# Patient Record
Sex: Male | Born: 1955 | Race: White | Hispanic: No | Marital: Married | Smoking: Former smoker
Health system: Southern US, Community
[De-identification: ages and names within clinical notes are randomized; demographics above are authoritative.]

## PROBLEM LIST (undated history)

## (undated) DIAGNOSIS — A020 Salmonella enteritis: Secondary | ICD-10-CM

## (undated) DIAGNOSIS — M1990 Unspecified osteoarthritis, unspecified site: Secondary | ICD-10-CM

## (undated) DIAGNOSIS — K635 Polyp of colon: Secondary | ICD-10-CM

## (undated) DIAGNOSIS — E119 Type 2 diabetes mellitus without complications: Secondary | ICD-10-CM

## (undated) DIAGNOSIS — K317 Polyp of stomach and duodenum: Secondary | ICD-10-CM

## (undated) DIAGNOSIS — I340 Nonrheumatic mitral (valve) insufficiency: Secondary | ICD-10-CM

## (undated) DIAGNOSIS — F419 Anxiety disorder, unspecified: Secondary | ICD-10-CM

## (undated) DIAGNOSIS — K648 Other hemorrhoids: Secondary | ICD-10-CM

## (undated) DIAGNOSIS — Z87438 Personal history of other diseases of male genital organs: Secondary | ICD-10-CM

## (undated) DIAGNOSIS — I2510 Atherosclerotic heart disease of native coronary artery without angina pectoris: Secondary | ICD-10-CM

## (undated) DIAGNOSIS — E663 Overweight: Secondary | ICD-10-CM

## (undated) DIAGNOSIS — E785 Hyperlipidemia, unspecified: Secondary | ICD-10-CM

## (undated) DIAGNOSIS — I10 Essential (primary) hypertension: Secondary | ICD-10-CM

## (undated) HISTORY — DX: Polyp of colon: K63.5

## (undated) HISTORY — DX: Polyp of stomach and duodenum: K31.7

## (undated) HISTORY — DX: Salmonella enteritis: A02.0

## (undated) HISTORY — DX: Atherosclerotic heart disease of native coronary artery without angina pectoris: I25.10

## (undated) HISTORY — DX: Overweight: E66.3

## (undated) HISTORY — DX: Anxiety disorder, unspecified: F41.9

## (undated) HISTORY — DX: Unspecified osteoarthritis, unspecified site: M19.90

## (undated) HISTORY — DX: Hyperlipidemia, unspecified: E78.5

## (undated) HISTORY — DX: Nonrheumatic mitral (valve) insufficiency: I34.0

## (undated) HISTORY — DX: Other hemorrhoids: K64.8

## (undated) HISTORY — DX: Personal history of other diseases of male genital organs: Z87.438

---

## 1999-06-25 ENCOUNTER — Ambulatory Visit (HOSPITAL_COMMUNITY): Admission: RE | Admit: 1999-06-25 | Discharge: 1999-06-25 | Payer: Self-pay | Admitting: Internal Medicine

## 2013-09-11 DIAGNOSIS — R935 Abnormal findings on diagnostic imaging of other abdominal regions, including retroperitoneum: Secondary | ICD-10-CM | POA: Diagnosis not present

## 2013-09-11 DIAGNOSIS — R197 Diarrhea, unspecified: Secondary | ICD-10-CM | POA: Diagnosis not present

## 2013-09-11 DIAGNOSIS — R1031 Right lower quadrant pain: Secondary | ICD-10-CM | POA: Diagnosis not present

## 2013-09-14 DIAGNOSIS — D371 Neoplasm of uncertain behavior of stomach: Secondary | ICD-10-CM | POA: Diagnosis not present

## 2013-09-14 DIAGNOSIS — D378 Neoplasm of uncertain behavior of other specified digestive organs: Secondary | ICD-10-CM | POA: Diagnosis not present

## 2013-09-14 DIAGNOSIS — R1031 Right lower quadrant pain: Secondary | ICD-10-CM | POA: Diagnosis not present

## 2013-11-21 DIAGNOSIS — R195 Other fecal abnormalities: Secondary | ICD-10-CM | POA: Diagnosis not present

## 2013-11-21 DIAGNOSIS — D371 Neoplasm of uncertain behavior of stomach: Secondary | ICD-10-CM | POA: Diagnosis not present

## 2013-11-21 DIAGNOSIS — I10 Essential (primary) hypertension: Secondary | ICD-10-CM | POA: Diagnosis not present

## 2013-11-21 DIAGNOSIS — Z79899 Other long term (current) drug therapy: Secondary | ICD-10-CM | POA: Diagnosis not present

## 2013-11-21 DIAGNOSIS — E785 Hyperlipidemia, unspecified: Secondary | ICD-10-CM | POA: Diagnosis not present

## 2013-11-21 DIAGNOSIS — D378 Neoplasm of uncertain behavior of other specified digestive organs: Secondary | ICD-10-CM | POA: Diagnosis not present

## 2013-11-21 DIAGNOSIS — E119 Type 2 diabetes mellitus without complications: Secondary | ICD-10-CM | POA: Diagnosis not present

## 2014-04-24 DIAGNOSIS — Z23 Encounter for immunization: Secondary | ICD-10-CM | POA: Diagnosis not present

## 2014-04-24 DIAGNOSIS — M1990 Unspecified osteoarthritis, unspecified site: Secondary | ICD-10-CM | POA: Diagnosis not present

## 2014-04-24 DIAGNOSIS — E119 Type 2 diabetes mellitus without complications: Secondary | ICD-10-CM | POA: Diagnosis not present

## 2014-04-24 DIAGNOSIS — Z79899 Other long term (current) drug therapy: Secondary | ICD-10-CM | POA: Diagnosis not present

## 2014-04-24 DIAGNOSIS — E785 Hyperlipidemia, unspecified: Secondary | ICD-10-CM | POA: Diagnosis not present

## 2014-08-06 DIAGNOSIS — I2510 Atherosclerotic heart disease of native coronary artery without angina pectoris: Secondary | ICD-10-CM

## 2014-08-06 HISTORY — DX: Atherosclerotic heart disease of native coronary artery without angina pectoris: I25.10

## 2014-08-14 ENCOUNTER — Encounter (HOSPITAL_COMMUNITY): Payer: Self-pay | Admitting: Cardiology

## 2014-08-14 ENCOUNTER — Inpatient Hospital Stay (HOSPITAL_COMMUNITY)
Admission: EM | Admit: 2014-08-14 | Discharge: 2014-08-18 | DRG: 247 | Disposition: A | Discharge status: Home / Self Care | Payer: Medicare Other | Attending: Interventional Cardiology | Admitting: Interventional Cardiology

## 2014-08-14 ENCOUNTER — Encounter (HOSPITAL_COMMUNITY)
Admission: EM | Disposition: A | Discharge status: Home / Self Care | Payer: Self-pay | Source: Home / Self Care | Attending: Interventional Cardiology

## 2014-08-14 DIAGNOSIS — I10 Essential (primary) hypertension: Secondary | ICD-10-CM | POA: Diagnosis present

## 2014-08-14 DIAGNOSIS — I213 ST elevation (STEMI) myocardial infarction of unspecified site: Secondary | ICD-10-CM | POA: Diagnosis not present

## 2014-08-14 DIAGNOSIS — I2119 ST elevation (STEMI) myocardial infarction involving other coronary artery of inferior wall: Secondary | ICD-10-CM | POA: Diagnosis not present

## 2014-08-14 DIAGNOSIS — I2111 ST elevation (STEMI) myocardial infarction involving right coronary artery: Secondary | ICD-10-CM

## 2014-08-14 DIAGNOSIS — I2510 Atherosclerotic heart disease of native coronary artery without angina pectoris: Secondary | ICD-10-CM | POA: Diagnosis present

## 2014-08-14 DIAGNOSIS — M25511 Pain in right shoulder: Secondary | ICD-10-CM | POA: Diagnosis present

## 2014-08-14 DIAGNOSIS — R05 Cough: Secondary | ICD-10-CM | POA: Diagnosis present

## 2014-08-14 DIAGNOSIS — Z87891 Personal history of nicotine dependence: Secondary | ICD-10-CM

## 2014-08-14 DIAGNOSIS — M1990 Unspecified osteoarthritis, unspecified site: Secondary | ICD-10-CM | POA: Diagnosis present

## 2014-08-14 DIAGNOSIS — I959 Hypotension, unspecified: Secondary | ICD-10-CM | POA: Diagnosis not present

## 2014-08-14 DIAGNOSIS — N179 Acute kidney failure, unspecified: Secondary | ICD-10-CM | POA: Diagnosis not present

## 2014-08-14 DIAGNOSIS — R7309 Other abnormal glucose: Secondary | ICD-10-CM | POA: Diagnosis present

## 2014-08-14 DIAGNOSIS — I340 Nonrheumatic mitral (valve) insufficiency: Secondary | ICD-10-CM | POA: Diagnosis present

## 2014-08-14 DIAGNOSIS — I441 Atrioventricular block, second degree: Secondary | ICD-10-CM | POA: Diagnosis not present

## 2014-08-14 DIAGNOSIS — E785 Hyperlipidemia, unspecified: Secondary | ICD-10-CM

## 2014-08-14 DIAGNOSIS — R079 Chest pain, unspecified: Secondary | ICD-10-CM | POA: Diagnosis not present

## 2014-08-14 DIAGNOSIS — I2582 Chronic total occlusion of coronary artery: Secondary | ICD-10-CM | POA: Diagnosis present

## 2014-08-14 DIAGNOSIS — E7800 Pure hypercholesterolemia, unspecified: Secondary | ICD-10-CM

## 2014-08-14 DIAGNOSIS — Z23 Encounter for immunization: Secondary | ICD-10-CM

## 2014-08-14 DIAGNOSIS — R059 Cough, unspecified: Secondary | ICD-10-CM

## 2014-08-14 DIAGNOSIS — G8929 Other chronic pain: Secondary | ICD-10-CM | POA: Diagnosis present

## 2014-08-14 DIAGNOSIS — I5031 Acute diastolic (congestive) heart failure: Secondary | ICD-10-CM | POA: Diagnosis not present

## 2014-08-14 DIAGNOSIS — Z955 Presence of coronary angioplasty implant and graft: Secondary | ICD-10-CM

## 2014-08-14 HISTORY — DX: Essential (primary) hypertension: I10

## 2014-08-14 HISTORY — DX: Unspecified osteoarthritis, unspecified site: M19.90

## 2014-08-14 HISTORY — PX: LEFT HEART CATHETERIZATION WITH CORONARY ANGIOGRAM: SHX5451

## 2014-08-14 HISTORY — DX: Type 2 diabetes mellitus without complications: E11.9

## 2014-08-14 LAB — PROTIME-INR
INR: 1.11 (ref 0.00–1.49)
Prothrombin Time: 14.5 seconds (ref 11.6–15.2)

## 2014-08-14 LAB — COMPREHENSIVE METABOLIC PANEL
ALBUMIN: 3.7 g/dL (ref 3.5–5.2)
ALK PHOS: 44 U/L (ref 39–117)
ALT: 64 U/L — AB (ref 0–53)
AST: 148 U/L — ABNORMAL HIGH (ref 0–37)
Anion gap: 17 — ABNORMAL HIGH (ref 5–15)
BUN: 26 mg/dL — ABNORMAL HIGH (ref 6–23)
CO2: 26 mEq/L (ref 19–32)
Calcium: 9.8 mg/dL (ref 8.4–10.5)
Chloride: 91 mEq/L — ABNORMAL LOW (ref 96–112)
Creatinine, Ser: 1.72 mg/dL — ABNORMAL HIGH (ref 0.50–1.35)
GFR calc Af Amer: 48 mL/min — ABNORMAL LOW (ref >90)
GFR calc non Af Amer: 42 mL/min — ABNORMAL LOW (ref >90)
Glucose, Bld: 318 mg/dL — ABNORMAL HIGH (ref 70–99)
POTASSIUM: 3.9 meq/L (ref 3.7–5.3)
Sodium: 134 mEq/L — ABNORMAL LOW (ref 137–147)
TOTAL PROTEIN: 6.8 g/dL (ref 6.0–8.3)
Total Bilirubin: 0.5 mg/dL (ref 0.3–1.2)

## 2014-08-14 LAB — CBC WITH DIFFERENTIAL/PLATELET
BASOS ABS: 0 10*3/uL (ref 0.0–0.1)
Basophils Relative: 0 % (ref 0–1)
EOS ABS: 0 10*3/uL (ref 0.0–0.7)
EOS PCT: 0 % (ref 0–5)
HCT: 39.1 % (ref 39.0–52.0)
Hemoglobin: 13 g/dL (ref 13.0–17.0)
LYMPHS ABS: 1.9 10*3/uL (ref 0.7–4.0)
Lymphocytes Relative: 13 % (ref 12–46)
MCH: 30.2 pg (ref 26.0–34.0)
MCHC: 33.2 g/dL (ref 30.0–36.0)
MCV: 90.7 fL (ref 78.0–100.0)
Monocytes Absolute: 1.5 10*3/uL — ABNORMAL HIGH (ref 0.1–1.0)
Monocytes Relative: 11 % (ref 3–12)
Neutro Abs: 11 10*3/uL — ABNORMAL HIGH (ref 1.7–7.7)
Neutrophils Relative %: 76 % (ref 43–77)
PLATELETS: 227 10*3/uL (ref 150–400)
RBC: 4.31 MIL/uL (ref 4.22–5.81)
RDW: 13.1 % (ref 11.5–15.5)
WBC: 14.5 10*3/uL — ABNORMAL HIGH (ref 4.0–10.5)

## 2014-08-14 LAB — GLUCOSE, CAPILLARY
GLUCOSE-CAPILLARY: 180 mg/dL — AB (ref 70–99)
Glucose-Capillary: 164 mg/dL — ABNORMAL HIGH (ref 70–99)

## 2014-08-14 LAB — POCT I-STAT TROPONIN I: Troponin i, poc: 13.27 ng/mL (ref 0.00–0.08)

## 2014-08-14 LAB — MRSA PCR SCREENING: MRSA by PCR: NEGATIVE

## 2014-08-14 LAB — TROPONIN I

## 2014-08-14 LAB — POCT ACTIVATED CLOTTING TIME: Activated Clotting Time: 484 seconds

## 2014-08-14 SURGERY — LEFT HEART CATHETERIZATION WITH CORONARY ANGIOGRAM
Anesthesia: LOCAL

## 2014-08-14 MED ORDER — ASPIRIN 325 MG PO TABS
ORAL_TABLET | ORAL | Status: AC
  Filled 2014-08-14: qty 1

## 2014-08-14 MED ORDER — ASPIRIN 300 MG RE SUPP
300.0000 mg | RECTAL | Status: AC
  Filled 2014-08-14: qty 1

## 2014-08-14 MED ORDER — ATORVASTATIN CALCIUM 40 MG PO TABS
40.0000 mg | ORAL_TABLET | Freq: Every day | ORAL | Status: DC
  Administered 2014-08-14: 40 mg via ORAL
  Filled 2014-08-14 (×2): qty 1

## 2014-08-14 MED ORDER — ALUM & MAG HYDROXIDE-SIMETH 200-200-20 MG/5ML PO SUSP
30.0000 mL | ORAL | Status: DC | PRN
  Administered 2014-08-14 – 2014-08-17 (×5): 30 mL via ORAL
  Filled 2014-08-14 (×6): qty 30

## 2014-08-14 MED ORDER — ASPIRIN EC 81 MG PO TBEC
81.0000 mg | DELAYED_RELEASE_TABLET | Freq: Every day | ORAL | Status: DC
  Administered 2014-08-15 – 2014-08-18 (×4): 81 mg via ORAL
  Filled 2014-08-14 (×4): qty 1

## 2014-08-14 MED ORDER — NITROGLYCERIN 0.4 MG SL SUBL: 0.4000 mg | SUBLINGUAL_TABLET | SUBLINGUAL | Status: DC | PRN

## 2014-08-14 MED ORDER — BIVALIRUDIN 250 MG IV SOLR
INTRAVENOUS | Status: AC
  Filled 2014-08-14: qty 250

## 2014-08-14 MED ORDER — ACETAMINOPHEN 325 MG PO TABS: 650.0000 mg | ORAL_TABLET | ORAL | Status: DC | PRN

## 2014-08-14 MED ORDER — VERAPAMIL HCL 2.5 MG/ML IV SOLN
INTRAVENOUS | Status: AC
  Filled 2014-08-14: qty 2

## 2014-08-14 MED ORDER — LIDOCAINE HCL (PF) 1 % IJ SOLN
INTRAMUSCULAR | Status: AC
  Filled 2014-08-14: qty 30

## 2014-08-14 MED ORDER — HEPARIN SODIUM (PORCINE) 1000 UNIT/ML IJ SOLN
INTRAMUSCULAR | Status: AC
  Filled 2014-08-14: qty 1

## 2014-08-14 MED ORDER — HEPARIN (PORCINE) IN NACL 2-0.9 UNIT/ML-% IJ SOLN
INTRAMUSCULAR | Status: AC
  Filled 2014-08-14: qty 1500

## 2014-08-14 MED ORDER — HEPARIN SODIUM (PORCINE) 1000 UNIT/ML IJ SOLN: 4000.0000 [IU] | Freq: Once | INTRAMUSCULAR | Status: DC

## 2014-08-14 MED ORDER — TICAGRELOR 90 MG PO TABS
90.0000 mg | ORAL_TABLET | Freq: Two times a day (BID) | ORAL | Status: DC
  Administered 2014-08-15 – 2014-08-18 (×7): 90 mg via ORAL
  Filled 2014-08-14 (×8): qty 1

## 2014-08-14 MED ORDER — ASPIRIN 81 MG PO CHEW
CHEWABLE_TABLET | ORAL | Status: AC
  Filled 2014-08-14: qty 4

## 2014-08-14 MED ORDER — SODIUM CHLORIDE 0.9 % IV SOLN
INTRAVENOUS | Status: AC
  Administered 2014-08-14: 100 mL/h via INTRAVENOUS
  Administered 2014-08-14: 23:00:00 via INTRAVENOUS

## 2014-08-14 MED ORDER — ALPRAZOLAM 0.25 MG PO TABS
0.2500 mg | ORAL_TABLET | Freq: Two times a day (BID) | ORAL | Status: DC | PRN
  Administered 2014-08-15 (×2): 0.25 mg via ORAL
  Filled 2014-08-14 (×2): qty 1

## 2014-08-14 MED ORDER — ONDANSETRON HCL 4 MG/2ML IJ SOLN: 4.0000 mg | Freq: Four times a day (QID) | INTRAMUSCULAR | Status: DC | PRN

## 2014-08-14 MED ORDER — INSULIN ASPART 100 UNIT/ML ~~LOC~~ SOLN
0.0000 [IU] | Freq: Every day | SUBCUTANEOUS | Status: DC
Start: 2014-08-14 — End: 2014-08-18

## 2014-08-14 MED ORDER — ZOLPIDEM TARTRATE 5 MG PO TABS
5.0000 mg | ORAL_TABLET | Freq: Every evening | ORAL | Status: DC | PRN
  Administered 2014-08-16 – 2014-08-17 (×2): 5 mg via ORAL
  Filled 2014-08-14 (×2): qty 1

## 2014-08-14 MED ORDER — TRAMADOL HCL 50 MG PO TABS
50.0000 mg | ORAL_TABLET | Freq: Four times a day (QID) | ORAL | Status: DC | PRN
  Administered 2014-08-15 – 2014-08-17 (×5): 50 mg via ORAL
  Filled 2014-08-14 (×5): qty 1

## 2014-08-14 MED ORDER — ASPIRIN 81 MG PO CHEW: 324.0000 mg | CHEWABLE_TABLET | ORAL | Status: AC

## 2014-08-14 MED ORDER — SODIUM CHLORIDE 0.9 % IV SOLN
0.2500 mg/kg/h | INTRAVENOUS | Status: AC
  Administered 2014-08-14: 0.25 mg/kg/h via INTRAVENOUS

## 2014-08-14 MED ORDER — TICAGRELOR 90 MG PO TABS
ORAL_TABLET | ORAL | Status: AC
  Filled 2014-08-14: qty 2

## 2014-08-14 MED ORDER — PANTOPRAZOLE SODIUM 40 MG PO TBEC
40.0000 mg | DELAYED_RELEASE_TABLET | Freq: Every day | ORAL | Status: DC
  Administered 2014-08-15 – 2014-08-18 (×4): 40 mg via ORAL
  Filled 2014-08-14 (×4): qty 1

## 2014-08-14 MED ORDER — HEPARIN SODIUM (PORCINE) 5000 UNIT/ML IJ SOLN
INTRAMUSCULAR | Status: AC
  Filled 2014-08-14: qty 1

## 2014-08-14 MED ORDER — NITROGLYCERIN 1 MG/10 ML FOR IR/CATH LAB
INTRA_ARTERIAL | Status: AC
  Filled 2014-08-14: qty 10

## 2014-08-14 NOTE — CV Procedure (Signed)
Cardiac Catheterization Procedure Note  Name: Karl Alvarez MRN: [id number] DOB: 14-May-1956  Procedure: Left Heart Cath, Selective Coronary Angiography, LV angiography, PTCA and stenting of the midright coronary artery  Indication: Inferior ST elevation myocardial infarction  Medications:  Sedation:  0 mg IV Versed, 0 mcg IV Fentanyl  Contrast:  120 mL Omnipaque  Procedural Details: The right wrist was prepped, draped, and anesthetized with 1% lidocaine. Using the modified Seldinger technique, a 5 French Slender sheath was introduced into the right radial artery. 3 mg of verapamil was administered through the sheath, weight-based unfractionated heparin was administered intravenously. A Jackie catheter was used for selective coronary angiography. A pigtail catheter was used for left ventriculography. Catheter exchanges were performed over an exchange length guidewire. There were no immediate procedural complications.  Procedural Findings:  Hemodynamics: AO:  84/50   mmHg LV:  84/10    mmHg LVEDP: 20  mmHg  Coronary angiography: Coronary dominance: Right   Left Main:  Normal  Left Anterior Descending (LAD):  Normal in size with 20% mid stenosis.  1st diagonal (D1):  Large in size with minor irregularities.  2nd diagonal (D2):  Very small in size.  3rd diagonal (D3):  Very small in size.  Circumflex (LCx):  Normal in size and nondominant. The vessel has minor irregularities.  1st obtuse marginal:  Normal in size with no significant disease.  2nd obtuse marginal:  Normal in size with no significant disease.  3rd obtuse marginal:  Small in size with no significant disease.    Right Coronary Artery: Large in size and dominant. The vessel is occluded in the midsegment with TIMI 0 flow. There is 30% disease in the distal segment. .   Posterior descending artery: Medium in size with minor irregularities.  AV segment: Normal. Normal posterolateral branches.   Left  ventriculography: Left ventricular systolic function is normal , LVEF is estimated at 55 %, there is no significant mitral regurgitation . Mild basal inferior wall hypokinesis   PCI Note:  Following the diagnostic procedure, the decision was made to proceed with PCI.  Weight-based bivalirudin was given for anticoagulation. Once a therapeutic ACT was achieved, a 6 [place] and JR4de catheter was inserted.  A run through coronary guidewire was used to cross the lesion.  The lesion was predilated with a 2.5 x 12 balloon.  The lesion was then stented with a 3.0 x 28 mm Promus drug-eluting stent 3.25 x 20 mm.  The stent was postdilated with a 3.25 x 20 mm noncompliant balloon.  Following PCI, there was 0% residual stenosis and TIMI-3 flow. Final angiography confirmed an excellent result. The patient tolerated the procedure well. There were no immediate procedural complications. A TR band was used for radial hemostasis. The patient was transferred to the post catheterization recovery area for further monitoring.  PCI Data: Vessel - RCA: Segment  - mid Percent Stenosis (pre)  100% TIMI-flow 0 Stent : 3.0 X 28 mm Promus DES Percent Stenosis (post) : 0% TIMI-flow (post) : 3   Final Conclusions:  1. 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 No obstructive disease otherwise. 2. Normal LV systolic function with mild to moderate elevation of left ventricular end-diastolic pressure.  3. Successful angioplasty and drug-eluting stent placement to the mid right coronary artery.   Recommendations:   the patient was in second-degree 2-1 AV block on presentation with a heart rate in the 40s. This resolved after PCI but he  was noted to have Mobitz 1 second-degree AV block with heart rate in the 50s and 60s. No indication for pacemaker at the present time. Avoid beta blockers for now. Blood pressure is still low. Continue IV fluids. Continue dual  antiplatelet therapy for at least one year. Aggressive treatment of risk factors recommended.   Kathlyn Sacramento MD, [facility] 08/14/2014, 4:57 PM

## 2014-08-14 NOTE — ED Provider Notes (Signed)
CSN: [id number]     Arrival date & time 08/14/14  1518 History   First MD Initiated Contact with Patient 08/14/14 1529     Chief Complaint  Patient presents with  . Chest Pain     (Consider location/radiation/quality/duration/timing/severity/associated sxs/prior Treatment) Patient is a 58 y.o. male presenting with chest pain.  Chest Pain Pain location:  Substernal area Pain quality: pressure and tightness   Pain radiates to:  L shoulder Pain radiates to the back: no   Pain severity:  Moderate Onset quality:  Gradual Duration:  2 days Timing:  Constant Progression:  Worsening Chronicity:  Recurrent Context: at rest   Relieved by:  Nothing Worsened by:  Exertion and movement Associated symptoms: nausea   Associated symptoms: no abdominal pain, no cough, no shortness of breath and not vomiting     Past Medical History  Diagnosis Date  . HTN (hypertension)   . DJD (degenerative joint disease)   . Diabetes     diet controlled   No past surgical history on file. Family History  Problem Relation Age of Onset  .      History  Substance Use Topics  . Smoking status: Former Research scientist (life sciences)  . Smokeless tobacco: Former Systems developer    Quit date: 08/14/1974  . Alcohol Use: Not on file    Review of Systems  Respiratory: Negative for cough and shortness of breath.   Cardiovascular: Positive for chest pain.  Gastrointestinal: Positive for nausea. Negative for vomiting and abdominal pain.  All other systems reviewed and are negative.     Allergies  Review of patient's allergies indicates not on file.  Home Medications   Prior to Admission medications   Medication Sig Start Date End Date Taking? Authorizing Provider  atenolol (TENORMIN) 50 MG tablet Take 50 mg by mouth daily.   Yes Historical Provider, MD  HYDROcodone-acetaminophen (NORCO/VICODIN) 5-325 MG per tablet Take 1 tablet by mouth every 6 (six) hours as needed for moderate pain.   Yes Historical Provider, MD  indomethacin  (INDOCIN) 50 MG capsule Take 50 mg by mouth 2 (two) times daily with a meal.   Yes Historical Provider, MD   BP 114/68 mmHg  Pulse 51  Temp(Src) 98.1 F (36.7 C) (Oral)  Resp 17  Ht 5\' 8"  (1.727 m)  SpO2 96% Physical Exam  Constitutional: He is oriented to person, place, and time. He appears well-developed and well-nourished.  HENT:  Head: Normocephalic and atraumatic.  Eyes: Conjunctivae and EOM are normal.  Neck: Normal range of motion. Neck supple.  Cardiovascular: Normal rate, regular rhythm and normal heart sounds.   Pulmonary/Chest: Effort normal and breath sounds normal. No respiratory distress.  Abdominal: He exhibits no distension. There is no tenderness. There is no rebound and no guarding.  Musculoskeletal: Normal range of motion.  Neurological: He is alert and oriented to person, place, and time.  Skin: Skin is warm and dry.  Vitals reviewed.   ED Course  Procedures (including critical care time) Labs Review Labs Reviewed  CBC WITH DIFFERENTIAL - Abnormal; Notable for the following:    WBC 14.5 (*)    Neutro Abs 11.0 (*)    Monocytes Absolute 1.5 (*)    All other components within normal limits  COMPREHENSIVE METABOLIC PANEL - Abnormal; Notable for the following:    Sodium 134 (*)    Chloride 91 (*)    Glucose, Bld 318 (*)    BUN 26 (*)    Creatinine, Ser 1.72 (*)  AST 148 (*)    ALT 64 (*)    GFR calc non Af Amer 42 (*)    GFR calc Af Amer 48 (*)    Anion gap 17 (*)    All other components within normal limits  POCT I-STAT TROPONIN I - Abnormal; Notable for the following:    Troponin i, poc 13.27 (*)    All other components within normal limits  PROTIME-INR  TROPONIN I  TROPONIN I  TROPONIN I  HEMOGLOBIN A1C  POCT ACTIVATED CLOTTING TIME    Imaging Review No results found.   EKG Interpretation   Date/Time:  Wednesday August 14 2014 15:23:49 EST Ventricular Rate:  41 PR Interval:  240 QRS Duration: 94 QT Interval:  448 QTC  Calculation: 369 R Axis:   45 Text Interpretation:  Critical Test Result: STEMI Marked sinus  bradycardia with 1st degree A-V block with Fusion complexes  Inferior-posterior infarct , possibly acute ** ** ACUTE MI / STEMI ** **  Consider right ventricular involvement in acute inferior infarct Abnormal  ECG No old tracing to compare Code stemi called Confirmed by Debby Freiberg [phone]) on 08/14/2014 3:36:32 PM      MDM   Final diagnoses:  ST elevation myocardial infarction involving right coronary artery    58 y.o. male with pertinent PMH of  presents with substernal chest pain as described above.  ECG with STE in inferior leads with ST depression in AVL, not in other lateral leads.  Code stemi called.   Pt states he has a ho prior ECG abnormalities, but cannot specify what.  Cardiology evaluated pt and he was taken to the cath lab emergently.  1. ST elevation myocardial infarction involving right coronary artery         Debby Freiberg, MD 08/14/14 [phone]

## 2014-08-14 NOTE — H&P (Signed)
Patient ID: Karl Alvarez MRN: [id number], DOB/AGE: [birth year]   Admit date: 08/14/2014   Primary Physician: No primary care provider on file. Primary Cardiologist: Dr Bettina Gavia  HPI: 58 y/o male from [place] with no prior history of CAD. He does have a history of an "abnormal EKG" and had a stress test 5 yrs ago that was OK per the pt. He has never had a cath. 48 hrs ago he noted intermittent SSCP with radiation to his Lt shoulder. Pain was intermittent. At times he was diaphoretic. This am symptoms worsened and were associated with near syncope and weakness. He is seen now in the ER with "1/10" pain. His EKG shows 2:1 AVB with HR of 40. He has inferior ST elevation with lateral TWI.   PMH HTN Borderline DM DJD- chronic Rt shoulder pain  FM Hx: negative for CAD  Allergies: NKDA   Home Medications Current Facility-Administered Medications  Medication Dose Route Frequency Provider Last Rate Last Dose  . heparin 5000 UNIT/ML injection           . [MAR Hold] heparin injection 4,000 Units  4,000 Units Intravenous Once Sinclair Grooms, MD      Atenolol Hydrocodone   History   Social History  . Marital Status: Married    Spouse Name: N/A    Number of Children: N/A  . Years of Education: N/A   Occupational History  . Not on file.   Social History Main Topics  . Smoking status: Not on file  . Smokeless tobacco: Not on file  . Alcohol Use: Not on file  . Drug Use: Not on file  . Sexual Activity: Not on file   Other Topics Concern  . Not on file   Social History Narrative  . No narrative on file     Review of Systems: General: negative for chills, fever, night sweats or weight changes.  Cardiovascular: negative for chest pain, dyspnea on exertion, edema, orthopnea, palpitations, paroxysmal nocturnal dyspnea or shortness of breath Dermatological: negative for rash Respiratory: negative for cough or wheezing Urologic: negative for hematuria Abdominal:  negative for nausea, vomiting, diarrhea, bright red blood per rectum, melena, or hematemesis Neurologic: negative for visual changes, syncope, or dizziness All other systems reviewed and are otherwise negative except as noted above.  Physical Exam: Blood pressure 114/68, pulse 51, temperature 98.1 F (36.7 C), temperature source Oral, resp. rate 17, height 5\' 8"  (1.727 m), SpO2 96 %.  General appearance: alert, cooperative, no distress and pale Neck: no carotid bruit and no JVD Lungs: clear to auscultation bilaterally Heart: regular rate and rhythm Abdomen: soft, non-tender; bowel sounds normal; no masses,  no organomegaly Extremities: extremities normal, atraumatic, no cyanosis or edema Pulses: 2+ and symmetric Skin: Skin color, texture, turgor normal. No rashes or lesions, pale dry Neurologic: Grossly normal    Labs:   Results for orders placed or performed during the hospital encounter of 08/14/14 (from the past 24 hour(s))  CBC with Differential     Status: Abnormal   Collection Time: 08/14/14  3:32 PM  Result Value Ref Range   WBC 14.5 (H) 4.0 - 10.5 K/uL   RBC 4.31 4.22 - 5.81 MIL/uL   Hemoglobin 13.0 13.0 - 17.0 g/dL   HCT 39.1 39.0 - 52.0 %   MCV 90.7 78.0 - 100.0 fL   MCH 30.2 26.0 - 34.0 pg   MCHC 33.2 30.0 - 36.0 g/dL   RDW 13.1 11.5 - 15.5 %  Platelets 227 150 - 400 K/uL   Neutrophils Relative % 76 43 - 77 %   Neutro Abs 11.0 (H) 1.7 - 7.7 K/uL   Lymphocytes Relative 13 12 - 46 %   Lymphs Abs 1.9 0.7 - 4.0 K/uL   Monocytes Relative 11 3 - 12 %   Monocytes Absolute 1.5 (H) 0.1 - 1.0 K/uL   Eosinophils Relative 0 0 - 5 %   Eosinophils Absolute 0.0 0.0 - 0.7 K/uL   Basophils Relative 0 0 - 1 %   Basophils Absolute 0.0 0.0 - 0.1 K/uL     Radiology/Studies: No results found.  EKG:2:1 AVB with inferior ST elevation and lateral TWI  ASSESSMENT AND PLAN:  Principal Problem:   STEMI (ST elevation myocardial infarction) Active Problems:   Second degree AV  block   HTN (hypertension)   PLAN: Urgent cath   Henri Medal, PA-C 08/14/2014, 3:48 PM

## 2014-08-14 NOTE — ED Notes (Signed)
Patient belongings and wallet given to his wife.

## 2014-08-15 ENCOUNTER — Encounter (HOSPITAL_COMMUNITY): Payer: Self-pay | Admitting: Cardiovascular Disease

## 2014-08-15 DIAGNOSIS — I441 Atrioventricular block, second degree: Secondary | ICD-10-CM

## 2014-08-15 DIAGNOSIS — I959 Hypotension, unspecified: Secondary | ICD-10-CM

## 2014-08-15 DIAGNOSIS — I2111 ST elevation (STEMI) myocardial infarction involving right coronary artery: Secondary | ICD-10-CM

## 2014-08-15 LAB — CBC
HCT: 33.8 % — ABNORMAL LOW (ref 39.0–52.0)
Hemoglobin: 11.5 g/dL — ABNORMAL LOW (ref 13.0–17.0)
MCH: 30.4 pg (ref 26.0–34.0)
MCHC: 34 g/dL (ref 30.0–36.0)
MCV: 89.4 fL (ref 78.0–100.0)
PLATELETS: 184 10*3/uL (ref 150–400)
RBC: 3.78 MIL/uL — ABNORMAL LOW (ref 4.22–5.81)
RDW: 13 % (ref 11.5–15.5)
WBC: 12.7 10*3/uL — AB (ref 4.0–10.5)

## 2014-08-15 LAB — LIPID PANEL
CHOL/HDL RATIO: 5.5 ratio
Cholesterol: 165 mg/dL (ref 0–200)
HDL: 30 mg/dL — AB (ref >39)
LDL CALC: 95 mg/dL (ref 0–99)
Triglycerides: 198 mg/dL — ABNORMAL HIGH (ref <150)
VLDL: 40 mg/dL (ref 0–40)

## 2014-08-15 LAB — BASIC METABOLIC PANEL
Anion gap: 15 (ref 5–15)
BUN: 24 mg/dL — AB (ref 6–23)
CO2: 24 meq/L (ref 19–32)
CREATININE: 1.21 mg/dL (ref 0.50–1.35)
Calcium: 9.1 mg/dL (ref 8.4–10.5)
Chloride: 93 mEq/L — ABNORMAL LOW (ref 96–112)
GFR calc Af Amer: 74 mL/min — ABNORMAL LOW (ref >90)
GFR calc non Af Amer: 64 mL/min — ABNORMAL LOW (ref >90)
Glucose, Bld: 205 mg/dL — ABNORMAL HIGH (ref 70–99)
Potassium: 3.8 mEq/L (ref 3.7–5.3)
Sodium: 132 mEq/L — ABNORMAL LOW (ref 137–147)

## 2014-08-15 LAB — TROPONIN I: Troponin I: >20 ng/mL (ref <0.30)

## 2014-08-15 LAB — GLUCOSE, CAPILLARY
Glucose-Capillary: 193 mg/dL — ABNORMAL HIGH (ref 70–99)
Glucose-Capillary: 195 mg/dL — ABNORMAL HIGH (ref 70–99)
Glucose-Capillary: 192 mg/dL — ABNORMAL HIGH (ref 70–99)
Glucose-Capillary: 185 mg/dL — ABNORMAL HIGH (ref 70–99)

## 2014-08-15 LAB — HEMOGLOBIN A1C
HEMOGLOBIN A1C: 7.7 % — AB (ref <5.7)
Mean Plasma Glucose: 174 mg/dL — ABNORMAL HIGH (ref <117)

## 2014-08-15 MED ORDER — HYDROCODONE-ACETAMINOPHEN 5-325 MG PO TABS
1.0000 | ORAL_TABLET | Freq: Four times a day (QID) | ORAL | Status: DC | PRN
  Administered 2014-08-15 (×2): 1 via ORAL
  Filled 2014-08-15 (×3): qty 1

## 2014-08-15 MED ORDER — ATORVASTATIN CALCIUM 80 MG PO TABS
80.0000 mg | ORAL_TABLET | Freq: Every day | ORAL | Status: DC
Start: 2014-08-15 — End: 2014-08-18
  Administered 2014-08-15 – 2014-08-17 (×3): 80 mg via ORAL
  Filled 2014-08-15 (×4): qty 1

## 2014-08-15 MED ORDER — INSULIN ASPART 100 UNIT/ML ~~LOC~~ SOLN
0.0000 [IU] | Freq: Three times a day (TID) | SUBCUTANEOUS | Status: DC
  Administered 2014-08-15 – 2014-08-17 (×8): 2 [IU] via SUBCUTANEOUS
  Administered 2014-08-18: 1 [IU] via SUBCUTANEOUS

## 2014-08-15 MED ORDER — SODIUM CHLORIDE 0.9 % IV SOLN
INTRAVENOUS | Status: DC
  Administered 2014-08-15: 100 mL/h via INTRAVENOUS

## 2014-08-15 MED ORDER — INSULIN ASPART 100 UNIT/ML ~~LOC~~ SOLN: 0.0000 [IU] | Freq: Three times a day (TID) | SUBCUTANEOUS | Status: DC

## 2014-08-15 MED FILL — Sodium Chloride IV Soln 0.9%: INTRAVENOUS | Qty: 50 | Status: AC

## 2014-08-15 NOTE — Progress Notes (Signed)
CARDIAC REHAB PHASE I   PRE:  Rate/Rhythm: 53 2nd degree HB Type II  BP:  Supine: 93/58  Sitting: 109/59  Standing:    SaO2: 98 2L 98 RA  MODE:  Ambulation: to chair ft   POST:  Rate/Rhythm: 60 2nd degree Type II  BP:  Supine:   Sitting: 89/46  Standing:    SaO2: 98 RA 1000-1050 On arrival pt in bed, c/o of feeling bad. He c/o of no sleep and headache. Pt was unable to make a decision if he wanted to sleep or try to get to chair.Pt admits that he has a lot of problems sleeping at home, sleeps one out of three nights. Sat pt on the side of bed and checked his BP it was better with sitting. His HR increased with sitting. Assisted pt to recliner, he tolerated walk there well without c/o. Pt in recliner with mother present and he was trying to eat breakfast. I left pt information booklet on the hospital. MI and stent booklets.We will continue to follow pt. Rodney Langton RN 08/15/2014 10:57 AM

## 2014-08-15 NOTE — Progress Notes (Signed)
Rhonda Barrett PAc notified of pt's request for home med of Hydrocodone for chronic pain. Order received

## 2014-08-15 NOTE — Progress Notes (Addendum)
Patient Name: Karl Alvarez Date of Encounter: 08/15/2014    SUBJECTIVE: The patient got out of bed without difficulty. He refused to walk with cardiac rehabilitation. He denies chest and shoulder discomfort. He denies nausea, vomiting, headache, and other complaints. His major concern today seems that he was unable to sleep. This myocardial infarction was late presenting if second-degree heart block is a conduction abnormality that resulted from acute occlusion of the RCA. All troponin values have been greater than 20 since admission. This also speaks of late presentation.  TELEMETRY:  Mobitz 1 second-degree heart block this is improved compared with yesterday when he was a fixed 2-1 AV block prior to acute intervention. Filed Vitals:   08/15/14 1100 08/15/14 1102 08/15/14 1200 08/15/14 1300  BP:  164/78 82/47 86/46   Pulse: 58 81 61 45  Temp:   98.7 F (37.1 C)   TempSrc:   Oral   Resp: 14 27 12 15   Height:      Weight:      SpO2: 100% 97% 97% 98%    Intake/Output Summary (Last 24 hours) at 08/15/14 1308 Last data filed at 08/15/14 1200  Gross per 24 hour  Intake 1718.23 ml  Output   1400 ml  Net 318.23 ml   LABS: Basic Metabolic Panel:  Recent Labs  08/14/14 1532 08/15/14 0019  NA 134* 132*  K 3.9 3.8  CL 91* 93*  CO2 26 24  GLUCOSE 318* 205*  BUN 26* 24*  CREATININE 1.72* 1.21  CALCIUM 9.8 9.1   CBC:  Recent Labs  08/14/14 1532 08/15/14 0019  WBC 14.5* 12.7*  NEUTROABS 11.0*  --   HGB 13.0 11.5*  HCT 39.1 33.8*  MCV 90.7 89.4  PLT 227 184   Cardiac Enzymes:  Recent Labs  08/14/14 1826 08/14/14 2207 08/15/14 0019  TROPONINI >20.00* >20.00* >20.00*   BNP: Invalid input(s): POCBNP Hemoglobin A1C:  Recent Labs  08/14/14 1826  HGBA1C 7.7*   Fasting Lipid Panel:  Recent Labs  08/15/14 0019  CHOL 165  HDL 30*  LDLCALC 95  TRIG 198*  CHOLHDL 5.5    Radiology/Studies:  Was not performed.  Physical Exam: Blood  pressure 86/46, pulse 45, temperature 98.7 F (37.1 C), temperature source Oral, resp. rate 15, height 5\' 8"  (1.727 m), weight 201 lb 15.1 oz (91.6 kg), SpO2 98 %. Weight change:   Wt Readings from Last 3 Encounters:  08/15/14 201 lb 15.1 oz (91.6 kg)   Neck veins are not distended. Chest is clear Cardiac exam reveals irregular rhythm. No rub or murmur is heard.  Extremities reveal no edema Neurological exam reveals a bland affect. No focal deficits are noted.  ECG:  Mobitz 1 second-degree heart block with persistent inferior ST elevation   ASSESSMENT:  1. Late presentation of an acute inferior myocardial infarction complicated by AV conduction abnormality and persistent inferior lead ST elevation despite a great PCI/stent result. This raises a question of inferior wall aneurysm.  2. Possible psychiatric illness  3. Hypotension, possibly related to right ventricular involvement  4. Hyperlipidemia  5. Acute kidney injury with improvement following hydration likely related to find contraction.   Plan:  1. IV hydration  2. We'll keep in the intensive care unit and additional day. He is at risk for myocardial perforation and for high-grade AV block 3. Cardiac rehabilitation 4. Avoid beta blocker therapy until AV conduction abnormality resolves 5. If persistent or sudden hypotension in absence of profound bradycardia,  consider myocardial rupture  Signed, Sinclair Grooms 08/15/2014, 1:08 PM

## 2014-08-15 NOTE — Progress Notes (Signed)
EKG CRITICAL VALUE     12 lead EKG performed.  Critical value noted.  Eustace Pen, RN  notified.   Lazaria Schaben, CCT 08/15/2014 8:22 AM

## 2014-08-15 NOTE — Plan of Care (Signed)
Problem: Phase I Progression Outcomes Goal: Anginal pain relieved Outcome: Completed/Met Date Met:  08/15/14 Goal: Voiding-avoid urinary catheter unless indicated Outcome: Completed/Met Date Met:  08/15/14 Goal: Vascular site scale level 0 - I Vascular Site Scale Level 0: No bruising/bleeding/hematoma Level I (Mild): Bruising/Ecchymosis, minimal bleeding/ooozing, palpable hematoma < 3 cm Level II (Moderate): Bleeding not affecting hemodynamic parameters, pseudoaneurysm, palpable hematoma > 3 cm Level III (Severe) Bleeding which affects hemodynamic parameters or retroperitoneal hemorrhage  Outcome: Completed/Met Date Met:  08/15/14

## 2014-08-16 DIAGNOSIS — I10 Essential (primary) hypertension: Secondary | ICD-10-CM

## 2014-08-16 DIAGNOSIS — I5031 Acute diastolic (congestive) heart failure: Secondary | ICD-10-CM

## 2014-08-16 LAB — BASIC METABOLIC PANEL
ANION GAP: 13 (ref 5–15)
BUN: 19 mg/dL (ref 6–23)
CALCIUM: 9 mg/dL (ref 8.4–10.5)
CO2: 26 meq/L (ref 19–32)
Chloride: 99 mEq/L (ref 96–112)
Creatinine, Ser: 0.99 mg/dL (ref 0.50–1.35)
GFR calc Af Amer: >90 mL/min (ref >90)
GFR calc non Af Amer: 88 mL/min — ABNORMAL LOW (ref >90)
Glucose, Bld: 149 mg/dL — ABNORMAL HIGH (ref 70–99)
Potassium: 4.5 mEq/L (ref 3.7–5.3)
Sodium: 138 mEq/L (ref 137–147)

## 2014-08-16 LAB — GLUCOSE, CAPILLARY
GLUCOSE-CAPILLARY: 151 mg/dL — AB (ref 70–99)
GLUCOSE-CAPILLARY: 187 mg/dL — AB (ref 70–99)
Glucose-Capillary: 152 mg/dL — ABNORMAL HIGH (ref 70–99)
Glucose-Capillary: 157 mg/dL — ABNORMAL HIGH (ref 70–99)
Glucose-Capillary: 161 mg/dL — ABNORMAL HIGH (ref 70–99)

## 2014-08-16 LAB — PRO B NATRIURETIC PEPTIDE: Pro B Natriuretic peptide (BNP): 4568 pg/mL — ABNORMAL HIGH (ref 0–125)

## 2014-08-16 MED ORDER — METOPROLOL TARTRATE 12.5 MG HALF TABLET
12.5000 mg | ORAL_TABLET | Freq: Two times a day (BID) | ORAL | Status: DC
  Administered 2014-08-16: 12.5 mg via ORAL
  Filled 2014-08-16 (×2): qty 1

## 2014-08-16 MED ORDER — PNEUMOCOCCAL VAC POLYVALENT 25 MCG/0.5ML IJ INJ
0.5000 mL | INJECTION | INTRAMUSCULAR | Status: AC
Start: 2014-08-17 — End: 2014-08-17
  Administered 2014-08-17: 0.5 mL via INTRAMUSCULAR
  Filled 2014-08-16 (×2): qty 0.5

## 2014-08-16 MED ORDER — GUAIFENESIN-DM 100-10 MG/5ML PO SYRP
5.0000 mL | ORAL_SOLUTION | ORAL | Status: DC | PRN
  Administered 2014-08-16: 5 mL via ORAL
  Filled 2014-08-16 (×2): qty 5

## 2014-08-16 MED ORDER — METOPROLOL TARTRATE 12.5 MG HALF TABLET
12.5000 mg | ORAL_TABLET | Freq: Two times a day (BID) | ORAL | Status: DC
  Administered 2014-08-17 – 2014-08-18 (×3): 12.5 mg via ORAL
  Filled 2014-08-16 (×3): qty 1

## 2014-08-16 MED ORDER — FUROSEMIDE 10 MG/ML IJ SOLN
20.0000 mg | Freq: Once | INTRAMUSCULAR | Status: AC
  Administered 2014-08-16: 20 mg via INTRAVENOUS
  Filled 2014-08-16: qty 2

## 2014-08-16 NOTE — Plan of Care (Signed)
Problem: Consults Goal: Skin Care Protocol Initiated - if Braden Score 18 or less If consults are not indicated, leave blank or document N/A  Outcome: Not Applicable Date Met:  89/84/21 Goal: Diabetes Guidelines if Diabetic/Glucose > 140 If diabetic or lab glucose is > 140 mg/dl - Initiate Diabetes/Hyperglycemia Guidelines & Document Interventions  Outcome: Progressing

## 2014-08-16 NOTE — Progress Notes (Addendum)
Pt converted to NSR around 2 am 12/11. HR 70-80's, occasional missed beat.

## 2014-08-16 NOTE — Progress Notes (Signed)
       Patient Name: Karl Alvarez Date of Encounter: 08/16/2014    SUBJECTIVE: He feels better today. Still with cough and shortness of breath with deep breathing.  TELEMETRY:  Now in normal sinus rhythm without AV block Filed Vitals:   08/16/14 0800 08/16/14 0900 08/16/14 1000 08/16/14 1100  BP: 100/69 97/64 109/73 98/55  Pulse: 82 84 90 89  Temp:    98.3 F (36.8 C)  TempSrc:    Oral  Resp: 24 23 14 21   Height:      Weight:      SpO2: 94% 97% 96% 93%    Intake/Output Summary (Last 24 hours) at 08/16/14 1150 Last data filed at 08/16/14 1123  Gross per 24 hour  Intake   2820 ml  Output   1950 ml  Net    870 ml   LABS: Basic Metabolic Panel:  Recent Labs  08/15/14 0019 08/16/14 0306  NA 132* 138  K 3.8 4.5  CL 93* 99  CO2 24 26  GLUCOSE 205* 149*  BUN 24* 19  CREATININE 1.21 0.99  CALCIUM 9.1 9.0   CBC:  Recent Labs  08/14/14 1532 08/15/14 0019  WBC 14.5* 12.7*  NEUTROABS 11.0*  --   HGB 13.0 11.5*  HCT 39.1 33.8*  MCV 90.7 89.4  PLT 227 184   Cardiac Enzymes:  Recent Labs  08/14/14 1826 08/14/14 2207 08/15/14 0019  TROPONINI >20.00* >20.00* >20.00*   BNP: Invalid input(s): POCBNP Hemoglobin A1C:  Recent Labs  08/14/14 1826  HGBA1C 7.7*   Fasting Lipid Panel:  Recent Labs  08/15/14 0019  CHOL 165  HDL 30*  LDLCALC 95  TRIG 198*  CHOLHDL 5.5   BNP    Component Value Date/Time   PROBNP 4568.0* 08/16/2014 0306   Radiology/Studies:  No new data  Physical Exam: Blood pressure 98/55, pulse 89, temperature 98.3 F (36.8 C), temperature source Oral, resp. rate 21, height 5\' 8"  (1.727 m), weight 205 lb 11 oz (93.3 kg), SpO2 93 %. Weight change: 10 lb 11 oz (4.849 kg)  Wt Readings from Last 3 Encounters:  08/16/14 205 lb 11 oz (93.3 kg)    Faint basilar crackles Cardiac exam reveals no S4 gallop. No murmur or rub is heard Abdomen is soft Right radial cath site is unremarkable Neurological exam is  normal  ASSESSMENT:  1. Late presenting inferior infarction, K to by second-degree AV block. AV block is now resolved. Patient received a stent to the mid right coronary 2. Since of dyspnea. Rule out acute diastolic heart failure given the markedly elevated BNP 3. Persistent relatively low blood pressures 4. Hyperlipidemia 5. History of hypertension 6. Acute kidney injury, resolved  Plan:  1. Transfer to floor 2. Increase activity 3. Start low-dose beta blocker therapy 4. Possibly eligible for discharge in the next 24-48 hours 5. Chest x-ray 6. Furosemide 40 mg orally  Demetrios Isaacs 08/16/2014, 11:50 AM

## 2014-08-16 NOTE — Plan of Care (Deleted)
Problem: Consults Goal: Diabetes Guidelines if Diabetic/Glucose > 140 If diabetic or lab glucose is > 140 mg/dl - Initiate Diabetes/Hyperglycemia Guidelines & Document Interventions  Outcome: Progressing     

## 2014-08-16 NOTE — Progress Notes (Signed)
CARDIAC REHAB PHASE I   PRE:  Rate/Rhythm: 88 SR    BP: sitting 109/73, standing 121/72    SaO2: 97 1L, 96-100 RA  MODE:  Ambulation: 350 ft   POST:  Rate/Rhythm: 97 SR    BP: sitting 112/60     SaO2: 98 RA  Pt reluctant to walk. Sts he can't get a good breath in due to his stomach bloating. Wanted to wear O2. Assured him that he did not need oxygen and it would be good for him to walk. Pts SOB and bloating feeling did not change with walking. Slow pace.  To recliner. Maintained SR. Began MI ed. Pt continued to come back to his bloating feeling. Sts "someone is going to have to do something." On further questioning pt sts he has had this feeling for over a year and in fact, he was initially told he had appendicitis. Then an oncologist notified him that he thought it was cancer. He wanted to do a colectomy, which the pt declined and seemingly has not had follow up since. Sts he feels sometimes "those things heal themselves, if you just leave it alone". Pt interested in [facility] and will send referral to [place]. [id number]  Josephina Shih Arispe CES, ACSM 08/16/2014 11:14 AM

## 2014-08-17 ENCOUNTER — Inpatient Hospital Stay (HOSPITAL_COMMUNITY): Payer: Medicare Other

## 2014-08-17 DIAGNOSIS — I2119 ST elevation (STEMI) myocardial infarction involving other coronary artery of inferior wall: Secondary | ICD-10-CM | POA: Diagnosis not present

## 2014-08-17 LAB — GLUCOSE, CAPILLARY
Glucose-Capillary: 141 mg/dL — ABNORMAL HIGH (ref 70–99)
Glucose-Capillary: 178 mg/dL — ABNORMAL HIGH (ref 70–99)
Glucose-Capillary: 160 mg/dL — ABNORMAL HIGH (ref 70–99)
Glucose-Capillary: 144 mg/dL — ABNORMAL HIGH (ref 70–99)

## 2014-08-17 NOTE — Progress Notes (Signed)
CARDIAC REHAB PHASE I   PRE:  Rate/Rhythm: 78  BP:  Sitting: 104/69     SaO2: 96% RA  MODE:  Ambulation: 450 ft   POST:  Rate/Rhythm: 85  BP:  Sitting: 95/71     SaO2: 96% RA  11:25am-11:51am  Patient ambulated at a slow and steady pace.  Patient conversated the entire walk.  Patient states that he felt good. Patient is in bed with call bell in reach.    Hal Morales, MS 08/17/2014 11:51 AM

## 2014-08-17 NOTE — Progress Notes (Signed)
*  PRELIMINARY RESULTS* Echocardiogram 2D Echocardiogram has been performed.  Leavy Cella 08/17/2014, 4:33 PM

## 2014-08-17 NOTE — Progress Notes (Addendum)
SUBJECTIVE: The patient is doing well today.  His primary concern is with ongoing cough which causes a headache.  His breathing is "shallow".  At this time, he denies chest pain or any new concerns.  Marland Kitchen aspirin EC  81 mg Oral Daily  . atorvastatin  80 mg Oral q1800  . insulin aspart  0-5 Units Subcutaneous QHS  . insulin aspart  0-9 Units Subcutaneous TID WC  . metoprolol tartrate  12.5 mg Oral BID  . pantoprazole  40 mg Oral Q0600  . pneumococcal 23 valent vaccine  0.5 mL Intramuscular Tomorrow-1000  . ticagrelor  90 mg Oral BID      OBJECTIVE: Physical Exam: Filed Vitals:   08/16/14 1500 08/16/14 1600 08/16/14 2044 08/17/14 0530  BP: 92/59 117/71 98/58 95/44   Pulse: 82 83 86 87  Temp:   98.6 F (37 C) 98.4 F (36.9 C)  TempSrc:   Oral Oral  Resp: 22 18 20 18   Height:      Weight:    195 lb (88.451 kg)  SpO2: 96% 97% 100% 96%    Intake/Output Summary (Last 24 hours) at 08/17/14 0855 Last data filed at 08/16/14 1500  Gross per 24 hour  Intake    240 ml  Output   1550 ml  Net  -1310 ml    Telemetry reveals sinus rhythm with rare, nocturnal second degree AV block  GEN- The patient is well appearing, alert and oriented x 3 today.   Head- normocephalic, atraumatic Eyes-  Sclera clear, conjunctiva pink Ears- hearing intact Oropharynx- clear Neck- supple  Lungs- few basilar rales, normal work of breathing Heart- Regular rate and rhythm, no murmurs, rubs or gallops, PMI not laterally displaced GI- soft, NT, ND, + BS Extremities- no clubbing, cyanosis, or edema Skin- no rash or lesion Psych- euthymic mood, full affect Neuro- strength and sensation are intact  LABS: Basic Metabolic Panel:  Recent Labs  08/15/14 0019 08/16/14 0306  NA 132* 138  K 3.8 4.5  CL 93* 99  CO2 24 26  GLUCOSE 205* 149*  BUN 24* 19  CREATININE 1.21 0.99  CALCIUM 9.1 9.0   Liver Function Tests:  Recent Labs  08/14/14 1532  AST 148*  ALT 64*  ALKPHOS 44  BILITOT 0.5  PROT  6.8  ALBUMIN 3.7   No results for input(s): LIPASE, AMYLASE in the last 72 hours. CBC:  Recent Labs  08/14/14 1532 08/15/14 0019  WBC 14.5* 12.7*  NEUTROABS 11.0*  --   HGB 13.0 11.5*  HCT 39.1 33.8*  MCV 90.7 89.4  PLT 227 184   Cardiac Enzymes:  Recent Labs  08/14/14 1826 08/14/14 2207 08/15/14 0019  TROPONINI >20.00* >20.00* >20.00*   BNP: Invalid input(s): POCBNP D-Dimer: No results for input(s): DDIMER in the last 72 hours. Hemoglobin A1C:  Recent Labs  08/14/14 1826  HGBA1C 7.7*   Fasting Lipid Panel:  Recent Labs  08/15/14 0019  CHOL 165  HDL 30*  LDLCALC 95  TRIG 198*  CHOLHDL 5.5    ASSESSMENT AND PLAN:  Principal Problem:   STEMI (ST elevation myocardial infarction) Active Problems:   HTN (hypertension)   Second degree AV block   1. Late presenting inferior infarction, s/p PCI to the mid right coronary- slowly improving, will obtain an echo 2. Dypsnea and cough- obtain CXR (suggested in Dr Darliss Ridgel notes but not performed) and echo 3. Persistent relatively low blood pressures- limits our ability to treat CAD 4. Hyperlipidemia 5. History of hypertension  6. Acute kidney injury, resolved 7. Second degree AV block- resolving in setting of inferior MI, caution with beta blockers  Will keep in hospital for another 24 hour observation. Ambulate Hopefully to discharge tomorrow.   Thompson Grayer, MD 08/17/2014 8:55 AM

## 2014-08-18 DIAGNOSIS — E7800 Pure hypercholesterolemia, unspecified: Secondary | ICD-10-CM

## 2014-08-18 DIAGNOSIS — N179 Acute kidney failure, unspecified: Secondary | ICD-10-CM

## 2014-08-18 DIAGNOSIS — I959 Hypotension, unspecified: Secondary | ICD-10-CM

## 2014-08-18 DIAGNOSIS — E785 Hyperlipidemia, unspecified: Secondary | ICD-10-CM

## 2014-08-18 HISTORY — DX: Pure hypercholesterolemia, unspecified: E78.00

## 2014-08-18 LAB — GLUCOSE, CAPILLARY: Glucose-Capillary: 146 mg/dL — ABNORMAL HIGH (ref 70–99)

## 2014-08-18 MED ORDER — ATORVASTATIN CALCIUM 80 MG PO TABS: 80.0000 mg | ORAL_TABLET | Freq: Every day | ORAL | Status: DC

## 2014-08-18 MED ORDER — NITROGLYCERIN 0.4 MG SL SUBL: 0.4000 mg | SUBLINGUAL_TABLET | SUBLINGUAL | Status: DC | PRN

## 2014-08-18 MED ORDER — TICAGRELOR 90 MG PO TABS: 90.0000 mg | ORAL_TABLET | Freq: Two times a day (BID) | ORAL | Status: DC

## 2014-08-18 MED ORDER — ASPIRIN 81 MG PO TBEC: 81.0000 mg | DELAYED_RELEASE_TABLET | Freq: Every day | ORAL | Status: DC

## 2014-08-18 MED ORDER — METOPROLOL TARTRATE 25 MG PO TABS: 12.5000 mg | ORAL_TABLET | Freq: Two times a day (BID) | ORAL | Status: DC

## 2014-08-18 NOTE — Progress Notes (Signed)
Subjective: Cough.  SOB which is a little better  Objective: Vital signs in last 24 hours: Temp:  [98.3 F (36.8 C)-99.1 F (37.3 C)] 98.3 F (36.8 C) (12/13 0500) Pulse Rate:  [78-84] 78 (12/13 0500) Resp:  [16-18] 16 (12/13 0500) BP: (93-115)/(48-67) 107/67 mmHg (12/13 0500) SpO2:  [95 %-98 %] 96 % (12/13 0500) Weight:  [192 lb 6.4 oz (87.272 kg)] 192 lb 6.4 oz (87.272 kg) (12/13 0500) Last BM Date: 08/15/14  Intake/Output from previous day: 12/12 0701 - 12/13 0700 In: 480 [P.O.:480] Out: -  Intake/Output this shift:    Medications Current Facility-Administered Medications  Medication Dose Route Frequency Provider Last Rate Last Dose  . acetaminophen (TYLENOL) tablet 650 mg  650 mg Oral Q4H PRN Erlene Quan, PA-C      . ALPRAZolam Duanne Moron) tablet 0.25 mg  0.25 mg Oral BID PRN Erlene Quan, PA-C   0.25 mg at 08/15/14 1452  . alum & mag hydroxide-simeth (MAALOX/MYLANTA) 200-200-20 MG/5ML suspension 30 mL  30 mL Oral Q4H PRN Belva Crome III, MD   30 mL at 08/17/14 1828  . aspirin EC tablet 81 mg  81 mg Oral Daily Erlene Quan, PA-C   81 mg at 08/17/14 0901  . atorvastatin (LIPITOR) tablet 80 mg  80 mg Oral q1800 Belva Crome III, MD   80 mg at 08/17/14 1732  . guaiFENesin-dextromethorphan (ROBITUSSIN DM) 100-10 MG/5ML syrup 5 mL  5 mL Oral Q4H PRN Belva Crome III, MD   5 mL at 08/16/14 2220  . HYDROcodone-acetaminophen (NORCO/VICODIN) 5-325 MG per tablet 1 tablet  1 tablet Oral Q6H PRN Lonn Georgia, PA-C   1 tablet at 08/15/14 2330  . insulin aspart (novoLOG) injection 0-5 Units  0-5 Units Subcutaneous QHS Erlene Quan, PA-C   0 Units at 08/14/14 2117  . insulin aspart (novoLOG) injection 0-9 Units  0-9 Units Subcutaneous TID WC Sinclair Grooms, MD   2 Units at 08/17/14 1731  . metoprolol tartrate (LOPRESSOR) tablet 12.5 mg  12.5 mg Oral BID Belva Crome III, MD   12.5 mg at 08/17/14 2116  . nitroGLYCERIN (NITROSTAT) SL tablet 0.4 mg  0.4 mg Sublingual Q5 Min x  3 PRN Doreene Burke Kilroy, PA-C      . pantoprazole (PROTONIX) EC tablet 40 mg  40 mg Oral Q0600 Erlene Quan, PA-C   40 mg at 08/18/14 0556  . ticagrelor (BRILINTA) tablet 90 mg  90 mg Oral BID Wellington Hampshire, MD   90 mg at 08/17/14 2116  . traMADol (ULTRAM) tablet 50 mg  50 mg Oral Q6H PRN Erlene Quan, PA-C   50 mg at 08/17/14 2116  . zolpidem (AMBIEN) tablet 5 mg  5 mg Oral QHS PRN,MR X 1 Luke K Llano, PA-C   5 mg at 08/17/14 2117    PE: General appearance: alert, cooperative and no distress Lungs: clear to auscultation bilaterally Heart: regular rate and rhythm, S1, S2 normal, no murmur, click, rub or gallop Extremities: No LEE Pulses: 2+ and symmetric Skin: Warm and dry Neurologic: Grossly normal  Lab Results:  No results for input(s): WBC, HGB, HCT, PLT in the last 72 hours. BMET  Recent Labs  08/16/14 0306  NA 138  K 4.5  CL 99  CO2 26  GLUCOSE 149*  BUN 19  CREATININE 0.99  CALCIUM 9.0    Assessment/Plan   1. Late presenting inferior infarction, s/p PCI to the  mid right coronary-  ASA, brilinta, lopressor 12.5bid.  2. Dypsnea and cough- obtain CXR: No active cardiopulm disease.  Normal LVF by cath.  No signs of CHF.  No  Orthopnea/LEE.  Can do OP echo.   3. Hypotension: BP stable. 4. Hyperlipidemia: Lipitor.  Was not on statin prior to admit.  Will need LFTs in 6 weeks 5. History of hypertension 6. Acute kidney injury, resolved 7. Second degree AV block- resolving in setting of inferior MI, caution with beta blockers.  A couple dropped QRS's on tele.  DC home today.    LOS: 4 days    Karl Fuller PA-C 08/18/2014 7:14 AM  Thompson Grayer MD

## 2014-08-18 NOTE — Discharge Summary (Signed)
Physician Discharge Summary     Cardiologist:  Fletcher Anon- new  Patient ID: Karl Alvarez MRN: [id number] DOB/AGE: 04-12-1956 58 y.o.  Admit date: 08/14/2014 Discharge date: 08/21/2014  Admission Diagnoses:  STEMI  Discharge Diagnoses:  Principal Problem:   STEMI (ST elevation myocardial infarction) Active Problems:   HTN (hypertension)   Second degree AV block-Mobitz 1   HLD (hyperlipidemia)   AKI (acute kidney injury)   Hypotension   Mitral regurgitation, mod to severe  Discharged Condition: stable  Hospital Course:   58 y/o male from [place] with no prior history of CAD. He does have a history of an "abnormal EKG" and had a stress test 5 yrs ago that was OK per the pt. He has never had a cath. 48 hrs ago he noted intermittent SSCP with radiation to his Lt shoulder. Pain was intermittent. At times he was diaphoretic. This am symptoms worsened and were associated with near syncope and weakness. He was seen in the ER with "1/10" pain. His EKG showed 2:1 AVB with HR of 40. He had inferior ST elevation with lateral TWI.  The patient was taken emergently to the cath lab.  This revealed 100% occluded RCA which was successfully stented with a Promus DES.  He has normal LVF.  He was started on ASA, Brilinta, lopressor 12.5BID, statin.  Will need LFT check ion 6 weeks.  His 2nd deg AVB resolved.  1st degree AVB on ECG.  The patient did report some SOB which started after the cath.  He reported it was like he couldn't get a breath.  This could be related to Brilinta.  CXR revealed no acute cardiopulm disease.  The patient ambulated with cardiac rehab 432ft without difficulty.  2D echo revealed ejection fraction of 45-50% with basal to mid inferior septal hypokinesis. Grade 2 diastolic dysfunction. And moderate to severe mitral regurgitation. Left atrium is severely dilated. He will need follow-up echocardiogram.  The patient was seen by Dr. Rayann Heman who felt he was stable for DC home.      Consults: Cardiac Rehab  Significant Diagnostic Studies:    Study Conclusions  - Left ventricle: The cavity size was normal. There was mild concentric hypertrophy. Systolic function was mildly reduced. The estimated ejection fraction was in the range of 45% to 50%. Basal to mid inferoseptal hypokinesis. Doppler parameters are consistent with pseudonormal left ventricular relaxation (grade 2 diastolic dysfunction). The E/A ratio is >2. The E/e&' ratio is between 8-15, suggesting indeterminate LV Filling pressure. - Aortic valve: Trileaflet. There was no stenosis. There was no regurgitation. - Mitral valve: Posterior MAC. There is moderate to severe MR. There may be mild prolapse of the posterior leaflet. - Left atrium: Severely dilated at 48 ml/m2. - Atrial septum: No defect or patent foramen ovale was identified. - Tricuspid valve: There was mild regurgitation. RVSP could not be calculated due to an inadequate jet. - Inferior vena cava: The vessel was normal in size. The respirophasic diameter changes were in the normal range (>= 50%), consistent with normal central venous pressure.  Impressions:  - LVEF 45-50%, basal to mid inferoseptal hypokinesis, GLPSS -16% (inferoseptal strain abnormality), Grade 2 diastolic dysfunction, moderate to severe MR, posterior MAC, severe LAE, mild TR.  Lipid Panel     Component Value Date/Time   CHOL 165 08/15/2014 0019   TRIG 198* 08/15/2014 0019   HDL 30* 08/15/2014 0019   CHOLHDL 5.5 08/15/2014 0019   VLDL 40 08/15/2014 0019   LDLCALC 95 08/15/2014 0019  Cardiac Catheterization Procedure Note  Name: KAVAUGHN FAUCETT MRN: [id number] DOB: 1955-11-08  Procedure: Left Heart Cath, Selective Coronary Angiography, LV angiography, PTCA and stenting of the midright coronary artery  Indication: Inferior ST elevation myocardial infarction  Medications: Sedation: 0 mg IV Versed, 0 mcg IV  Fentanyl Contrast: 120 mL Omnipaque  Procedural Details: The right wrist was prepped, draped, and anesthetized with 1% lidocaine. Using the modified Seldinger technique, a 5 French Slender sheath was introduced into the right radial artery. 3 mg of verapamil was administered through the sheath, weight-based unfractionated heparin was administered intravenously. A Jackie catheter was used for selective coronary angiography. A pigtail catheter was used for left ventriculography. Catheter exchanges were performed over an exchange length guidewire. There were no immediate procedural complications.  Procedural Findings:  Hemodynamics: AO: 84/50 mmHg LV: 84/10 mmHg LVEDP: 20 mmHg  Coronary angiography: Coronary dominance: Right   Left Main: Normal  Left Anterior Descending (LAD): Normal in size with 20% mid stenosis.  1st diagonal (D1): Large in size with minor irregularities.  2nd diagonal (D2): Very small in size.  3rd diagonal (D3): Very small in size.  Circumflex (LCx): Normal in size and nondominant. The vessel has minor irregularities.  1st obtuse marginal: Normal in size with no significant disease.  2nd obtuse marginal: Normal in size with no significant disease.  3rd obtuse marginal: Small in size with no significant disease.   Right Coronary Artery: Large in size and dominant. The vessel is occluded in the midsegment with TIMI 0 flow. There is 30% disease in the distal segment. .   Posterior descending artery: Medium in size with minor irregularities. AV segment: Normal. Normal posterolateral branches.   Left ventriculography: Left ventricular systolic function is normal , LVEF is estimated at 55 %, there is no significant mitral regurgitation . Mild basal inferior wall hypokinesis   PCI Note: Following the diagnostic procedure, the decision was made to proceed with PCI. Weight-based bivalirudin was given for anticoagulation. Once  a therapeutic ACT was achieved, a 6 [place] and JR4de catheter was inserted. A run through coronary guidewire was used to cross the lesion. The lesion was predilated with a 2.5 x 12 balloon. The lesion was then stented with a 3.0 x 28 mm Promus drug-eluting stent 3.25 x 20 mm. The stent was postdilated with a 3.25 x 20 mm noncompliant balloon. Following PCI, there was 0% residual stenosis and TIMI-3 flow. Final angiography confirmed an excellent result. The patient tolerated the procedure well. There were no immediate procedural complications. A TR band was used for radial hemostasis. The patient was transferred to the post catheterization recovery area for further monitoring.  PCI Data: Vessel - RCA: Segment - mid Percent Stenosis (pre) 100% TIMI-flow 0 Stent : 3.0 X 28 mm Promus DES Percent Stenosis (post) : 0% TIMI-flow (post) : 3   Final Conclusions:  1. 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 No obstructive disease otherwise. 2. Normal LV systolic function with mild to moderate elevation of left ventricular end-diastolic pressure.  3. Successful angioplasty and drug-eluting stent placement to the mid right coronary artery.   Recommendations:  the patient was in second-degree 2-1 AV block on presentation with a heart rate in the 40s. This resolved after PCI but he was noted to have Mobitz 1 second-degree AV block with heart rate in the 50s and 60s. No indication for pacemaker at the present time. Avoid beta blockers for now. Blood pressure is  still low. Continue IV fluids. Continue dual antiplatelet therapy for at least one year. Aggressive treatment of risk factors recommended.   Kathlyn Sacramento MD, [facility] 08/14/2014  Treatments: See above  Discharge Exam: Blood pressure 100/60, pulse 78, temperature 98.3 F (36.8 C), temperature source Oral, resp. rate 16, height 5\' 8"  (1.727 m), weight 192 lb 6.4 oz (87.272  kg), SpO2 96 %.   Disposition: 01-Home or Self Care      Discharge Instructions    Amb Referral to Cardiac Rehabilitation    Complete by:  As directed   To [place]     Diet - low sodium heart healthy    Complete by:  As directed      Discharge instructions    Complete by:  As directed   No working for about 2-3 weeks     Increase activity slowly    Complete by:  As directed             Medication List    STOP taking these medications        atenolol 50 MG tablet  Commonly known as:  TENORMIN     indomethacin 50 MG capsule  Commonly known as:  INDOCIN      TAKE these medications        aspirin 81 MG EC tablet  Take 1 tablet (81 mg total) by mouth daily.     atorvastatin 80 MG tablet  Commonly known as:  LIPITOR  Take 1 tablet (80 mg total) by mouth daily at 6 PM.     HYDROcodone-acetaminophen 5-325 MG per tablet  Commonly known as:  NORCO/VICODIN  Take 1 tablet by mouth every 6 (six) hours as needed for moderate pain.     metoprolol tartrate 25 MG tablet  Commonly known as:  LOPRESSOR  Take 0.5 tablets (12.5 mg total) by mouth 2 (two) times daily.     nitroGLYCERIN 0.4 MG SL tablet  Commonly known as:  NITROSTAT  Place 1 tablet (0.4 mg total) under the tongue every 5 (five) minutes x 3 doses as needed for chest pain.     ticagrelor 90 MG Tabs tablet  Commonly known as:  BRILINTA  Take 1 tablet (90 mg total) by mouth 2 (two) times daily.       Follow-up Information    Follow up with Kathlyn Sacramento, MD.   Specialty:  Cardiology   Why:  The offic will call with the follow up appt date and time.    Contact information:   [address] [place] [address] [phone]      Greater than 30 minutes was spent completing the patient's discharge.   SignedTarri Fuller, PA-C 08/21/2014, 6:00 PM   Thompson Grayer MD

## 2014-08-19 ENCOUNTER — Telehealth: Payer: Self-pay | Admitting: Cardiovascular Disease

## 2014-08-19 NOTE — Telephone Encounter (Signed)
New message     Pt was discharged from the hosp yesterday.  Dr Fletcher Anon put in a stent.  Wife says he is having side effects from his medications.  Please call

## 2014-08-19 NOTE — Telephone Encounter (Signed)
Pt had a cardiac cath  PTCA and stenting. Pt was D/C yesterday 08/18/14. Pt's wife said that pt's BP is running low and he is feeling bad it is running in the 100/58. Now BP is 108/50. Pt takes Metoprolol 12.5 mg twice a day. Also wife states pt is having side effect, SOB  From the Brillinta since he was in the hospital. An Xray was done but not found to have any thing wrong with his lungs. Pt also is calling to have something for sleep, Pt had a running nose prior D/C and nothing was done in the hospital. Pt's wife is aware that he may have a cold and that will run it course.  Dayna Dunn PA recommends for pt to stop the Metoprolol for now. About  the [place] for the SOB the PA said that Dr. Fletcher Anon will have to address that, because for some reason MD did not change the Brillinta to Plavix. Pt is to call her PCP for the abdominal blowding and running nose. The scheduler called pt for appointment. Pt's wife is aware she verbalized understanding.

## 2014-08-21 NOTE — Telephone Encounter (Signed)
Continue Brilinta for now. If he drinks some coffee when he takes this medication, the SOB usually gets better.

## 2014-08-23 ENCOUNTER — Ambulatory Visit (INDEPENDENT_AMBULATORY_CARE_PROVIDER_SITE_OTHER): Payer: Medicare Other | Admitting: Nurse Practitioner

## 2014-08-23 ENCOUNTER — Encounter: Payer: Self-pay | Admitting: Nurse Practitioner

## 2014-08-23 VITALS — BP 112/80 | HR 100 | Resp 14 | Ht 68.0 in | Wt 196.1 lb

## 2014-08-23 DIAGNOSIS — E785 Hyperlipidemia, unspecified: Secondary | ICD-10-CM

## 2014-08-23 DIAGNOSIS — I2111 ST elevation (STEMI) myocardial infarction involving right coronary artery: Secondary | ICD-10-CM

## 2014-08-23 DIAGNOSIS — R0600 Dyspnea, unspecified: Secondary | ICD-10-CM

## 2014-08-23 DIAGNOSIS — I340 Nonrheumatic mitral (valve) insufficiency: Secondary | ICD-10-CM

## 2014-08-23 DIAGNOSIS — I213 ST elevation (STEMI) myocardial infarction of unspecified site: Secondary | ICD-10-CM

## 2014-08-23 LAB — BASIC METABOLIC PANEL
BUN: 20 mg/dL (ref 6–23)
CO2: 25 mEq/L (ref 19–32)
Calcium: 9.1 mg/dL (ref 8.4–10.5)
Chloride: 98 mEq/L (ref 96–112)
Creatinine, Ser: 1.4 mg/dL (ref 0.4–1.5)
GFR: 54.65 mL/min — ABNORMAL LOW (ref >60.00)
Glucose, Bld: 249 mg/dL — ABNORMAL HIGH (ref 70–99)
Potassium: 4.3 mEq/L (ref 3.5–5.1)
Sodium: 132 mEq/L — ABNORMAL LOW (ref 135–145)

## 2014-08-23 LAB — CBC
HCT: 40.9 % (ref 39.0–52.0)
Hemoglobin: 13.4 g/dL (ref 13.0–17.0)
MCHC: 32.7 g/dL (ref 30.0–36.0)
MCV: 89.3 fl (ref 78.0–100.0)
Platelets: 395 10*3/uL (ref 150.0–400.0)
RBC: 4.58 Mil/uL (ref 4.22–5.81)
RDW: 12.9 % (ref 11.5–15.5)
WBC: 10.3 10*3/uL (ref 4.0–10.5)

## 2014-08-23 MED ORDER — CARVEDILOL 3.125 MG PO TABS: 3.1250 mg | ORAL_TABLET | Freq: Two times a day (BID) | ORAL | Status: DC

## 2014-08-23 MED ORDER — FUROSEMIDE 40 MG PO TABS: 40.0000 mg | ORAL_TABLET | Freq: Every day | ORAL | Status: DC

## 2014-08-23 MED ORDER — PRASUGREL HCL 10 MG PO TABS: 10.0000 mg | ORAL_TABLET | Freq: Every day | ORAL | Status: DC

## 2014-08-23 NOTE — Patient Instructions (Addendum)
We will be checking the following labs today BMET, CBC, HPF and BNP  Stay on your current medicines but I am adding Lasix 40 mg daily - take one tablet daily for 3 days and then cut back to 1/2 tablet daily  Stop Brilinta  Start Effient  Start Coreg 3.125 mg twice a day  Continue to check HR and BP and record  I will see you on December 30 at 8:30am for follow up here  Restrict your salt  See your PCP about your A1C of 7.7  Call the [place] office at [phone] if you have any questions, problems or concerns.

## 2014-08-23 NOTE — Progress Notes (Addendum)
Karl Alvarez Date of Birth: Aug 07, 1956 Medical Record #[id number]  History of Present Illness: Karl Alvarez is seen back today for a post hospital visit. Seen for Dr. Fletcher Anon. No prior history of CAD. He has had chronic shoulder issues and uses narcotics.   Presented earlier this month with chest pain - inferior ST elevation with lateral T wave inversion. Emergent cath with DEs to an 100% occluded RCA. Normal EF. Did have 2nd degree AVB which resolved along with 1st degree AV block. Echo with EF of 45 to 78%, grade 2 diastolic dysfunction and moderate to severe MR. Severe LAE noted. Troponin was >20. BNP was 4568.   Discharged on 12/13 - called the office shortly after getting home on 12/14 with low BP and dyspnea. Metoprolol was stopped. The Brilinta was continued after review by Dr. Fletcher Anon. A1C noted to be elevated.   Comes in today. Here with his wife. Has been home 5 days. He says he is getting a little better every day. He stopped the Lipitor and his Lopressor - stopped both together because he was "having too many side effects". He was more short of breath, coughing, having drainage, etc. Stopped both and seems to be improving a little each day. No chest pain/shoulder pain. Still coughing. Worse with lying down. No swelling. Weight is up. Not dizzy. Was not told about his A1C. Not aware of his echo results. Heart rate has averaged in the 60 to 65 range. BP 110 to 120.   Current Outpatient Prescriptions  Medication Sig Dispense Refill  . aspirin EC 81 MG EC tablet Take 1 tablet (81 mg total) by mouth daily.    . ticagrelor (BRILINTA) 90 MG TABS tablet Take 1 tablet (90 mg total) by mouth 2 (two) times daily. 60 tablet 10  . HYDROcodone-acetaminophen (NORCO/VICODIN) 5-325 MG per tablet Take 1 tablet by mouth every 6 (six) hours as needed for moderate pain.    . nitroGLYCERIN (NITROSTAT) 0.4 MG SL tablet Place 1 tablet (0.4 mg total) under the tongue every 5 (five) minutes x 3 doses as  needed for chest pain. (Patient not taking: Reported on 08/23/2014) 25 tablet 12   No current facility-administered medications for this visit.    Not on File  Past Medical History  Diagnosis Date  . HTN (hypertension)   . DJD (degenerative joint disease)   . Diabetes     diet controlled    Past Surgical History  Procedure Laterality Date  . Left heart catheterization with coronary angiogram N/A 08/14/2014    Procedure: LEFT HEART CATHETERIZATION WITH CORONARY ANGIOGRAM;  Surgeon: Wellington Hampshire, MD;  Location: [facility];  Service: Cardiovascular;  Laterality: N/A;    History  Smoking status  . Former Smoker  Smokeless tobacco  . Former Systems developer  . Types: Chew  . Quit date: 08/14/1974    History  Alcohol Use: Not on file    Family History  Problem Relation Age of Onset  . Hypertension Mother   . Valvular heart disease Mother   . Hypertension Brother     Review of Systems: The review of systems is per the HPI.  All other systems were reviewed and are negative.  Physical Exam: BP 112/80 mmHg  Pulse 100  Resp 14  Ht 5\' 8"  (1.727 m)  Wt 196 lb 1.9 oz (88.959 kg)  BMI 29.83 kg/m2 Patient is alert and in no acute distress. Skin is warm and dry. Color looks sallow.  HEENT is unremarkable. Normocephalic/atraumatic.  PERRL. Sclera are nonicteric. Neck is supple. No masses. No JVD. Lungs are clear. Cardiac exam shows a regular rate and rhythm but he is tachycardic and has an S3 on exam. No real murmur that I can appreciate. Abdomen is soft. Extremities are without edema. Gait and ROM are intact. No gross neurologic deficits noted.  Wt Readings from Last 3 Encounters:  08/23/14 196 lb 1.9 oz (88.959 kg)  08/18/14 192 lb 6.4 oz (87.272 kg)    LABORATORY DATA/PROCEDURES:  EKG today with NSR, evolving inferior changes. Rate is 91  Lab Results  Component Value Date   WBC 12.7* 08/15/2014   HGB 11.5* 08/15/2014   HCT 33.8* 08/15/2014   PLT 184 08/15/2014   GLUCOSE 149*  08/16/2014   CHOL 165 08/15/2014   TRIG 198* 08/15/2014   HDL 30* 08/15/2014   LDLCALC 95 08/15/2014   ALT 64* 08/14/2014   AST 148* 08/14/2014   NA 138 08/16/2014   K 4.5 08/16/2014   CL 99 08/16/2014   CREATININE 0.99 08/16/2014   BUN 19 08/16/2014   CO2 26 08/16/2014   INR 1.11 08/14/2014   HGBA1C 7.7* 08/14/2014   Lab Results  Component Value Date   TROPONINI >20.00* 08/15/2014     BNP (last 3 results)  Recent Labs  08/16/14 0306  PROBNP 4568.0*    Echo Study Conclusions  - Left ventricle: The cavity size was normal. There was mild concentric hypertrophy. Systolic function was mildly reduced. The estimated ejection fraction was in the range of 45% to 50%. Basal to mid inferoseptal hypokinesis. Doppler parameters are consistent with pseudonormal left ventricular relaxation (grade 2 diastolic dysfunction). The E/A ratio is >2. The E/e&' ratio is between 8-15, suggesting indeterminate LV Filling pressure. - Aortic valve: Trileaflet. There was no stenosis. There was no regurgitation. - Mitral valve: Posterior MAC. There is moderate to severe MR. There may be mild prolapse of the posterior leaflet. - Left atrium: Severely dilated at 48 ml/m2. - Atrial septum: No defect or patent foramen ovale was identified. - Tricuspid valve: There was mild regurgitation. RVSP could not be calculated due to an inadequate jet. - Inferior vena cava: The vessel was normal in size. The respirophasic diameter changes were in the normal range (>= 50%), consistent with normal central venous pressure.  Impressions:  - LVEF 45-50%, basal to mid inferoseptal hypokinesis, GLPSS -16% (inferoseptal strain abnormality), Grade 2 diastolic dysfunction, moderate to severe MR, posterior MAC, severe LAE, mild TR.  Cardiac Catheterization Procedure Note  Name: Karl Alvarez MRN: [id number] DOB: 04-11-1956  Procedure: Left Heart Cath, Selective Coronary  Angiography, LV angiography, PTCA and stenting of the midright coronary artery  Indication: Inferior ST elevation myocardial infarction  Medications: Sedation: 0 mg IV Versed, 0 mcg IV Fentanyl Contrast: 120 mL Omnipaque  Procedural Details: The right wrist was prepped, draped, and anesthetized with 1% lidocaine. Using the modified Seldinger technique, a 5 French Slender sheath was introduced into the right radial artery. 3 mg of verapamil was administered through the sheath, weight-based unfractionated heparin was administered intravenously. A Jackie catheter was used for selective coronary angiography. A pigtail catheter was used for left ventriculography. Catheter exchanges were performed over an exchange length guidewire. There were no immediate procedural complications.  Procedural Findings:  Hemodynamics: AO: 84/50 mmHg LV: 84/10 mmHg LVEDP: 20 mmHg  Coronary angiography: Coronary dominance: Right   Left Main: Normal  Left Anterior Descending (LAD): Normal in size with 20% mid stenosis.  1st diagonal (D1): Large in  size with minor irregularities.  2nd diagonal (D2): Very small in size.  3rd diagonal (D3): Very small in size.  Circumflex (LCx): Normal in size and nondominant. The vessel has minor irregularities.  1st obtuse marginal: Normal in size with no significant disease.  2nd obtuse marginal: Normal in size with no significant disease.  3rd obtuse marginal: Small in size with no significant disease.   Right Coronary Artery: Large in size and dominant. The vessel is occluded in the midsegment with TIMI 0 flow. There is 30% disease in the distal segment. .   Posterior descending artery: Medium in size with minor irregularities. AV segment: Normal. Normal posterolateral branches.   Left ventriculography: Left ventricular systolic function is normal , LVEF is estimated at 55 %, there is no significant mitral  regurgitation . Mild basal inferior wall hypokinesis   PCI Note: Following the diagnostic procedure, the decision was made to proceed with PCI. Weight-based bivalirudin was given for anticoagulation. Once a therapeutic ACT was achieved, a 6 [place] and JR4de catheter was inserted. A run through coronary guidewire was used to cross the lesion. The lesion was predilated with a 2.5 x 12 balloon. The lesion was then stented with a 3.0 x 28 mm Promus drug-eluting stent 3.25 x 20 mm. The stent was postdilated with a 3.25 x 20 mm noncompliant balloon. Following PCI, there was 0% residual stenosis and TIMI-3 flow. Final angiography confirmed an excellent result. The patient tolerated the procedure well. There were no immediate procedural complications. A TR band was used for radial hemostasis. The patient was transferred to the post catheterization recovery area for further monitoring.  PCI Data: Vessel - RCA: Segment - mid Percent Stenosis (pre) 100% TIMI-flow 0 Stent : 3.0 X 28 mm Promus DES Percent Stenosis (post) : 0% TIMI-flow (post) : 3   Final Conclusions:  1. 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 No obstructive disease otherwise. 2. Normal LV systolic function with mild to moderate elevation of left ventricular end-diastolic pressure.  3. Successful angioplasty and drug-eluting stent placement to the mid right coronary artery.   Recommendations:  the patient was in second-degree 2-1 AV block on presentation with a heart rate in the 40s. This resolved after PCI but he was noted to have Mobitz 1 second-degree AV block with heart rate in the 50s and 60s. No indication for pacemaker at the present time. Avoid beta blockers for now. Blood pressure is still low. Continue IV fluids. Continue dual antiplatelet therapy for at least one year. Aggressive treatment of risk factors recommended.   Kathlyn Sacramento MD,  [facility] 08/14/2014  Dg Chest 2 View  08/17/2014   CLINICAL DATA:  Productive cough for 4 days. Recent heart attack, 4 days ago. Patient underwent stent placement in his right coronary artery. History of hypertension and diabetes.  EXAM: CHEST  2 VIEW  COMPARISON:  06/03/2010.  FINDINGS: Cardiac silhouette is normal in size and configuration. Normal mediastinal and hilar contours. Clear lungs. No pleural effusion or pneumothorax.  Bony thorax is intact.  IMPRESSION: No active cardiopulmonary disease.   Electronically Signed   By: Lajean Manes M.D.   On: 08/17/2014 14:00     Assessment / Plan: 1. Inferior STEMI with normal EF by cath - s/p DES to the RCA - no active chest pain. Will try to add Coreg 3.125 mg BID.   2. Moderate to severe MR -  Discussed with Dr. Marlou Porch (DOD) about timing of  repeat echo -  Will recheck in 3 months. Hopefully the severity of his MR was more related to the MI. But we will recheck in 3 months and then refer to TCTS if needed.   3. Transient AV block.- heart rate is up by EKG today. He is off of his metoprolol and does not wish to restart. Will try Coreg.   4. Mild LV dysfunction by echo noted - +S3 on exam. Would give low dose Lasix. Lasix 40 mg for 3 days, then decrease to 20 mg a day.  5. Dyspnea - felt to be from combination of his MR and some LV dysfunction. Will diurese  6. Cough -  Felt to be due to volume overload.   7. HLD - off of his statin - had increased LFTs - will recheck   8. DM - A1C at 7.7 noted. Patient has been made aware. Will need to see his PCP.  They do not wish to go to travel to [facility] to see Dr. Fletcher Anon. Will establish here at [facility] [address]. I will see him back on 12/30 for follow up and will then arrange follow up with Dr. Marlou Porch.  Patient is agreeable to this plan and will call if any problems develop in the interim.   Burtis Junes, RN, ANP-C [place] [address] [place], [place]   [id number] [phone]  Addendum: BNP finally received 08/28/2014 - it is 63 and normal.

## 2014-08-26 NOTE — Telephone Encounter (Signed)
I spoke with the pt and he was seen in the office 12/18 for follow-up.  The pt's Brilinta was changed to Effient.  The pt is still complaining of dizziness. The pt notices this when he stands for long periods of time or takes a hot shower.  I advised the pt to not take a "hot" shower and if he begins to feel faint then he should sit down immediately. Pt agreed with plan.

## 2014-08-27 ENCOUNTER — Other Ambulatory Visit: Payer: Self-pay | Admitting: Nurse Practitioner

## 2014-08-27 DIAGNOSIS — J209 Acute bronchitis, unspecified: Secondary | ICD-10-CM | POA: Diagnosis not present

## 2014-08-27 LAB — BRAIN NATRIURETIC PEPTIDE: Brain Natriuretic Peptide: 64 pg/mL (ref 0.0–100.0)

## 2014-09-03 ENCOUNTER — Encounter: Payer: Self-pay | Admitting: Nurse Practitioner

## 2014-09-04 ENCOUNTER — Other Ambulatory Visit: Payer: Self-pay | Admitting: *Deleted

## 2014-09-04 ENCOUNTER — Encounter: Payer: Self-pay | Admitting: Nurse Practitioner

## 2014-09-04 ENCOUNTER — Telehealth: Payer: Self-pay | Admitting: Nurse Practitioner

## 2014-09-04 ENCOUNTER — Ambulatory Visit (INDEPENDENT_AMBULATORY_CARE_PROVIDER_SITE_OTHER): Payer: Medicare Other | Admitting: Nurse Practitioner

## 2014-09-04 VITALS — BP 112/68 | HR 96 | Ht 68.0 in | Wt 190.8 lb

## 2014-09-04 DIAGNOSIS — I519 Heart disease, unspecified: Secondary | ICD-10-CM

## 2014-09-04 DIAGNOSIS — E785 Hyperlipidemia, unspecified: Secondary | ICD-10-CM

## 2014-09-04 DIAGNOSIS — I340 Nonrheumatic mitral (valve) insufficiency: Secondary | ICD-10-CM

## 2014-09-04 DIAGNOSIS — I2111 ST elevation (STEMI) myocardial infarction involving right coronary artery: Secondary | ICD-10-CM

## 2014-09-04 DIAGNOSIS — I213 ST elevation (STEMI) myocardial infarction of unspecified site: Secondary | ICD-10-CM | POA: Diagnosis not present

## 2014-09-04 LAB — HEPATIC FUNCTION PANEL
ALT: 62 U/L — ABNORMAL HIGH (ref 0–53)
AST: 28 U/L (ref 0–37)
Albumin: 3.8 g/dL (ref 3.5–5.2)
Alkaline Phosphatase: 53 U/L (ref 39–117)
Bilirubin, Direct: 0 mg/dL (ref 0.0–0.3)
Total Bilirubin: 0.4 mg/dL (ref 0.2–1.2)
Total Protein: 7.2 g/dL (ref 6.0–8.3)

## 2014-09-04 LAB — BASIC METABOLIC PANEL
BUN: 22 mg/dL (ref 6–23)
CO2: 25 mEq/L (ref 19–32)
Calcium: 9.5 mg/dL (ref 8.4–10.5)
Chloride: 102 mEq/L (ref 96–112)
Creatinine, Ser: 1.2 mg/dL (ref 0.4–1.5)
GFR: 64.58 mL/min (ref >60.00)
Glucose, Bld: 259 mg/dL — ABNORMAL HIGH (ref 70–99)
Potassium: 4.1 mEq/L (ref 3.5–5.1)
Sodium: 135 mEq/L (ref 135–145)

## 2014-09-04 MED ORDER — CARVEDILOL 3.125 MG PO TABS: ORAL_TABLET | ORAL | Status: DC

## 2014-09-04 MED ORDER — CARVEDILOL 3.125 MG PO TABS: 3.1250 mg | ORAL_TABLET | Freq: Two times a day (BID) | ORAL | Status: DC

## 2014-09-04 NOTE — Progress Notes (Addendum)
Karl Alvarez Date of Birth: Jul 24, 1956 Medical Record #[id number]  History of Present Illness: Karl Alvarez is seen back today for a follow up visit. Seen for Dr. Fletcher Anon but will be establishing with Dr. Marlou Porch. No prior history of CAD. He has had chronic shoulder issues and uses narcotics.   Presented earlier this month with chest pain - inferior ST elevation with lateral T wave inversion. Emergent cath with DEs to an 100% occluded RCA. Normal EF. Did have 2nd degree AVB which resolved along with 1st degree AV block. Echo with EF of 45 to 48%, grade 2 diastolic dysfunction and moderate to severe MR. Severe LAE noted. Troponin was >20. BNP was 4568.   Discharged on 12/13 - called the office shortly after getting home on 12/14 with low BP and dyspnea. Metoprolol was stopped. The Brilinta was continued after review by Dr. Fletcher Anon. A1C noted to be elevated.   I saw him last week - he had been home 5 days. He had stopped the Lipitor and his Lopressor - stopped both together because he was "having too many side effects" - He was Alvarez short of breath, coughing, having drainage, etc. Stopped both and seems to be improving a little each day.  Was not told about his A1C. Not aware of his echo results. We made lots of med changes. Added Coreg. Changed Brilinta to Effient. Added Lasix. Will need repeat echo in 3 months. BNP checked and was normal.  Comes in today. Here with his wife. He is doing better. Breathing has improved. Still with some cough - saw his PCP - got a CXR - treated for bronchitis and on Biaxin. Has had some stomach issues with the Biaxin. Still a little productive cough that is white - not frothy or foamy. Less abdominal bloating. Weight is down 6 pounds. Pretty sedentary but was before his MI as well. Not really wanting to go to rehab at [place]. Overall seems improved. No chest pain. Still with some shoulder pain - clearly painful with movement. He was a little dizzy with starting the  Coreg but this is improved.   Current Outpatient Prescriptions  Medication Sig Dispense Refill  . aspirin EC 81 MG EC tablet Take 1 tablet (81 mg total) by mouth daily.    . carvedilol (COREG) 3.125 MG tablet Take 1 tablet (3.125 mg total) by mouth 2 (two) times daily. 60 tablet 3  . clarithromycin (BIAXIN) 500 MG tablet Take 500 mg by mouth 2 (two) times daily. bronchitis    . furosemide (LASIX) 40 MG tablet Take 1 tablet (40 mg total) by mouth daily. One tablet daily for 3 days, then 1/2 tablet daily 30 tablet 3  . HYDROcodone-acetaminophen (NORCO/VICODIN) 5-325 MG per tablet Take 1 tablet by mouth every 6 (six) hours as needed for moderate pain.    Marland Kitchen HYDROcodone-homatropine (HYCODAN) 5-1.5 MG/5ML syrup Take 5 mLs by mouth every 6 (six) hours as needed for cough.    . nitroGLYCERIN (NITROSTAT) 0.4 MG SL tablet Place 1 tablet (0.4 mg total) under the tongue every 5 (five) minutes x 3 doses as needed for chest pain. 25 tablet 12  . prasugrel (EFFIENT) 10 MG TABS tablet Take 1 tablet (10 mg total) by mouth daily. 90 tablet 3   No current facility-administered medications for this visit.    Allergies  Allergen Reactions  . Altace [Ramipril] Cough    Past Medical History  Diagnosis Date  . HTN (hypertension)   . DJD (degenerative joint disease)   .  Diabetes   . CAD (coronary artery disease) 08/2014    Inferior STEMI with PCI/DES to RCA - well preserved LV function  . Mitral regurgitation   . HLD (hyperlipidemia)     Past Surgical History  Procedure Laterality Date  . Left heart catheterization with coronary angiogram N/A 08/14/2014    Procedure: LEFT HEART CATHETERIZATION WITH CORONARY ANGIOGRAM;  Surgeon: Wellington Hampshire, MD;  Location: [facility];  Service: Cardiovascular;  Laterality: N/A;    History  Smoking status  . Former Smoker  Smokeless tobacco  . Former Systems developer  . Types: Chew  . Quit date: 08/14/1974    History  Alcohol Use: Not on file    Family History    Problem Relation Age of Onset  . Hypertension Mother   . Valvular heart disease Mother   . Hypertension Brother     Review of Systems: The review of systems is per the HPI.  All other systems were reviewed and are negative.  Physical Exam: BP 112/68 mmHg  Pulse 96  Ht 5\' 8"  (1.727 m)  Wt 190 lb 12.8 oz (86.546 kg)  BMI 29.02 kg/m2 Patient is very pleasant and in no acute distress. Skin is warm and dry. Color is normal.  HEENT is unremarkable. Normocephalic/atraumatic. PERRL. Sclera are nonicteric. Neck is supple. No masses. No JVD. Lungs are clear. Cardiac exam shows a regular rate and rhythm. Herat rate a little fast but no S3 today and no real murmur that I can appreciate. Abdomen is soft. Extremities are without edema. Gait and ROM are intact. No gross neurologic deficits noted.  Wt Readings from Last 3 Encounters:  09/04/14 190 lb 12.8 oz (86.546 kg)  08/23/14 196 lb 1.9 oz (88.959 kg)  08/18/14 192 lb 6.4 oz (87.272 kg)    LABORATORY DATA/PROCEDURES:  Lab Results  Component Value Date   WBC 10.3 08/23/2014   HGB 13.4 08/23/2014   HCT 40.9 08/23/2014   PLT 395.0 08/23/2014   GLUCOSE 249* 08/23/2014   CHOL 165 08/15/2014   TRIG 198* 08/15/2014   HDL 30* 08/15/2014   LDLCALC 95 08/15/2014   ALT 64* 08/14/2014   AST 148* 08/14/2014   NA 132* 08/23/2014   K 4.3 08/23/2014   CL 98 08/23/2014   CREATININE 1.4 08/23/2014   BUN 20 08/23/2014   CO2 25 08/23/2014   INR 1.11 08/14/2014   HGBA1C 7.7* 08/14/2014    BNP (last 3 results)  Recent Labs  08/16/14 0306  PROBNP 4568.0*   Echo Study Conclusions  - Left ventricle: The cavity size was normal. There was mild concentric hypertrophy. Systolic function was mildly reduced. The estimated ejection fraction was in the range of 45% to 50%. Basal to mid inferoseptal hypokinesis. Doppler parameters are consistent with pseudonormal left ventricular relaxation (grade 2 diastolic dysfunction). The E/A ratio  is >2. The E/e&' ratio is between 8-15, suggesting indeterminate LV Filling pressure. - Aortic valve: Trileaflet. There was no stenosis. There was no regurgitation. - Mitral valve: Posterior MAC. There is moderate to severe MR. There may be mild prolapse of the posterior leaflet. - Left atrium: Severely dilated at 48 ml/m2. - Atrial septum: No defect or patent foramen ovale was identified. - Tricuspid valve: There was mild regurgitation. RVSP could not be calculated due to an inadequate jet. - Inferior vena cava: The vessel was normal in size. The respirophasic diameter changes were in the normal range (>= 50%), consistent with normal central venous pressure.  Impressions:  - LVEF  45-50%, basal to mid inferoseptal hypokinesis, GLPSS -16% (inferoseptal strain abnormality), Grade 2 diastolic dysfunction, moderate to severe MR, posterior MAC, severe LAE, mild TR.  Cardiac Catheterization Procedure Note  Name: Karl Alvarez MRN: [id number] DOB: 07-07-1956  Procedure: Left Heart Cath, Selective Coronary Angiography, LV angiography, PTCA and stenting of the midright coronary artery  Indication: Inferior ST elevation myocardial infarction  Medications: Sedation: 0 mg IV Versed, 0 mcg IV Fentanyl Contrast: 120 mL Omnipaque  Procedural Details: The right wrist was prepped, draped, and anesthetized with 1% lidocaine. Using the modified Seldinger technique, a 5 French Slender sheath was introduced into the right radial artery. 3 mg of verapamil was administered through the sheath, weight-based unfractionated heparin was administered intravenously. A Jackie catheter was used for selective coronary angiography. A pigtail catheter was used for left ventriculography. Catheter exchanges were performed over an exchange length guidewire. There were no immediate procedural complications.  Procedural Findings:  Hemodynamics: AO: 84/50 mmHg LV:  84/10 mmHg LVEDP: 20 mmHg  Coronary angiography: Coronary dominance: Right   Left Main: Normal  Left Anterior Descending (LAD): Normal in size with 20% mid stenosis.  1st diagonal (D1): Large in size with minor irregularities.  2nd diagonal (D2): Very small in size.  3rd diagonal (D3): Very small in size.  Circumflex (LCx): Normal in size and nondominant. The vessel has minor irregularities.  1st obtuse marginal: Normal in size with no significant disease.  2nd obtuse marginal: Normal in size with no significant disease.  3rd obtuse marginal: Small in size with no significant disease.   Right Coronary Artery: Large in size and dominant. The vessel is occluded in the midsegment with TIMI 0 flow. There is 30% disease in the distal segment. .   Posterior descending artery: Medium in size with minor irregularities. AV segment: Normal. Normal posterolateral branches.   Left ventriculography: Left ventricular systolic function is normal , LVEF is estimated at 55 %, there is no significant mitral regurgitation . Mild basal inferior wall hypokinesis   PCI Note: Following the diagnostic procedure, the decision was made to proceed with PCI. Weight-based bivalirudin was given for anticoagulation. Once a therapeutic ACT was achieved, a 6 [place] and JR4de catheter was inserted. A run through coronary guidewire was used to cross the lesion. The lesion was predilated with a 2.5 x 12 balloon. The lesion was then stented with a 3.0 x 28 mm Promus drug-eluting stent 3.25 x 20 mm. The stent was postdilated with a 3.25 x 20 mm noncompliant balloon. Following PCI, there was 0% residual stenosis and TIMI-3 flow. Final angiography confirmed an excellent result. The patient tolerated the procedure well. There were no immediate procedural complications. A TR band was used for radial hemostasis. The patient was transferred to the post catheterization recovery area for further  monitoring.  PCI Data: Vessel - RCA: Segment - mid Percent Stenosis (pre) 100% TIMI-flow 0 Stent : 3.0 X 28 mm Promus DES Percent Stenosis (post) : 0% TIMI-flow (post) : 3   Final Conclusions:  1. 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 No obstructive disease otherwise. 2. Normal LV systolic function with mild to moderate elevation of left ventricular end-diastolic pressure.  3. Successful angioplasty and drug-eluting stent placement to the mid right coronary artery.   Recommendations:  the patient was in second-degree 2-1 AV block on presentation with a heart rate in the 40s. This resolved after PCI but he was noted to have Mobitz 1 second-degree  AV block with heart rate in the 50s and 60s. No indication for pacemaker at the present time. Avoid beta blockers for now. Blood pressure is still low. Continue IV fluids. Continue dual antiplatelet therapy for at least one year. Aggressive treatment of risk factors recommended.   Kathlyn Sacramento MD, [facility] 08/14/2014  Dg Chest 2 View  08/17/2014 CLINICAL DATA: Productive cough for 4 days. Recent heart attack, 4 days ago. Patient underwent stent placement in his right coronary artery. History of hypertension and diabetes. EXAM: CHEST 2 VIEW COMPARISON: 06/03/2010. FINDINGS: Cardiac silhouette is normal in size and configuration. Normal mediastinal and hilar contours. Clear lungs. No pleural effusion or pneumothorax. Bony thorax is intact. IMPRESSION: No active cardiopulmonary disease. Electronically Signed By: Lajean Manes M.D. On: 08/17/2014 14:00     Assessment / Plan: 1. Inferior STEMI with normal EF by cath - s/p DES to the RCA - doing well clinically. No chest pain. Continue Effient. Needs to start walking - discussed at length today. Discuss cardiac rehab again on return OV.  2. Moderate to severe MR - Discussed previously with Dr. Marlou Porch (DOD)  about timing of repeat echo - to recheck in 3 months (March). Hopefully the severity of his MR was Alvarez related to the MI. But we will recheck in 3 months and then refer to TCTS if needed.   3. Transient AV block. Heart rate remains in the 90 to low 100's per home checks and here - will increase Coreg to 1 in the AM and 2 in the PM.   4. Mild LV dysfunction by echo noted - will need repeat. On low dose lasix with improvement in his symptoms of volume overload  5. Dyspnea - improving  6. Cough -improving - currently on treatment for bronchitis as well.   7. HLD - off of his statin - had increased LFTs - needs rechecked - this was missed on last visit by the lab  8. DM - A1C at 7.7 noted. Patient has already been made aware. Will need to see his PCP.  Recheck his labs today. See Dr. Marlou Porch with fasting labs in one month.  Patient is agreeable to this plan and will call if any problems develop in the interim.   Burtis Junes, RN, [facility] [address] [place], [place] [address] [phone]

## 2014-09-04 NOTE — Patient Instructions (Signed)
We will be checking the following labs today BMET & HPF   Stay on your current medicines but I am increasing the Coreg to 1 in the Am and 2 in the PM  Walking every day - start at 10 minutes and add a minute a day  See Dr. Marlou Porch in one month with fasting labs  Call the [facility] office at [phone] if you have any questions, problems or concerns.

## 2014-09-04 NOTE — Telephone Encounter (Signed)
New Msg      Haynes Dage from [facility] calling, prescription sent today with two sets of directions. Needs to know what correct dosage should be for Coreg.   Please call to clarify ask for Haynes Dage at [phone].

## 2014-09-04 NOTE — Telephone Encounter (Signed)
Left message for pt to call back.  S/w Haynes Dage from [facility] and corrected script and clarified with Haynes Dage the current dose.

## 2014-09-27 ENCOUNTER — Ambulatory Visit: Payer: Medicare Other | Admitting: Cardiology

## 2014-10-17 ENCOUNTER — Ambulatory Visit (INDEPENDENT_AMBULATORY_CARE_PROVIDER_SITE_OTHER): Payer: Medicare Other | Admitting: Cardiology

## 2014-10-17 ENCOUNTER — Encounter: Payer: Self-pay | Admitting: Cardiology

## 2014-10-17 VITALS — BP 115/82 | HR 89 | Ht 68.0 in | Wt 191.0 lb

## 2014-10-17 DIAGNOSIS — I340 Nonrheumatic mitral (valve) insufficiency: Secondary | ICD-10-CM

## 2014-10-17 DIAGNOSIS — I2583 Coronary atherosclerosis due to lipid rich plaque: Principal | ICD-10-CM

## 2014-10-17 DIAGNOSIS — I2510 Atherosclerotic heart disease of native coronary artery without angina pectoris: Secondary | ICD-10-CM

## 2014-10-17 DIAGNOSIS — R740 Nonspecific elevation of levels of transaminase and lactic acid dehydrogenase [LDH]: Secondary | ICD-10-CM

## 2014-10-17 DIAGNOSIS — R7401 Elevation of levels of liver transaminase levels: Secondary | ICD-10-CM

## 2014-10-17 NOTE — Progress Notes (Signed)
[facility]. [address], [address] [place], [place]  [address] Phone: [phone] Fax:  [phone]  Date:  10/17/2014   ID:  Karl Alvarez, DOB 1956/01/11, MRN [id number]  PCP:  Gilford Rile, MD   History of Present Illness: Karl Alvarez is a 59 y.o. 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 He also has diabetes, hypertension , hyperlipidemia. ACE inhibitor cough in the past.   He has struggled with side effects of medications for quite some time. Currently he is battling metformin. I asked him to slowly increase this once again from 500 mg on up. He stopped his statin medication as well. ALT had been in the 60s. Side effects as well.   Overall he is pleased with his symptoms, no shortness of breath, no chest pain. No edema. Feeling much better. At last visit with Cecille Rubin in December, she did not hear a significant mitral regurgitation murmur. nor do I.   Wt Readings from Last 3 Encounters:  10/17/14 191 lb (86.637 kg)  09/04/14 190 lb 12.8 oz (86.546 kg)  08/23/14 196 lb 1.9 oz (88.959 kg)     Past Medical History  Diagnosis Date  . HTN (hypertension)   . DJD (degenerative joint disease)   . Diabetes   . CAD (coronary artery disease) 08/2014    Inferior STEMI with PCI/DES to RCA - well preserved LV function  . Mitral regurgitation   . HLD (hyperlipidemia)     Past Surgical History  Procedure Laterality Date  . Left heart catheterization with coronary angiogram N/A 08/14/2014    Procedure: LEFT HEART CATHETERIZATION WITH CORONARY ANGIOGRAM;  Surgeon: Wellington Hampshire, MD;  Location: [facility];  Service: Cardiovascular;  Laterality: N/A;    Current Outpatient Prescriptions  Medication Sig Dispense Refill  . aspirin EC 81 MG EC tablet Take 1 tablet (81 mg total) by mouth daily.    .  carvedilol (COREG) 3.125 MG tablet Take one tablet (3.125) in the AM and 2 tablets (6.25)  in the PM 60 tablet 3  . furosemide (LASIX) 40 MG tablet Take 1 tablet (40 mg total) by mouth daily. One tablet daily for 3 days, then 1/2 tablet daily 30 tablet 3  . HYDROcodone-homatropine (HYCODAN) 5-1.5 MG/5ML syrup Take 5 mLs by mouth every 6 (six) hours as needed for cough.    . metFORMIN (GLUCOPHAGE) 500 MG tablet Take 1,000 mg by mouth 2 (two) times daily with a meal. Patient taking by mouth two 500 mg tabs in the am and two 500 mg tabs in the pm.    . nitroGLYCERIN (NITROSTAT) 0.4 MG SL tablet Place 1 tablet (0.4 mg total) under the tongue every 5 (five) minutes x 3 doses as needed for chest pain. 25 tablet 12  . prasugrel (EFFIENT) 10 MG TABS tablet Take 1 tablet (10 mg total) by mouth daily. 90 tablet 3   No current facility-administered medications for this visit.    Allergies:    Allergies  Allergen Reactions  . Altace [Ramipril] Cough    Social History:  The patient  reports that he has quit smoking. He quit smokeless tobacco use about 40 years ago. His smokeless tobacco use included Chew.   Family History  Problem Relation Age of Onset  . Hypertension Mother   . Valvular heart disease  Mother   . Hypertension Brother     ROS:  Please see the history of present illness.    Denies any rashes, syncope, bleeding, orthopnea  All other systems reviewed and negative.   PHYSICAL EXAM: VS:  BP 115/82 mmHg  Pulse 89  Ht 5\' 8"  (1.727 m)  Wt 191 lb (86.637 kg)  BMI 29.05 kg/m2  SpO2 97% Well nourished, well developed, in no acute distress HEENT: normal, [place]/AT, EOMI Neck: no JVD, normal carotid upstroke, no bruit Cardiac:  normal S1, S2; RRR; no murmur Lungs:  clear to auscultation bilaterally, no wheezing, rhonchi or rales Abd: soft, nontender, no hepatomegaly, no bruits Ext: no edema, 2+ distal pulses Skin: warm and dry GU: deferred Neuro: no focal abnormalities noted, AAO x  3  EKG:   None today  ASSESSMENT AND PLAN:  1.  inferior myocardial infarction December 2016 -RCA stent, drug-eluting, dual antiplatelet therapy with Effient. Continue with low-dose aspirin. Try to avoid excessive aspirin products. 2.  Mitral regurgitation-we will repeat echocardiogram in March. Previously described as moderate to severe itch is likely in the setting of inferior infarct. I do not appreciate a murmur today and hopefully with restoration of inferior wall motion abnormality, his MR has improved as well. 3.  Diabetes-hemoglobin A1c 7.7. He has battled with metformin. He states that he increased his metformin from 5 mg up to 2000 mg and he is having diarrhea and stomach upset. I instructed him to discuss this further with his primary physician and to start slow once again and gradually increase. 4.  Hyperlipidemia-LDL goal is 70. ALT previously was 62. I'm going to repeat his ALT today. In approximate 3-4 weeks I would like for him to start his atorvastatin once again however at 820 mg dosage. He can cut the pills into quarters. He was having side effects with this medication previously.   We will see him back in 2 months. We will follow-up with echocardiogram.  Signed, Candee Furbish, MD [facility]  10/17/2014 3:35 PM

## 2014-10-17 NOTE — Patient Instructions (Signed)
Please decrease Metformin to 500 mg a day and slowly increase it as tolerated. In about 3 to 4 weeks please restart Atorvastatin at 20 mg a day. Continue all other medications as listed.  Please have blood work today (ALT).  Your physician has requested that you have an echocardiogram in March. Echocardiography is a painless test that uses sound waves to create images of your heart. It provides your doctor with information about the size and shape of your heart and how well your heart's chambers and valves are working. This procedure takes approximately one hour. There are no restrictions for this procedure.  Follow up in 2 months with Dr Marlou Porch.  Thank you for choosing [facility]!!

## 2014-10-18 LAB — ALT: ALT: 31 U/L (ref 0–53)

## 2014-11-15 ENCOUNTER — Ambulatory Visit (HOSPITAL_COMMUNITY): Payer: Medicare Other | Attending: Internal Medicine | Admitting: Radiology

## 2014-11-15 DIAGNOSIS — Z87891 Personal history of nicotine dependence: Secondary | ICD-10-CM | POA: Insufficient documentation

## 2014-11-15 DIAGNOSIS — I10 Essential (primary) hypertension: Secondary | ICD-10-CM | POA: Insufficient documentation

## 2014-11-15 DIAGNOSIS — I340 Nonrheumatic mitral (valve) insufficiency: Secondary | ICD-10-CM | POA: Diagnosis not present

## 2014-11-15 DIAGNOSIS — E785 Hyperlipidemia, unspecified: Secondary | ICD-10-CM | POA: Diagnosis not present

## 2014-11-15 DIAGNOSIS — E119 Type 2 diabetes mellitus without complications: Secondary | ICD-10-CM | POA: Insufficient documentation

## 2014-11-15 NOTE — Progress Notes (Signed)
Echocardiogram performed.  

## 2014-12-17 ENCOUNTER — Encounter: Payer: Self-pay | Admitting: Cardiology

## 2014-12-17 ENCOUNTER — Ambulatory Visit (INDEPENDENT_AMBULATORY_CARE_PROVIDER_SITE_OTHER): Payer: Medicare Other | Admitting: Cardiology

## 2014-12-17 VITALS — BP 124/86 | HR 93 | Ht 68.0 in | Wt 191.0 lb

## 2014-12-17 DIAGNOSIS — I340 Nonrheumatic mitral (valve) insufficiency: Secondary | ICD-10-CM

## 2014-12-17 DIAGNOSIS — E119 Type 2 diabetes mellitus without complications: Secondary | ICD-10-CM

## 2014-12-17 DIAGNOSIS — Z79899 Other long term (current) drug therapy: Secondary | ICD-10-CM | POA: Diagnosis not present

## 2014-12-17 DIAGNOSIS — I10 Essential (primary) hypertension: Secondary | ICD-10-CM | POA: Insufficient documentation

## 2014-12-17 LAB — BASIC METABOLIC PANEL
BUN: 20 mg/dL (ref 6–23)
CALCIUM: 10.2 mg/dL (ref 8.4–10.5)
CO2: 28 mEq/L (ref 19–32)
Chloride: 99 mEq/L (ref 96–112)
Creatinine, Ser: 0.98 mg/dL (ref 0.40–1.50)
GFR: 83.07 mL/min (ref >60.00)
GLUCOSE: 195 mg/dL — AB (ref 70–99)
Potassium: 4.3 mEq/L (ref 3.5–5.1)
Sodium: 134 mEq/L — ABNORMAL LOW (ref 135–145)

## 2014-12-17 LAB — CBC
HCT: 42 % (ref 39.0–52.0)
Hemoglobin: 14.4 g/dL (ref 13.0–17.0)
MCHC: 34.2 g/dL (ref 30.0–36.0)
MCV: 87.7 fl (ref 78.0–100.0)
Platelets: 263 10*3/uL (ref 150.0–400.0)
RBC: 4.79 Mil/uL (ref 4.22–5.81)
RDW: 13.5 % (ref 11.5–15.5)
WBC: 8.5 10*3/uL (ref 4.0–10.5)

## 2014-12-17 LAB — ALT: ALT: 23 U/L (ref 0–53)

## 2014-12-17 LAB — HEMOGLOBIN A1C: HEMOGLOBIN A1C: 6.7 % — AB (ref 4.6–6.5)

## 2014-12-17 MED ORDER — CARVEDILOL 6.25 MG PO TABS: 6.2500 mg | ORAL_TABLET | Freq: Two times a day (BID) | ORAL | Status: DC

## 2014-12-17 MED ORDER — CLOPIDOGREL BISULFATE 75 MG PO TABS: 75.0000 mg | ORAL_TABLET | Freq: Every day | ORAL | Status: DC

## 2014-12-17 NOTE — Progress Notes (Signed)
[facility]. [address], [address], [place]  [address] Phone: [phone] Fax:  [phone]  Date:  12/17/2014   ID:  Karl Alvarez, DOB October 04, 1955, MRN [id number]  PCP:  Gilford Rile, MD   History of Present Illness: Karl Alvarez is a 59 y.o. 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 He also has diabetes, hypertension , hyperlipidemia. ACE inhibitor cough in the past.   He has struggled with side effects of medications for quite some time. Currently he is battling metformin. I asked him to slowly increase this once again from 500 mg on up. He stopped his statin medication as well. ALT had been in the 60s. Side effects as well.   Overall he is pleased with his symptoms, no shortness of breath, no chest pain. No edema. Feeling much better. At last visit with Cecille Rubin in December, she did not hear a significant mitral regurgitation murmur. nor do I.  12/17/14 - has irreg HB at night after medication. HTN increased one night. Felt palpitations. 155/100. Took NTG. Made him anxious. Pulse felt like 7 beats fast and then pause and then another seven beats. Cost of Effient too high.     Wt Readings from Last 3 Encounters:  12/17/14 191 lb (86.637 kg)  10/17/14 191 lb (86.637 kg)  09/04/14 190 lb 12.8 oz (86.546 kg)     Past Medical History  Diagnosis Date  . HTN (hypertension)   . DJD (degenerative joint disease)   . Diabetes   . CAD (coronary artery disease) 08/2014    Inferior STEMI with PCI/DES to RCA - well preserved LV function  . Mitral regurgitation   . HLD (hyperlipidemia)     Past Surgical History  Procedure Laterality Date  . Left heart catheterization with coronary angiogram N/A 08/14/2014    Procedure: LEFT HEART CATHETERIZATION WITH CORONARY ANGIOGRAM;  Surgeon: Wellington Hampshire,  MD;  Location: [facility];  Service: Cardiovascular;  Laterality: N/A;    Current Outpatient Prescriptions  Medication Sig Dispense Refill  . aspirin EC 81 MG EC tablet Take 1 tablet (81 mg total) by mouth daily.    . carvedilol (COREG) 3.125 MG tablet Take one tablet (3.125) in the AM and 2 tablets (6.25)  in the PM 60 tablet 3  . furosemide (LASIX) 40 MG tablet Take 1 tablet (40 mg total) by mouth daily. One tablet daily for 3 days, then 1/2 tablet daily 30 tablet 3  . metFORMIN (GLUCOPHAGE) 500 MG tablet Take 1,000 mg by mouth 2 (two) times daily with a meal. Patient taking by mouth two 500 mg tabs in the am and two 500 mg tabs in the pm.    . nitroGLYCERIN (NITROSTAT) 0.4 MG SL tablet Place 1 tablet (0.4 mg total) under the tongue every 5 (five) minutes x 3 doses as needed for chest pain. 25 tablet 12  . prasugrel (EFFIENT) 10 MG TABS tablet Take 1 tablet (10 mg total) by mouth daily. 90 tablet 3   No current facility-administered medications for this visit.    Allergies:    Allergies  Allergen Reactions  . Altace [Ramipril] Cough    Social History:  The patient  reports that he has quit smoking. He quit smokeless tobacco use about 40 years ago. His smokeless tobacco use included Chew.  Family History  Problem Relation Age of Onset  . Hypertension Mother   . Valvular heart disease Mother   . Hypertension Brother     ROS:  Please see the history of present illness.    Denies any rashes, syncope, bleeding, orthopnea  All other systems reviewed and negative.   PHYSICAL EXAM: VS:  BP 124/86 mmHg  Pulse 93  Ht 5\' 8"  (1.727 m)  Wt 191 lb (86.637 kg)  BMI 29.05 kg/m2 Well nourished, well developed, in no acute distress HEENT: normal, /AT, EOMI Neck: no JVD, normal carotid upstroke, no bruit Cardiac:  normal S1, S2; RRR; no murmur Lungs:  clear to auscultation bilaterally, no wheezing, rhonchi or rales Abd: soft, nontender, no hepatomegaly, no bruits Ext: no edema, 2+  distal pulses Skin: warm and dry GU: deferred Neuro: no focal abnormalities noted, AAO x 3  EKG:   None today  ASSESSMENT AND PLAN:  1.  inferior myocardial infarction December 2016 -RCA stent, drug-eluting, dual antiplatelet therapy with Effient. Will change to Plavix due to cost. Continue with low-dose aspirin. Try to avoid excessive aspirin products. 2.  Mitral regurgitation-repeat echocardiogram in March looked great.  3.  Diabetes-hemoglobin A1c 7.7. Prior.  He has battled with metformin. 2000 mg and he is having diarrhea and stomach upset. He can not seem to tolerate higher doses. He would like me to check a hemoglobin A1c today. I think this is reasonable. I will send results to PCP.  4.  Hyperlipidemia-LDL goal is 70. ALT previously was 62. I'm going to repeat his ALT today again. In approximate 3-4 weeks I would like for him to start his atorvastatin once again however at 20 mg dosage. He can cut the pills into quarters. He was having side effects with this medication previously. He still is having hesitation about atorvastatin.    Signed, Candee Furbish, MD [facility]  12/17/2014 2:43 PM

## 2014-12-17 NOTE — Patient Instructions (Signed)
Please stop your Effient. Start Plavix. Increase carvedilol to 6.25 mg one tablet twice a day. Continue all other medications as listed.  Please have blood work today (ALT, CBC, HA1C and BMP)  Follow up in 4 months with Dr. Marlou Porch.  You will receive a letter in the mail 2 months before you are due.  Please call us when you receive this letter to schedule your follow up appointment.  Thank you for choosing [facility]!!

## 2015-03-05 ENCOUNTER — Other Ambulatory Visit: Payer: Self-pay | Admitting: Nurse Practitioner

## 2015-03-27 ENCOUNTER — Other Ambulatory Visit: Payer: Self-pay

## 2015-03-27 MED ORDER — CARVEDILOL 6.25 MG PO TABS: 6.2500 mg | ORAL_TABLET | Freq: Two times a day (BID) | ORAL | Status: DC

## 2015-03-27 MED ORDER — FUROSEMIDE 40 MG PO TABS: 40.0000 mg | ORAL_TABLET | Freq: Every day | ORAL | Status: DC

## 2015-04-02 ENCOUNTER — Other Ambulatory Visit: Payer: Self-pay | Admitting: Nurse Practitioner

## 2015-04-03 ENCOUNTER — Telehealth: Payer: Self-pay

## 2015-04-03 ENCOUNTER — Other Ambulatory Visit: Payer: Self-pay

## 2015-04-03 MED ORDER — FUROSEMIDE 40 MG PO TABS: 40.0000 mg | ORAL_TABLET | Freq: Every day | ORAL | Status: DC

## 2015-04-03 NOTE — Telephone Encounter (Signed)
Called [facility] at [phone] for clarification of order # [id number] Lasix  Clarified Lasix dose as 1/2 tab of 40 mg every AM

## 2015-05-05 ENCOUNTER — Other Ambulatory Visit: Payer: Self-pay | Admitting: *Deleted

## 2015-05-05 MED ORDER — CLOPIDOGREL BISULFATE 75 MG PO TABS: 75.0000 mg | ORAL_TABLET | Freq: Every day | ORAL | Status: DC

## 2015-05-16 ENCOUNTER — Encounter: Payer: Self-pay | Admitting: Cardiology

## 2015-05-16 ENCOUNTER — Ambulatory Visit (INDEPENDENT_AMBULATORY_CARE_PROVIDER_SITE_OTHER): Payer: Medicare Other | Admitting: Cardiology

## 2015-05-16 VITALS — BP 140/80 | HR 91 | Ht 68.0 in | Wt 187.1 lb

## 2015-05-16 DIAGNOSIS — I10 Essential (primary) hypertension: Secondary | ICD-10-CM | POA: Diagnosis not present

## 2015-05-16 DIAGNOSIS — E119 Type 2 diabetes mellitus without complications: Secondary | ICD-10-CM | POA: Diagnosis not present

## 2015-05-16 DIAGNOSIS — I252 Old myocardial infarction: Secondary | ICD-10-CM | POA: Diagnosis not present

## 2015-05-16 NOTE — Patient Instructions (Addendum)
Medication Instructions:  Please stop your Furosemide. Continue all other medications as listed.  Follow-Up: Follow up in 3 months with Dr Marlou Porch to discuss stopping your Plavix.  Thank you for choosing [name]!!

## 2015-05-16 NOTE — Addendum Note (Signed)
Addended by: Shellia Cleverly on: 05/16/2015 09:13 AM   Modules accepted: Orders, Medications

## 2015-05-16 NOTE — Progress Notes (Addendum)
[facility]. [address], [address] [place], [place]  [address] Phone: [phone] Fax:  [phone]  Date:  05/16/2015   ID:  Karl Alvarez, DOB 1955-11-08, MRN [id number]  PCP:  Gilford Rile, MD   History of Present Illness: Karl Alvarez is a 59 y.o. 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 He also has diabetes, hypertension , hyperlipidemia. ACE inhibitor cough in the past.   He has struggled with side effects of medications for quite some time. Currently he is battling metformin. I asked him to slowly increase this once again from 500 mg on up. He stopped his statin medication as well. ALT had been in the 60s. Side effects as well.   Overall he is pleased with his symptoms, no shortness of breath, no chest pain. No edema. Feeling much better. At last visit with Cecille Rubin in December, she did not hear a significant mitral regurgitation murmur. nor do I.  12/17/14 - has irreg HB at night after medication. HTN increased one night. Felt palpitations. 155/100. Took NTG. Made him anxious. Pulse felt like 7 beats fast and then pause and then another seven beats. Cost of Effient too high.    05/16/15 - still having irregular heart beat. Fast. BP dropped. HR then dropped to 48bpm. BP 98/50 for 8 hours. One episode. Heart rate usually over 100 bpm. Lots of back pain, Vicodin 6pm. Daily. Palpitations have gotten better. BP better. A1c 5.7 (metformin causes diarrhea off and on - hydocodone).   Wt Readings from Last 3 Encounters:  05/16/15 187 lb 1.9 oz (84.877 kg)  12/17/14 191 lb (86.637 kg)  10/17/14 191 lb (86.637 kg)     Past Medical History  Diagnosis Date  . HTN (hypertension)   . DJD (degenerative joint disease)   . Diabetes   . CAD (coronary artery disease) 08/2014    Inferior STEMI with PCI/DES to RCA -  well preserved LV function  . Mitral regurgitation   . HLD (hyperlipidemia)     Past Surgical History  Procedure Laterality Date  . Left heart catheterization with coronary angiogram N/A 08/14/2014    Procedure: LEFT HEART CATHETERIZATION WITH CORONARY ANGIOGRAM;  Surgeon: Wellington Hampshire, MD;  Location: [facility];  Service: Cardiovascular;  Laterality: N/A;    Current Outpatient Prescriptions  Medication Sig Dispense Refill  . acetaminophen (TYLENOL) 650 MG CR tablet Take 1,300 mg by mouth daily.    Marland Kitchen aspirin EC 81 MG EC tablet Take 1 tablet (81 mg total) by mouth daily.    Marland Kitchen atorvastatin (LIPITOR) 10 MG tablet Take 10 mg by mouth daily at 6 PM.    . carvedilol (COREG) 6.25 MG tablet Take 1 tablet (6.25 mg total) by mouth 2 (two) times daily with a meal. 180 tablet 0  . clopidogrel (PLAVIX) 75 MG tablet Take 1 tablet (75 mg total) by mouth daily. 90 tablet 3  . furosemide (LASIX) 40 MG tablet Take 1/2 tablet daily in AM    . HYDROcodone-acetaminophen (NORCO/VICODIN) 5-325 MG per tablet Take 1 tablet by mouth every 4 (four) hours as needed for moderate pain (Starting at 6pm daily until asleep).     . metFORMIN (GLUCOPHAGE) 500 MG tablet Take 1,000 mg by mouth 2 (two) times daily with a meal. Patient taking by mouth two 500 mg  tabs in the am and two 500 mg tabs in the pm.    . nitroGLYCERIN (NITROSTAT) 0.4 MG SL tablet Place 1 tablet (0.4 mg total) under the tongue every 5 (five) minutes x 3 doses as needed for chest pain. 25 tablet 12   No current facility-administered medications for this visit.    Allergies:    Allergies  Allergen Reactions  . Altace [Ramipril] Cough    Social History:  The patient  reports that he has quit smoking. He quit smokeless tobacco use about 40 years ago. His smokeless tobacco use included Chew.   Family History  Problem Relation Age of Onset  . Hypertension Mother   . Valvular heart disease Mother   . Hypertension Brother     ROS:  Please see  the history of present illness.    Denies any rashes, syncope, bleeding, orthopnea  All other systems reviewed and negative.   PHYSICAL EXAM: VS:  BP 140/80 mmHg  Pulse 91  Ht 5\' 8"  (1.727 m)  Wt 187 lb 1.9 oz (84.877 kg)  BMI 28.46 kg/m2  SpO2 98% Well nourished, well developed, in no acute distress HEENT: normal, [place]/AT, EOMI Neck: no JVD, normal carotid upstroke, no bruit Cardiac:  normal S1, S2; RRR; no murmur Lungs:  clear to auscultation bilaterally, no wheezing, rhonchi or rales Abd: soft, nontender, no hepatomegaly, no bruits Ext: no edema, 2+ distal pulses Skin: warm and dry GU: deferred Neuro: no focal abnormalities noted, AAO x 3  EKG:   None today  ASSESSMENT AND PLAN:  1. Inferior myocardial infarction December 2016 -RCA stent, drug-eluting, dual antiplatelet therapy Plavix. Continue with low-dose aspirin. Try to avoid excessive aspirin products. In December, we will discontinue Plavix, one year. 2.  Mitral regurgitation-repeat echocardiogram in March looked great.  3.  Diabetes-hemoglobin A1c 7.7. Now in the 5 range. Excellent. Prior.  He has battled with metformin diarrhea and stomach upset.   4.  Hyperlipidemia-LDL goal is 70. ALT previously was 62, now normal. 5. Palpitations-East have improved. PACs were noted on telemetry monitor during hospital. We will see how he does with these. 6. Back in December. Will stop lasix - signs of dehydration previously. EF normal.    Signed, Candee Furbish, MD [facility]  05/16/2015 9:01 AM

## 2015-05-25 ENCOUNTER — Other Ambulatory Visit: Payer: Self-pay | Admitting: Cardiology

## 2015-06-01 ENCOUNTER — Other Ambulatory Visit: Payer: Self-pay | Admitting: Cardiology

## 2015-07-17 ENCOUNTER — Other Ambulatory Visit: Payer: Self-pay | Admitting: Cardiology

## 2015-07-24 ENCOUNTER — Encounter: Payer: Self-pay | Admitting: Cardiology

## 2015-08-13 ENCOUNTER — Ambulatory Visit: Payer: Medicare Other | Admitting: Cardiology

## 2015-08-22 ENCOUNTER — Ambulatory Visit: Payer: Medicare Other | Admitting: Cardiology

## 2015-09-15 ENCOUNTER — Ambulatory Visit: Payer: Medicare Other | Admitting: Cardiology

## 2015-10-13 ENCOUNTER — Encounter: Payer: Self-pay | Admitting: Cardiology

## 2015-10-13 ENCOUNTER — Ambulatory Visit (INDEPENDENT_AMBULATORY_CARE_PROVIDER_SITE_OTHER): Payer: Medicare Other | Admitting: Cardiology

## 2015-10-13 VITALS — BP 104/70 | HR 79 | Ht 68.0 in | Wt 182.8 lb

## 2015-10-13 DIAGNOSIS — I2510 Atherosclerotic heart disease of native coronary artery without angina pectoris: Secondary | ICD-10-CM | POA: Diagnosis not present

## 2015-10-13 DIAGNOSIS — I252 Old myocardial infarction: Secondary | ICD-10-CM | POA: Diagnosis not present

## 2015-10-13 DIAGNOSIS — I10 Essential (primary) hypertension: Secondary | ICD-10-CM | POA: Diagnosis not present

## 2015-10-13 DIAGNOSIS — E785 Hyperlipidemia, unspecified: Secondary | ICD-10-CM

## 2015-10-13 DIAGNOSIS — I2583 Coronary atherosclerosis due to lipid rich plaque: Secondary | ICD-10-CM

## 2015-10-13 NOTE — Progress Notes (Signed)
[facility]. [address], [address] [place], [place]  [address] Phone: [phone] Fax:  [phone]  Date:  10/13/2015   ID:  Karl Alvarez, DOB [birth year], MRN [id number]  PCP:  Gilford Rile, MD   History of Present Illness: Karl Alvarez is a 60 y.o. 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 He also has diabetes, hypertension , hyperlipidemia. ACE inhibitor cough in the past.   He has struggled with side effects of medications for quite some time. Currently he is battling metformin. I asked him to slowly increase this once again from 500 mg on up. He stopped his statin medication as well. ALT had been in the 60s. Side effects as well.   Overall he is pleased with his symptoms, no shortness of breath, no chest pain. No edema. Feeling much better. At last visit with Cecille Rubin in December, she did not hear a significant mitral regurgitation murmur. nor do I.  12/17/14 - has irreg HB at night after medication. HTN increased one night. Felt palpitations. 155/100. Took NTG. Made him anxious. Pulse felt like 7 beats fast and then pause and then another seven beats. Cost of Effient too high.    05/16/15 - still having irregular heart beat. Fast. BP dropped. HR then dropped to 48bpm. BP 98/50 for 8 hours. One episode. Heart rate usually over 100 bpm. Lots of back pain, Vicodin 6pm. Daily. Palpitations have gotten better. BP better. A1c 5.7 (metformin causes diarrhea off and on - hydocodone).   10/13/15-his main complaint is left lateral hip pain. Periodic. He also complained of neuropathy. After his heart attack he said he had left leg/foot numbness that resolved after 3 months. He also noted that he had 6 bad disks in his back  Heart palpitations are still present, usually at night feels as though his heart is flip-flop,  stops sensation. PVCs were noted on EKG. He is on carvedilol. Sometimes worries him   Wt Readings from Last 3 Encounters:  10/13/15 182 lb 12.8 oz (82.918 kg)  05/16/15 187 lb 1.9 oz (84.877 kg)  12/17/14 191 lb (86.637 kg)     Past Medical History  Diagnosis Date  . HTN (hypertension)   . DJD (degenerative joint disease)   . Diabetes ([place])   . CAD (coronary artery disease) 08/2014    Inferior STEMI with PCI/DES to RCA - well preserved LV function  . Mitral regurgitation   . HLD (hyperlipidemia)     Past Surgical History  Procedure Laterality Date  . Left heart catheterization with coronary angiogram N/A 08/14/2014    Procedure: LEFT HEART CATHETERIZATION WITH CORONARY ANGIOGRAM;  Surgeon: Wellington Hampshire, MD;  Location: [facility]ATH [facility];  Service: Cardiovascular;  Laterality: N/A;    Current Outpatient Prescriptions  Medication Sig Dispense Refill  . acetaminophen (TYLENOL) 650 MG CR tablet Take 1,300 mg by mouth daily.    Marland Kitchen aspirin EC 81 MG EC tablet Take 1 tablet (81 mg total) by mouth daily.    Marland Kitchen atorvastatin (LIPITOR) 10 MG tablet Take 10 mg by mouth daily at 6 PM.    . carvedilol (COREG) 6.25 MG tablet Take 1 tablet by mouth two  times daily with meals 180 tablet 2  . HYDROcodone-acetaminophen (NORCO/VICODIN) 5-325 MG per tablet Take 1 tablet by mouth every 4 (four) hours as  needed for moderate pain (Starting at 6pm daily until asleep).     . metFORMIN (GLUCOPHAGE) 500 MG tablet Take 1,000 mg by mouth 2 (two) times daily with a meal. Patient taking by mouth two 500 mg tabs in the am and two 500 mg tabs in the pm.    . nitroGLYCERIN (NITROSTAT) 0.4 MG SL tablet Place 1 tablet (0.4 mg total) under the tongue every 5 (five) minutes x 3 doses as needed for chest pain. 25 tablet 12   No current facility-administered medications for this visit.    Allergies:    Allergies  Allergen Reactions  . Altace [Ramipril] Cough    Social History:  The patient  reports that he has quit  smoking. He quit smokeless tobacco use about 41 years ago. His smokeless tobacco use included Chew.   Family History  Problem Relation Age of Onset  . Hypertension Mother   . Valvular heart disease Mother   . Hypertension Brother     ROS:  Please see the history of present illness.    Denies any rashes, syncope, bleeding, orthopnea  All other systems reviewed and negative.   PHYSICAL EXAM: VS:  BP 104/70 mmHg  Pulse 79  Ht 5\' 8"  (1.727 m)  Wt 182 lb 12.8 oz (82.918 kg)  BMI 27.80 kg/m2 Well nourished, well developed, in no acute distress HEENT: normal, [place]/AT, EOMI Neck: no JVD, normal carotid upstroke, no bruit Cardiac:  normal S1, S2; RRR; no murmur Lungs:  clear to auscultation bilaterally, no wheezing, rhonchi or rales Abd: soft, nontender, no hepatomegaly, no bruits Ext: no edema, 2+ distal pulses Skin: warm and dry GU: deferred Neuro: no focal abnormalities noted, AAO x 3  EKG:  EKG was done today-10/13/15-sinus rhythm, PVC, nonspecific ST-T wave changes, deep T-wave inversion noted in inferior leads, Q waves also noted in inferior leads, old inferior infarct pattern. Unchanged from prior EKG in 2015.  ASSESSMENT AND PLAN:  1. Inferior myocardial infarction December 2015 -RCA stent, drug-eluting, dual antiplatelet therapy Plavix. Continue with low-dose aspirin. Try to avoid excessive aspirin products.We will discontinue Plavix according to guidelines. 2.  Mitral regurgitation-repeat echocardiogram in March looked great.  3.  Diabetes-hemoglobin A1c was 7.7. Now in the 5 range. Excellent. Prior.  He has battled with metformin diarrhea and stomach upset.  He is very careful in counting his carbohydrates. 4.  Hyperlipidemia-LDL goal is 70. ALT previously was 62, now normal. 5. Palpitations-still present but have improved. PACs were noted on telemetry monitor during hospital. PVC noted on EKG continue with carvedilol. 6. EF normal.    Signed, Candee Furbish, MD [facility]  10/13/2015  3:14 PM

## 2015-10-13 NOTE — Patient Instructions (Addendum)
Medication Instructions:  You may stop your Plavix. Continue all other medications as listed.  Follow-Up: Follow up in 1 year with Dr. Marlou Porch.  You will receive a letter in the mail 2 months before you are due.  Please call us when you receive this letter to schedule your follow up appointment.  If you need a refill on your cardiac medications before your next appointment, please call your pharmacy.  Thank you for choosing [facility]!!

## 2015-11-09 ENCOUNTER — Emergency Department (HOSPITAL_COMMUNITY): Payer: Medicare Other

## 2015-11-09 ENCOUNTER — Emergency Department (HOSPITAL_COMMUNITY)
Admission: EM | Admit: 2015-11-09 | Discharge: 2015-11-09 | Disposition: A | Discharge status: Home / Self Care | Payer: Medicare Other | Attending: Emergency Medicine | Admitting: Emergency Medicine

## 2015-11-09 ENCOUNTER — Encounter (HOSPITAL_COMMUNITY): Payer: Self-pay | Admitting: *Deleted

## 2015-11-09 DIAGNOSIS — Z79899 Other long term (current) drug therapy: Secondary | ICD-10-CM | POA: Insufficient documentation

## 2015-11-09 DIAGNOSIS — E785 Hyperlipidemia, unspecified: Secondary | ICD-10-CM | POA: Diagnosis not present

## 2015-11-09 DIAGNOSIS — I10 Essential (primary) hypertension: Secondary | ICD-10-CM | POA: Insufficient documentation

## 2015-11-09 DIAGNOSIS — Z7982 Long term (current) use of aspirin: Secondary | ICD-10-CM | POA: Diagnosis not present

## 2015-11-09 DIAGNOSIS — Z7984 Long term (current) use of oral hypoglycemic drugs: Secondary | ICD-10-CM | POA: Diagnosis not present

## 2015-11-09 DIAGNOSIS — Z87891 Personal history of nicotine dependence: Secondary | ICD-10-CM | POA: Diagnosis not present

## 2015-11-09 DIAGNOSIS — R079 Chest pain, unspecified: Secondary | ICD-10-CM | POA: Diagnosis present

## 2015-11-09 DIAGNOSIS — M79602 Pain in left arm: Secondary | ICD-10-CM | POA: Insufficient documentation

## 2015-11-09 DIAGNOSIS — M549 Dorsalgia, unspecified: Secondary | ICD-10-CM | POA: Diagnosis not present

## 2015-11-09 DIAGNOSIS — Z9889 Other specified postprocedural states: Secondary | ICD-10-CM | POA: Diagnosis not present

## 2015-11-09 DIAGNOSIS — I2510 Atherosclerotic heart disease of native coronary artery without angina pectoris: Secondary | ICD-10-CM | POA: Insufficient documentation

## 2015-11-09 DIAGNOSIS — E119 Type 2 diabetes mellitus without complications: Secondary | ICD-10-CM | POA: Diagnosis not present

## 2015-11-09 DIAGNOSIS — B029 Zoster without complications: Secondary | ICD-10-CM | POA: Diagnosis not present

## 2015-11-09 LAB — BASIC METABOLIC PANEL
ANION GAP: 11 (ref 5–15)
BUN: 15 mg/dL (ref 6–20)
CALCIUM: 9.4 mg/dL (ref 8.9–10.3)
CHLORIDE: 99 mmol/L — AB (ref 101–111)
CO2: 27 mmol/L (ref 22–32)
CREATININE: 0.93 mg/dL (ref 0.61–1.24)
GFR calc non Af Amer: >60 mL/min (ref >60)
Glucose, Bld: 123 mg/dL — ABNORMAL HIGH (ref 65–99)
Potassium: 4.3 mmol/L (ref 3.5–5.1)
SODIUM: 137 mmol/L (ref 135–145)

## 2015-11-09 LAB — CBC
HCT: 40.8 % (ref 39.0–52.0)
HEMOGLOBIN: 13.4 g/dL (ref 13.0–17.0)
MCH: 29.5 pg (ref 26.0–34.0)
MCHC: 32.8 g/dL (ref 30.0–36.0)
MCV: 89.7 fL (ref 78.0–100.0)
PLATELETS: 211 10*3/uL (ref 150–400)
RBC: 4.55 MIL/uL (ref 4.22–5.81)
RDW: 13.1 % (ref 11.5–15.5)
WBC: 6.1 10*3/uL (ref 4.0–10.5)

## 2015-11-09 LAB — I-STAT TROPONIN, ED: TROPONIN I, POC: 0 ng/mL (ref 0.00–0.08)

## 2015-11-09 MED ORDER — OXYCODONE-ACETAMINOPHEN 5-325 MG PO TABS: 1.0000 | ORAL_TABLET | Freq: Four times a day (QID) | ORAL | Status: DC | PRN

## 2015-11-09 MED ORDER — PREDNISONE 10 MG PO TABS: 20.0000 mg | ORAL_TABLET | Freq: Two times a day (BID) | ORAL | Status: DC

## 2015-11-09 MED ORDER — VALACYCLOVIR HCL 1 G PO TABS: 1000.0000 mg | ORAL_TABLET | Freq: Three times a day (TID) | ORAL | Status: AC

## 2015-11-09 NOTE — Discharge Instructions (Signed)
Prednisone and valacyclovir as prescribed.  Percocet as prescribed as needed for pain.  Return to the emergency department if you develop worsening pain, difficulty breathing, or other new and concerning symptoms.   Shingles Shingles, which is also known as herpes zoster, is an infection that causes a painful skin rash and fluid-filled blisters. Shingles is not related to genital herpes, which is a sexually transmitted infection.   Shingles only develops in people who:  Have had chickenpox.  Have received the chickenpox vaccine. (This is rare.) CAUSES Shingles is caused by varicella-zoster virus (VZV). This is the same virus that causes chickenpox. After exposure to VZV, the virus stays in the body in an inactive (dormant) state. Shingles develops if the virus reactivates. This can happen many years after the initial exposure to VZV. It is not known what causes this virus to reactivate. RISK FACTORS People who have had chickenpox or received the chickenpox vaccine are at risk for shingles. Infection is more common in people who:  Are older than age 9.  Have a weakened defense (immune) system, such as those with HIV, AIDS, or cancer.  Are taking medicines that weaken the immune system, such as transplant medicines.  Are under great stress. SYMPTOMS Early symptoms of this condition include itching, tingling, and pain in an area on your skin. Pain may be described as burning, stabbing, or throbbing. A few days or weeks after symptoms start, a painful red rash appears, usually on one side of the body in a bandlike or beltlike pattern. The rash eventually turns into fluid-filled blisters that break open, scab over, and dry up in about 2-3 weeks. At any time during the infection, you may also develop:  A fever.  Chills.  A headache.  An upset stomach. DIAGNOSIS This condition is diagnosed with a skin exam. Sometimes, skin or fluid samples are taken from the blisters before a  diagnosis is made. These samples are examined under a microscope or sent to a lab for testing. TREATMENT There is no specific cure for this condition. Your health care provider will probably prescribe medicines to help you manage pain, recover more quickly, and avoid long-term problems. Medicines may include:  Antiviral drugs.  Anti-inflammatory drugs.  Pain medicines. If the area involved is on your face, you may be referred to a specialist, such as an eye doctor (ophthalmologist) or an ear, nose, and throat (ENT) doctor to help you avoid eye problems, chronic pain, or disability. HOME CARE INSTRUCTIONS Medicines  Take medicines only as directed by your health care provider.  Apply an anti-itch or numbing cream to the affected area as directed by your health care provider. Blister and Rash Care  Take a cool bath or apply cool compresses to the area of the rash or blisters as directed by your health care provider. This may help with pain and itching.  Keep your rash covered with a loose bandage (dressing). Wear loose-fitting clothing to help ease the pain of material rubbing against the rash.  Keep your rash and blisters clean with mild soap and cool water or as directed by your health care provider.  Check your rash every day for signs of infection. These include redness, swelling, and pain that lasts or increases.  Do not pick your blisters.  Do not scratch your rash. General Instructions  Rest as directed by your health care provider.  Keep all follow-up visits as directed by your health care provider. This is important.  Until your blisters scab over, your  infection can cause chickenpox in people who have never had it or been vaccinated against it. To prevent this from happening, avoid contact with other people, especially:  Babies.  Pregnant women.  Children who have eczema.  Elderly people who have transplants.  People who have chronic illnesses, such as leukemia or  AIDS. SEEK MEDICAL CARE IF:  Your pain is not relieved with prescribed medicines.  Your pain does not get better after the rash heals.  Your rash looks infected. Signs of infection include redness, swelling, and pain that lasts or increases. SEEK IMMEDIATE MEDICAL CARE IF:  The rash is on your face or nose.  You have facial pain, pain around your eye area, or loss of feeling on one side of your face.  You have ear pain or you have ringing in your ear.  You have loss of taste.  Your condition gets worse.   This information is not intended to replace advice given to you by your health care provider. Make sure you discuss any questions you have with your health care provider.   Document Released: 08/23/2005 Document Revised: 09/13/2014 Document Reviewed: 07/04/2014 Elsevier Interactive Patient Education [employer].

## 2015-11-09 NOTE — ED Notes (Signed)
Pt verbalized understanding of d/c instructions, prescriptions, and follow-up care. No further questions/concerns, VSS, ambulatory w/ steady gait (refused wheelchair) 

## 2015-11-09 NOTE — ED Provider Notes (Signed)
CSN: [id number]     Arrival date & time 11/09/15  1315 History   First MD Initiated Contact with Patient 11/09/15 1505     Chief Complaint  Patient presents with  . Chest Pain  . Back Pain     (Consider location/radiation/quality/duration/timing/severity/associated sxs/prior Treatment) HPI Comments: Patient is a 60 year old male with history of coronary artery disease, status post stent to the RCA in December 2015. He presents today with complaints of left arm pain for the past 3-4 days that has moved into his left shoulder and left lateral chest. He denies any shortness of breath, nausea, or diaphoresis. This feels different than his prior heart pain. This afternoon he broke out in a rash in this area. He denies any fevers or chills.  Patient is a 60 y.o. male presenting with chest pain and back pain. The history is provided by the patient.  Chest Pain Pain location:  L chest Pain quality: burning   Pain radiates to:  L arm and L shoulder Pain radiates to the back: yes   Pain severity:  Moderate Onset quality:  Gradual Duration:  3 days Timing:  Constant Progression:  Worsening Chronicity:  New Relieved by:  Nothing Worsened by:  Nothing tried Ineffective treatments:  None tried Associated symptoms: back pain   Back Pain Associated symptoms: chest pain     Past Medical History  Diagnosis Date  . HTN (hypertension)   . DJD (degenerative joint disease)   . Diabetes ([place])   . CAD (coronary artery disease) 08/2014    Inferior STEMI with PCI/DES to RCA - well preserved LV function  . Mitral regurgitation   . HLD (hyperlipidemia)    Past Surgical History  Procedure Laterality Date  . Left heart catheterization with coronary angiogram N/A 08/14/2014    Procedure: LEFT HEART CATHETERIZATION WITH CORONARY ANGIOGRAM;  Surgeon: Wellington Hampshire, MD;  Location: [facility];  Service: Cardiovascular;  Laterality: N/A;   Family History  Problem Relation Age of Onset  . Hypertension  Mother   . Valvular heart disease Mother   . Hypertension Brother    Social History  Substance Use Topics  . Smoking status: Former Research scientist (life sciences)  . Smokeless tobacco: Former Systems developer    Types: [place] date: 08/14/1974  . Alcohol Use: None    Review of Systems  Cardiovascular: Positive for chest pain.  Musculoskeletal: Positive for back pain.  All other systems reviewed and are negative.     Allergies  Altace  Home Medications   Prior to Admission medications   Medication Sig Start Date End Date Taking? Authorizing Provider  acetaminophen (TYLENOL) 650 MG CR tablet Take 1,300 mg by mouth daily.    Historical Provider, MD  aspirin EC 81 MG EC tablet Take 1 tablet (81 mg total) by mouth daily. 08/18/14   Brett Canales, PA-C  atorvastatin (LIPITOR) 10 MG tablet Take 10 mg by mouth daily at 6 PM.    Historical Provider, MD  carvedilol (COREG) 6.25 MG tablet Take 1 tablet by mouth two  times daily with meals 07/17/15   Jerline Pain, MD  HYDROcodone-acetaminophen (NORCO/VICODIN) 5-325 MG per tablet Take 1 tablet by mouth every 4 (four) hours as needed for moderate pain (Starting at 6pm daily until asleep).     Historical Provider, MD  metFORMIN (GLUCOPHAGE) 500 MG tablet Take 1,000 mg by mouth 2 (two) times daily with a meal. Patient taking by mouth two 500 mg tabs in the am  and two 500 mg tabs in the pm.    Historical Provider, MD  nitroGLYCERIN (NITROSTAT) 0.4 MG SL tablet Place 1 tablet (0.4 mg total) under the tongue every 5 (five) minutes x 3 doses as needed for chest pain. 08/18/14   Brett Canales, PA-C   BP 146/92 mmHg  Pulse 85  Temp(Src) 98.5 F (36.9 C) (Oral)  Resp 18  SpO2 97% Physical Exam  Constitutional: He is oriented to person, place, and time. He appears well-developed and well-nourished. No distress.  HENT:  Head: Normocephalic and atraumatic.  Neck: Normal range of motion. Neck supple.  Cardiovascular: Normal rate, regular rhythm and normal heart sounds.   No  murmur heard. Pulmonary/Chest: Effort normal and breath sounds normal. No respiratory distress. He has no wheezes.  Abdominal: Soft. Bowel sounds are normal.  Musculoskeletal: Normal range of motion.  Neurological: He is alert and oriented to person, place, and time.  Skin: Skin is warm. He is not diaphoretic.  There is a vesicular rash in a dermatomal pattern noted to the left upper chest, left shoulder, and inside of the left arm.  Nursing note and vitals reviewed.   ED Course  Procedures (including critical care time) Labs Review Labs Reviewed  BASIC METABOLIC PANEL - Abnormal; Notable for the following:    Chloride 99 (*)    Glucose, Bld 123 (*)    All other components within normal limits  CBC  I-STAT TROPOININ, ED    Imaging Review Dg Chest 2 View  11/09/2015  CLINICAL DATA:  LEFT side chest pain and pain under LEFT arm for several days radiating around to LEFT side of back, history coronary artery disease post MI 15 months ago, hypertension, diabetes mellitus, former smoker EXAM: CHEST  2 VIEW COMPARISON:  08/17/2014 FINDINGS: Normal heart size, mediastinal contours, and pulmonary vascularity. Lungs clear. No pulmonary infiltrate, pleural effusion or pneumothorax. Bones demineralized. IMPRESSION: No acute abnormalities. Electronically Signed   By: Lavonia Dana M.D.   On: 11/09/2015 14:23   I have personally reviewed and evaluated these images and lab results as part of my medical decision-making.   EKG Interpretation None      MDM   Final diagnoses:  None    Patient presents with several days worth of pain followed by a rash that is very classic for a shingles. His cardiac workup is negative. His EKG shows inferior lead abnormalities consistent with his prior MI. Troponin is negative. He will be treated with prednisone, valacyclovir, and Percocet for pain. He is to return to the ER if symptoms significantly worsen or change.    Veryl Speak, MD 11/09/15 [phone]

## 2015-11-09 NOTE — ED Notes (Signed)
Pt reports left side chest pain and pain under left arm for several days, radiates around to left side of back. Has cardiac hx, also reports recent strenous activity. ekg done at triage and no acute distress noted.

## 2015-11-11 DIAGNOSIS — S61211A Laceration without foreign body of left index finger without damage to nail, initial encounter: Secondary | ICD-10-CM | POA: Diagnosis not present

## 2016-02-03 ENCOUNTER — Other Ambulatory Visit: Payer: Self-pay | Admitting: Cardiology

## 2016-09-23 ENCOUNTER — Other Ambulatory Visit: Payer: Self-pay | Admitting: Cardiology

## 2016-10-13 ENCOUNTER — Ambulatory Visit (INDEPENDENT_AMBULATORY_CARE_PROVIDER_SITE_OTHER): Payer: Medicare Other | Admitting: Cardiology

## 2016-10-13 ENCOUNTER — Encounter: Payer: Self-pay | Admitting: Cardiology

## 2016-10-13 ENCOUNTER — Encounter (INDEPENDENT_AMBULATORY_CARE_PROVIDER_SITE_OTHER): Payer: Self-pay

## 2016-10-13 VITALS — BP 122/84 | HR 88 | Ht 68.0 in | Wt 178.4 lb

## 2016-10-13 DIAGNOSIS — I2583 Coronary atherosclerosis due to lipid rich plaque: Secondary | ICD-10-CM

## 2016-10-13 DIAGNOSIS — I10 Essential (primary) hypertension: Secondary | ICD-10-CM

## 2016-10-13 DIAGNOSIS — I2510 Atherosclerotic heart disease of native coronary artery without angina pectoris: Secondary | ICD-10-CM | POA: Diagnosis not present

## 2016-10-13 DIAGNOSIS — I252 Old myocardial infarction: Secondary | ICD-10-CM

## 2016-10-13 DIAGNOSIS — I340 Nonrheumatic mitral (valve) insufficiency: Secondary | ICD-10-CM | POA: Diagnosis not present

## 2016-10-13 NOTE — Patient Instructions (Signed)

## 2016-10-13 NOTE — Progress Notes (Signed)
[address] [place], [place]  [address] Phone: [phone] Fax:  [phone]  Date:  10/13/2016   ID:  Karl Alvarez, DOB [birth year], MRN [id number]  PCP:  Gilford Rile, MD   History of Present Illness: Karl Alvarez is a 61 y.o. 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 He also has diabetes, hypertension , hyperlipidemia. ACE inhibitor cough in the past.   He has struggled with side effects of medications for quite some time. Currently he is battling metformin. I asked him to slowly increase this once again from 500 mg on up. He stopped his statin medication as well. ALT had been in the 60s. Side effects as well.   Overall he is pleased with his symptoms, no shortness of breath, no chest pain. No edema. Feeling much better. At last visit with Cecille Rubin in December, she did not hear a significant mitral regurgitation murmur. nor do I.  12/17/14 - has irreg HB at night after medication. HTN increased one night. Felt palpitations. 155/100. Took NTG. Made him anxious. Pulse felt like 7 beats fast and then pause and then another seven beats. Cost of Effient too high.    05/16/15 - still having irregular heart beat. Fast. BP dropped. HR then dropped to 48bpm. BP 98/50 for 8 hours. One episode. Heart rate usually over 100 bpm. Lots of back pain, Vicodin 6pm. Daily. Palpitations have gotten better. BP better. A1c 5.7 (metformin causes diarrhea off and on - hydocodone).   10/13/15-his main complaint is left lateral hip pain. Periodic. He also complained of neuropathy. After his heart attack he said he had left leg/foot numbness that resolved after 3 months. He also noted that he had 6 bad disks in his back  Heart palpitations are still present, usually at night feels as though his heart is flip-flop,  stops sensation. PVCs were noted on EKG. He is on carvedilol. Sometimes worries him   10/13/16 - Had shingles. Worse than heart attack. 6 months to rid of pain.   Wt Readings from Last 3 Encounters:  10/13/16 178 lb 6.4 oz (80.9 kg)  10/13/15 182 lb 12.8 oz (82.9 kg)  05/16/15 187 lb 1.9 oz (84.9 kg)     Past Medical History:  Diagnosis Date  . CAD (coronary artery disease) 08/2014   Inferior STEMI with PCI/DES to RCA - well preserved LV function  . Diabetes ([place])   . DJD (degenerative joint disease)   . HLD (hyperlipidemia)   . HTN (hypertension)   . Mitral regurgitation     Past Surgical History:  Procedure Laterality Date  . LEFT HEART CATHETERIZATION WITH CORONARY ANGIOGRAM N/A 08/14/2014   Procedure: LEFT HEART CATHETERIZATION WITH CORONARY ANGIOGRAM;  Surgeon: Wellington Hampshire, MD;  Location: [facility];  Service: Cardiovascular;  Laterality: N/A;    Current Outpatient Prescriptions  Medication Sig Dispense Refill  . aspirin EC 81 MG EC tablet Take 1 tablet (81 mg total) by mouth daily.    Marland Kitchen aspirin-sod bicarb-citric acid (ALKA-SELTZER) 325 MG TBEF tablet Take 325 mg by mouth at bedtime.    Marland Kitchen atorvastatin (LIPITOR) 10 MG tablet Take 10 mg by mouth daily at 6 PM.    . carvedilol (COREG) 6.25 MG tablet TAKE 1 TABLET BY MOUTH TWO  TIMES DAILY WITH MEALS 180 tablet  0  . HYDROcodone-acetaminophen (NORCO/VICODIN) 5-325 MG per tablet Take 1 tablet by mouth every 4 (four) hours as needed for moderate pain (Starting at 6pm daily until asleep).     . metFORMIN (GLUCOPHAGE) 500 MG tablet Take 1,000 mg by mouth 2 (two) times daily with a meal. Patient taking by mouth two 500 mg tabs in the am and two 500 mg tabs in the pm.    . nitroGLYCERIN (NITROSTAT) 0.4 MG SL tablet Place 1 tablet (0.4 mg total) under the tongue every 5 (five) minutes x 3 doses as needed for chest pain. 25 tablet 12  . oxyCODONE-acetaminophen (PERCOCET) 5-325 MG tablet Take 1-2 tablets by mouth every 6 (six) hours as  needed. 15 tablet 0  . predniSONE (DELTASONE) 10 MG tablet Take 2 tablets (20 mg total) by mouth 2 (two) times daily. 20 tablet 0   No current facility-administered medications for this visit.     Allergies:    Allergies  Allergen Reactions  . Altace [Ramipril] Cough    Social History:  The patient  reports that he has quit smoking. He quit smokeless tobacco use about 42 years ago. His smokeless tobacco use included Chew.   Family History  Problem Relation Age of Onset  . Hypertension Mother   . Valvular heart disease Mother   . Hypertension Brother     ROS:  Please see the history of present illness.    Denies any rashes, syncope, bleeding, orthopnea  All other systems reviewed and negative.   PHYSICAL EXAM: VS:  BP 122/84   Pulse 88   Ht 5\' 8"  (1.727 m)   Wt 178 lb 6.4 oz (80.9 kg)   BMI 27.13 kg/m  Well nourished, well developed, in no acute distress  HEENT: normal, [place]/AT, EOMI Neck: no JVD, normal carotid upstroke, no bruit Cardiac:  normal S1, S2; RRR; no murmur  Lungs:  clear to auscultation bilaterally, no wheezing, rhonchi or rales  Abd: soft, nontender, no hepatomegaly, no bruits  Ext: no edema, 2+ distal pulses Skin: warm and dry  GU: deferred Neuro: no focal abnormalities noted, AAO x 3  EKG:  EKG was done today-10/13/16-sinus rhythm 42 old inferior infarct pattern personally viewed 10/13/15-sinus rhythm, PVC, nonspecific ST-T wave changes, deep T-wave inversion noted in inferior leads, Q waves also noted in inferior leads, old inferior infarct pattern. Unchanged from prior EKG in 2015.  ASSESSMENT AND PLAN:  1. Inferior myocardial infarction December 2015 -RCA stent, drug-eluting, dual antiplatelet therapy Plavix. Continue with low-dose aspirin. Try to avoid excessive aspirin products.We discontinued Plavix according to guidelines. Shoulder pain. Deer hunting work lifting, minor strain to his shoulder..  2.  Mitral regurgitation-repeat echocardiogram in March  looked great.  3.  Diabetes-hemoglobin A1c was 7.7. Now in the 5 range. Excellent. Prior.  He has battled with metformin diarrhea and stomach upset.  He is very careful in counting his carbohydrates. 4.  Hyperlipidemia-LDL goal is 70. ALT previously was 62, now normal. 5. Palpitations-still present but have improved. PACs were noted on telemetry monitor during hospital. PVC noted on EKG continue with carvedilol. 6. EF normal.   One-year follow-up.   Signed, Candee Furbish, MD [facility]  10/13/2016 2:12 PM

## 2017-01-04 ENCOUNTER — Other Ambulatory Visit: Payer: Self-pay | Admitting: Cardiology

## 2017-02-03 ENCOUNTER — Telehealth: Payer: Self-pay | Admitting: Cardiology

## 2017-02-03 NOTE — Telephone Encounter (Signed)
New Message    Pt is going to have MRI at [facility], is this ok, will it effect his stent?

## 2017-02-03 NOTE — Telephone Encounter (Signed)
.   CAD (coronary artery disease) 08/2014   Inferior STEMI with PCI/DES to RCA - well preserved LV function    Will verify with Dr Marlou Porch.

## 2017-02-04 NOTE — Telephone Encounter (Signed)
Reviewed with Dr Marlou Porch.  OK to have MRI.  Pt aware and thanked me for the call.

## 2017-02-10 IMAGING — CR DG CHEST 2V
2 series · 2 of 2 positions shown · non-contrast
Comparison: 08/17/2014

CLINICAL DATA: LEFT side chest pain and pain under LEFT arm for
several days radiating around to LEFT side of back, history coronary
artery disease post MI 15 months ago, hypertension, diabetes
mellitus, former smoker

EXAM:
CHEST  2 VIEW

[chest pa]
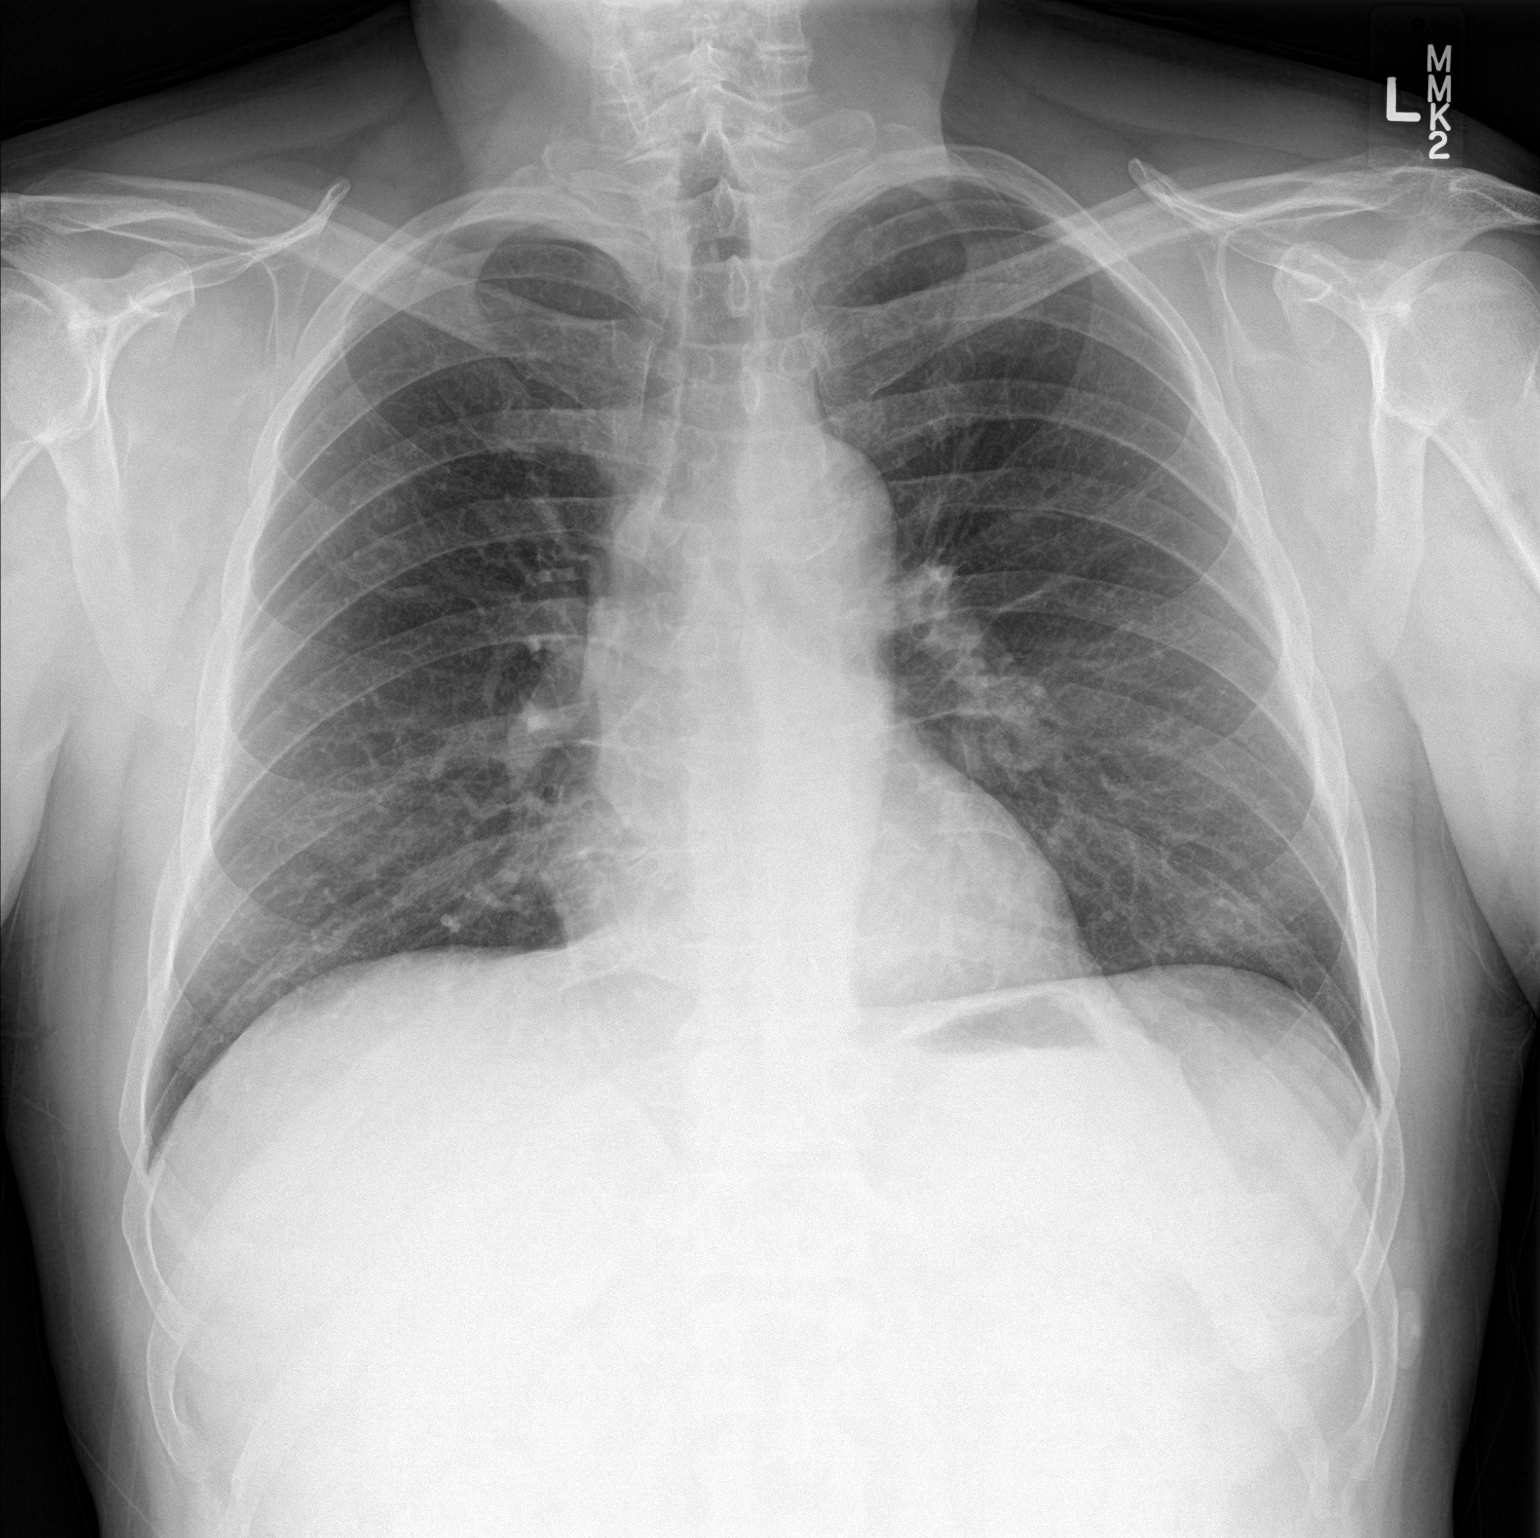

[chest lat]
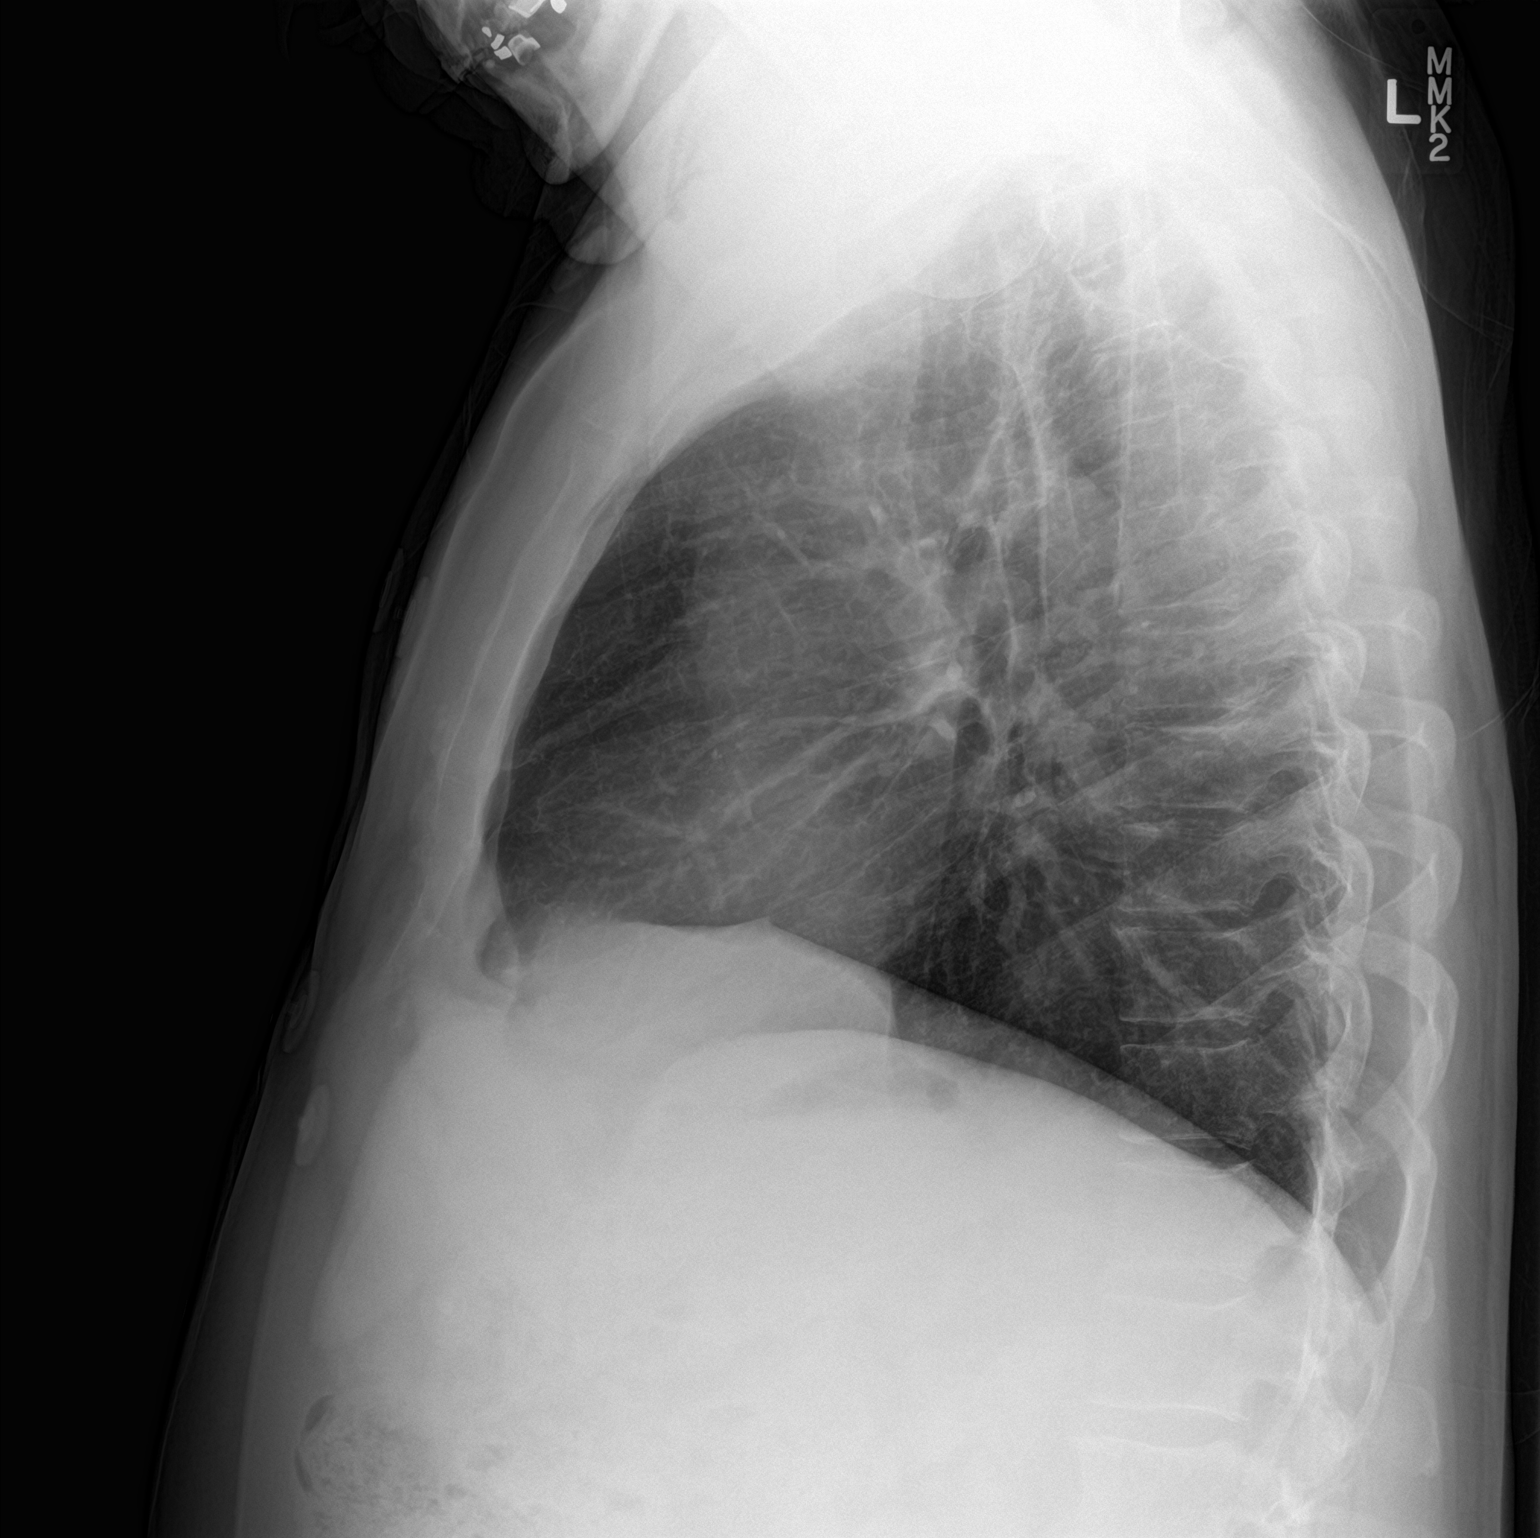

[2 of 2 positions shown; findings below may reference images not displayed]

FINDINGS: Normal heart size, mediastinal contours, and pulmonary vascularity.

Lungs clear.

No pulmonary infiltrate, pleural effusion or pneumothorax.

Bones demineralized.
IMPRESSION: No acute abnormalities.

## 2017-05-23 ENCOUNTER — Telehealth: Payer: Self-pay

## 2017-05-23 NOTE — Telephone Encounter (Signed)
ROI fax to [facility]

## 2017-05-31 ENCOUNTER — Telehealth: Payer: Self-pay | Admitting: Internal Medicine

## 2017-06-13 ENCOUNTER — Encounter: Payer: Self-pay | Admitting: Internal Medicine

## 2017-06-13 NOTE — Telephone Encounter (Signed)
Dr. Henrene Pastor reviewed records and patient will need a OV.  Called and LM on Vmail to call back to schedule

## 2017-08-08 ENCOUNTER — Encounter: Payer: Self-pay | Admitting: Internal Medicine

## 2017-08-08 ENCOUNTER — Ambulatory Visit: Payer: Medicare Other | Admitting: Internal Medicine

## 2017-08-08 ENCOUNTER — Encounter (INDEPENDENT_AMBULATORY_CARE_PROVIDER_SITE_OTHER): Payer: Self-pay

## 2017-08-08 VITALS — BP 124/70 | HR 68 | Ht 68.0 in | Wt 190.0 lb

## 2017-08-08 DIAGNOSIS — K222 Esophageal obstruction: Secondary | ICD-10-CM

## 2017-08-08 DIAGNOSIS — M533 Sacrococcygeal disorders, not elsewhere classified: Secondary | ICD-10-CM | POA: Diagnosis not present

## 2017-08-08 DIAGNOSIS — K219 Gastro-esophageal reflux disease without esophagitis: Secondary | ICD-10-CM | POA: Diagnosis not present

## 2017-08-08 DIAGNOSIS — Z86010 Personal history of colonic polyps: Secondary | ICD-10-CM | POA: Diagnosis not present

## 2017-08-08 MED ORDER — NA SULFATE-K SULFATE-MG SULF 17.5-3.13-1.6 GM/177ML PO SOLN: 1.0000 | Freq: Once | ORAL | 0 refills | Status: AC

## 2017-08-08 NOTE — Progress Notes (Signed)
HISTORY OF PRESENT ILLNESS:  Karl Alvarez is a 61 y.o. male with multiple medical problems, on disability due to shoulder injuries, as listed below including coronary artery disease, hypertension, and hyperlipidemia who presents, upon referral from his primary care provider Dr. Gilford Alvarez, today regarding surveillance colonoscopy and complaints of a sore tailbone. I saw the patient 18 years ago when he underwent upper endoscopy for intermittent solid food dysphagia with transient food impactions. He was noted to have an esophageal stricture but declined having therapeutic esophageal dilation performed. I have not seen the patient since. Has been to several other gastroenterologists over the years including Dr. Lyndel Alvarez in [place] and most recently Dr. Redmond Alvarez in [facility]. I have reviewed some outside records from his most recent GI procedures. Both upper endoscopy with biopsies and colonoscopy were performed 05/08/2013. Upper endoscopy was unremarkable with minimal change on biopsies. Colonoscopy revealed several colon polyps which were both adenomatous and hyperplastic. Patient reports that he was told that he is due for follow-up surveillance. He reports to me that he no longer has reflux symptoms or problems with dysphagia since having had his heart attack and coronary artery stent placed 4 years ago. He is not on PPI. Next, he reports a greater than 3 year history of soreness of his tailbone when sitting for prolonged periods of time, particularly on hard surfaces. Tells me that he underwent urology evaluation including physical therapy to address this issue. Workup was negative and therapy did not help. He also tells me that he saw orthopedic surgery for the symptom. Workup included plain films and MRI without cause found. He wonders if it may be related to his colon. He wonders about some injury to his colon from his last colonoscopy. His GI review of systems is otherwise negative. He tells me that the  combination of metformin and hydrocodone keep his bowels regular. No family history of colon cancer  REVIEW OF SYSTEMS:  All non-GI ROS negative unless otherwise stated in the history of present illness except for back pain and sleeping problems  Past Medical History:  Diagnosis Date  . Anxiety   . CAD (coronary artery disease) 08/2014   Inferior STEMI with PCI/DES to RCA - well preserved LV function  . Colon polyps   . Diabetes ([place])   . DJD (degenerative joint disease)   . Gastric polyps   . History of BPH   . HLD (hyperlipidemia)   . HTN (hypertension)   . Internal hemorrhoids   . Mitral regurgitation   . Osteoarthritis   . Overweight   . Salmonella gastroenteritis     Past Surgical History:  Procedure Laterality Date  . LEFT HEART CATHETERIZATION WITH CORONARY ANGIOGRAM N/A 08/14/2014   Procedure: LEFT HEART CATHETERIZATION WITH CORONARY ANGIOGRAM;  Surgeon: Karl Hampshire, MD;  Location: [facility];  Service: Cardiovascular;  Laterality: N/A;    Social History Karl Alvarez  reports that he has quit smoking. He quit smokeless tobacco use about 43 years ago. His smokeless tobacco use included chew. His alcohol and drug histories are not on file.  family history includes Hypertension in his brother and mother; Valvular heart disease in his mother.  Allergies  Allergen Reactions  . Altace [Ramipril] Cough       PHYSICAL EXAMINATION: Vital signs: BP 124/70   Pulse 68   Ht 5\' 8"  (1.727 m)   Wt 190 lb (86.2 kg)   BMI 28.89 kg/m   Constitutional: generally well-appearing, no acute distress Psychiatric:  alert and oriented x3, cooperative Eyes: extraocular movements intact, anicteric, conjunctiva pink Mouth: oral pharynx moist, no lesions Neck: supple no lymphadenopathy Cardiovascular: heart regular rate and rhythm, no murmur Lungs: clear to auscultation bilaterally Abdomen: soft, nontender, nondistended, no obvious ascites, no peritoneal signs, normal  bowel sounds, no organomegaly Rectal: Deferred until colonoscopy Extremities: no clubbing, cyanosis, or lower extremity edema bilaterally Skin: no lesions on visible extremities Neuro: No focal deficits. Cranial nerves intact  ASSESSMENT:  #1. History of GERD with esophageal stricture. The patient reports being asymptomatic off medication #2. History of adenomatous colon polyps. Due for surveillance #3. Coccyx pain #4. Multiple medical problems. Stable   PLAN:  #1. Sit on foam doughnut to reduce coccyx discomfort #2. See PCP for persistent symptoms as this is out of my area of care #3. Schedule surveillance colonoscopy.The nature of the procedure, as well as the risks, benefits, and alternatives were carefully and thoroughly reviewed with the patient. Ample time for discussion and questions allowed. The patient understood, was satisfied, and agreed to proceed. #4. Hold diabetic medications the day of the procedure to avoid and wanted hypoglycemia   A copy of this consultation note has been sent to Dr. Bea Alvarez

## 2017-08-08 NOTE — Patient Instructions (Signed)

## 2017-08-11 ENCOUNTER — Other Ambulatory Visit: Payer: Self-pay

## 2017-08-11 ENCOUNTER — Encounter: Payer: Self-pay | Admitting: Internal Medicine

## 2017-08-11 ENCOUNTER — Ambulatory Visit (AMBULATORY_SURGERY_CENTER): Payer: Medicare Other | Admitting: Internal Medicine

## 2017-08-11 VITALS — BP 162/91 | HR 63 | Temp 98.9°F | Resp 13 | Ht 68.0 in | Wt 190.0 lb

## 2017-08-11 DIAGNOSIS — Z86010 Personal history of colonic polyps: Secondary | ICD-10-CM

## 2017-08-11 DIAGNOSIS — M533 Sacrococcygeal disorders, not elsewhere classified: Secondary | ICD-10-CM

## 2017-08-11 MED ORDER — SODIUM CHLORIDE 0.9 % IV SOLN: 500.0000 mL | INTRAVENOUS | Status: DC

## 2017-08-11 NOTE — Patient Instructions (Signed)

## 2017-08-11 NOTE — Op Note (Signed)
[name] Procedure Date: 08/11/2017 3:09 PM MRN: [id number] Endoscopist: Docia Chuck. Karl Alvarez , MD Age: 61 Referring MD:  Date of Birth: [birth year] Gender: Male Account #: [id number] Procedure:                Colonoscopy Indications:              High risk colon cancer surveillance: Personal                            history of multiple (3 or more) adenomas. Previous                            examinations elsewhere. Last examination September                            2014 with adenomatous polyps Medicines:                Monitored Anesthesia Care Procedure:                Pre-Anesthesia Assessment:                           - Prior to the procedure, a History and Physical                            was performed, and patient medications and                            allergies were reviewed. The patient's tolerance of                            previous anesthesia was also reviewed. The risks                            and benefits of the procedure and the sedation                            options and risks were discussed with the patient.                            All questions were answered, and informed consent                            was obtained. Prior Anticoagulants: The patient has                            taken no previous anticoagulant or antiplatelet                            agents. After reviewing the risks and benefits, the                            patient was deemed in satisfactory condition to  undergo the procedure.                           After obtaining informed consent, the colonoscope                            was passed under direct vision. Throughout the                            procedure, the patient's blood pressure, pulse, and                            oxygen saturations were monitored continuously. The                            Colonoscope was introduced through the anus and                             advanced to the the cecum, identified by                            appendiceal orifice and ileocecal valve. The                            ileocecal valve, appendiceal orifice, and rectum                            were photographed. The quality of the bowel                            preparation was excellent. The colonoscopy was                            performed without difficulty. The patient tolerated                            the procedure well. The bowel preparation used was                            SUPREP. Scope In: 3:15:33 PM Scope Out: 3:31:23 PM Scope Withdrawal Time: 0 hours 13 minutes 32 seconds  Total Procedure Duration: 0 hours 15 minutes 50 seconds  Findings:                 The entire examined colon appeared normal on direct                            and retroflexion views. Complications:            No immediate complications. Estimated blood loss:                            None. Estimated Blood Loss:     Estimated blood loss: none. Impression:               - The entire examined colon is normal on direct and  retroflexion views.                           - No specimens collected. Recommendation:           - Repeat colonoscopy in 5 years for surveillance.                           - Patient has a contact number available for                            emergencies. The signs and symptoms of potential                            delayed complications were discussed with the                            patient. Return to normal activities tomorrow.                            Written discharge instructions were provided to the                            patient.                           - Resume previous diet.                           - Continue present medications. Docia Chuck. Karl Pastor, MD 08/11/2017 3:35:04 PM This report has been signed electronically.

## 2017-08-11 NOTE — Progress Notes (Signed)
A and O x3. Report to RN. Tolerated MAC anesthesia well.

## 2017-08-11 NOTE — Progress Notes (Signed)
Pt's states no medical or surgical changes since previsit or office visit. 

## 2017-08-12 ENCOUNTER — Telehealth: Payer: Self-pay | Admitting: *Deleted

## 2017-08-12 NOTE — Telephone Encounter (Signed)
  Follow up Call-  Call back number 08/11/2017  Post procedure Call Back phone  # [phone]  Permission to leave phone message Yes  Some recent data might be hidden     Patient questions:  Do you have a fever, pain , or abdominal swelling? No. Pain Score  0 *  Have you tolerated food without any problems? Yes.    Have you been able to return to your normal activities? Yes.    Do you have any questions about your discharge instructions: Diet   No. Medications  No. Follow up visit  No.  Do you have questions or concerns about your Care? No.  Actions: * If pain score is 4 or above: No action needed, pain <4.

## 2017-08-21 ENCOUNTER — Other Ambulatory Visit: Payer: Self-pay | Admitting: Cardiology

## 2017-09-08 ENCOUNTER — Emergency Department (HOSPITAL_COMMUNITY): Payer: Medicare Other

## 2017-09-08 ENCOUNTER — Encounter (HOSPITAL_COMMUNITY): Payer: Self-pay

## 2017-09-08 ENCOUNTER — Emergency Department (HOSPITAL_COMMUNITY)
Admission: EM | Admit: 2017-09-08 | Discharge: 2017-09-08 | Discharge status: Against Medical Advice | Payer: Medicare Other | Attending: Emergency Medicine | Admitting: Emergency Medicine

## 2017-09-08 ENCOUNTER — Other Ambulatory Visit: Payer: Self-pay

## 2017-09-08 DIAGNOSIS — M25512 Pain in left shoulder: Secondary | ICD-10-CM | POA: Diagnosis present

## 2017-09-08 DIAGNOSIS — Z5321 Procedure and treatment not carried out due to patient leaving prior to being seen by health care provider: Secondary | ICD-10-CM | POA: Diagnosis not present

## 2017-09-08 LAB — BASIC METABOLIC PANEL
Anion gap: 10 (ref 5–15)
BUN: 14 mg/dL (ref 6–20)
CHLORIDE: 102 mmol/L (ref 101–111)
CO2: 25 mmol/L (ref 22–32)
Calcium: 9.6 mg/dL (ref 8.9–10.3)
Creatinine, Ser: 1.13 mg/dL (ref 0.61–1.24)
GFR calc Af Amer: >60 mL/min (ref >60)
GFR calc non Af Amer: >60 mL/min (ref >60)
Glucose, Bld: 135 mg/dL — ABNORMAL HIGH (ref 65–99)
POTASSIUM: 4 mmol/L (ref 3.5–5.1)
Sodium: 137 mmol/L (ref 135–145)

## 2017-09-08 LAB — CBC
HEMATOCRIT: 40.1 % (ref 39.0–52.0)
Hemoglobin: 13.3 g/dL (ref 13.0–17.0)
MCH: 29.4 pg (ref 26.0–34.0)
MCHC: 33.2 g/dL (ref 30.0–36.0)
MCV: 88.7 fL (ref 78.0–100.0)
Platelets: 229 10*3/uL (ref 150–400)
RBC: 4.52 MIL/uL (ref 4.22–5.81)
RDW: 12.9 % (ref 11.5–15.5)
WBC: 17 10*3/uL — AB (ref 4.0–10.5)

## 2017-09-08 LAB — I-STAT TROPONIN, ED: Troponin i, poc: 0 ng/mL (ref 0.00–0.08)

## 2017-09-08 LAB — I-STAT CG4 LACTIC ACID, ED: Lactic Acid, Venous: 1.72 mmol/L (ref 0.5–1.9)

## 2017-09-08 NOTE — ED Notes (Signed)
Pt states that he has waited here all day and is going home. Pt verbalized understanding of risks of leaving without being seen by a EDP.

## 2017-09-08 NOTE — ED Triage Notes (Signed)
Per EMS, Pt is coming from UC with complaints of Left shoulder pain that started this morning and subsided. Reports some chest discomfort from coughing. Productive Cough x 2 weeks that worsened today. Hx of Pneumonia and MI.

## 2017-10-28 ENCOUNTER — Encounter: Payer: Self-pay | Admitting: Cardiology

## 2017-10-28 ENCOUNTER — Ambulatory Visit: Payer: Medicare Other | Admitting: Cardiology

## 2017-10-28 VITALS — BP 124/82 | HR 86 | Ht 68.0 in | Wt 192.0 lb

## 2017-10-28 DIAGNOSIS — I2583 Coronary atherosclerosis due to lipid rich plaque: Secondary | ICD-10-CM

## 2017-10-28 DIAGNOSIS — I340 Nonrheumatic mitral (valve) insufficiency: Secondary | ICD-10-CM | POA: Diagnosis not present

## 2017-10-28 DIAGNOSIS — I2510 Atherosclerotic heart disease of native coronary artery without angina pectoris: Secondary | ICD-10-CM | POA: Diagnosis not present

## 2017-10-28 DIAGNOSIS — I252 Old myocardial infarction: Secondary | ICD-10-CM | POA: Diagnosis not present

## 2017-10-28 DIAGNOSIS — I10 Essential (primary) hypertension: Secondary | ICD-10-CM

## 2017-10-28 NOTE — Patient Instructions (Signed)

## 2017-10-28 NOTE — Progress Notes (Signed)
[name] [address] [place], [place]  [address] Phone: [phone] Fax:  [phone]  Date:  10/28/2017   ID:  Karl Alvarez, DOB 11-19-1955, MRN [id number]  PCP:  Raina Mina., MD   History of Present Illness: Karl Alvarez is a 62 y.o. 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 He also has diabetes, hypertension , hyperlipidemia. ACE inhibitor cough in the past.   He has struggled with side effects of medications for quite some time. Currently he is battling metformin. I asked him to slowly increase this once again from 500 mg on up. He stopped his statin medication as well. ALT had been in the 60s. Side effects as well.   Overall he is pleased with his symptoms, no shortness of breath, no chest pain. No edema. Feeling much better. At last visit with Cecille Rubin in December, she did not hear a significant mitral regurgitation murmur. nor do I.  12/17/14 - has irreg HB at night after medication. HTN increased one night. Felt palpitations. 155/100. Took NTG. Made him anxious. Pulse felt like 7 beats fast and then pause and then another seven beats. Cost of Effient too high.    05/16/15 - still having irregular heart beat. Fast. BP dropped. HR then dropped to 48bpm. BP 98/50 for 8 hours. One episode. Heart rate usually over 100 bpm. Lots of back pain, Vicodin 6pm. Daily. Palpitations have gotten better. BP better. A1c 5.7 (metformin causes diarrhea off and on - hydocodone).   10/13/15-his main complaint is left lateral hip pain. Periodic. He also complained of neuropathy. After his heart attack he said he had left leg/foot numbness that resolved after 3 months. He also noted that he had 6 bad disks in his back  Heart palpitations are still present, usually at night feels as though his heart is  flip-flop, stops sensation. PVCs were noted on EKG. He is on carvedilol. Sometimes worries him   10/13/16 - Had shingles. Worse than heart attack. 6 months to rid of pain.   10/28/17 - back pain, urologist, tail bone, prostate area. If sat hard place, hurt when stood up. Sharp pinecone sensation. How long sat increased the pain.  Had an episode in January 2019 with shaking chills, fevers.  Flu was negative.  Gradually got better.  Wt Readings from Last 3 Encounters:  10/28/17 192 lb (87.1 kg)  08/11/17 190 lb (86.2 kg)  08/08/17 190 lb (86.2 kg)     Past Medical History:  Diagnosis Date  . Anxiety   . CAD (coronary artery disease) 08/2014   Inferior STEMI with PCI/DES to RCA - well preserved LV function  . Colon polyps   . Diabetes ([place])   . DJD (degenerative joint disease)   . Gastric polyps   . History of BPH   . HLD (hyperlipidemia)   . HTN (hypertension)   . Internal hemorrhoids   . Mitral regurgitation   . Osteoarthritis   . Overweight   . Salmonella gastroenteritis     Past Surgical History:  Procedure Laterality Date  . LEFT HEART CATHETERIZATION WITH CORONARY ANGIOGRAM N/A 08/14/2014   Procedure: LEFT HEART CATHETERIZATION WITH CORONARY ANGIOGRAM;  Surgeon: Wellington Hampshire, MD;  Location: [facility];  Service: Cardiovascular;  Laterality: N/A;    Current Outpatient Medications  Medication Sig Dispense Refill  . aspirin EC 81 MG EC tablet Take 1 tablet (81 mg total) by mouth daily.    Marland Kitchen aspirin-sod bicarb-citric acid (ALKA-SELTZER) 325 MG TBEF tablet Take 325 mg by mouth at bedtime.    Marland Kitchen atorvastatin (LIPITOR) 10 MG tablet Take 10 mg by mouth daily at 6 PM.    . carvedilol (COREG) 6.25 MG tablet Take 1 tablet (6.25 mg total) by mouth 2 (two) times daily with a meal. Please make yearly appt with Dr. Marlou Porch for February. 1st attempt 180 tablet 0  . doxepin (SINEQUAN) 10 MG capsule Take 10 mg by mouth at bedtime.    . gabapentin (NEURONTIN) 300 MG capsule Take 300 mg by  mouth 3 (three) times daily. Two tablets in the morning and 3 tablets in the evening.    Marland Kitchen glucose blood ([employer] TEST STRIPS) test strip by Misc.(Non-Drug; Combo Route) route.    Marland Kitchen HYDROcodone-acetaminophen (NORCO) 10-325 MG tablet Take 1 tablet by mouth every 4 (four) hours as needed for moderate pain (Starting at 6pm daily until asleep).     . metFORMIN (GLUCOPHAGE) 500 MG tablet Take 500 mg by mouth 2 (two) times daily.     . nitroGLYCERIN (NITROSTAT) 0.4 MG SL tablet Place 1 tablet (0.4 mg total) under the tongue every 5 (five) minutes x 3 doses as needed for chest pain. 25 tablet 12  . vitamin B-12 (CYANOCOBALAMIN) 1000 MCG tablet Take 1,000 mcg by mouth 2 (two) times daily.      No current facility-administered medications for this visit.     Allergies:    Allergies  Allergen Reactions  . Altace [Ramipril] Cough    Social History:  The patient  reports that he has quit smoking. His smokeless tobacco use includes chew.   Family History  Problem Relation Age of Onset  . Hypertension Mother   . Valvular heart disease Mother   . Hypertension Brother     ROS:  Please see the history of present illness.    All other review of systems negative. PHYSICAL EXAM: VS:  BP 124/82   Pulse 86   Ht 5\' 8"  (1.727 m)   Wt 192 lb (87.1 kg)   SpO2 94%   BMI 29.19 kg/m  GEN: Well nourished, well developed, in no acute distress  HEENT: normal  Neck: no JVD, carotid bruits, or masses Cardiac: RRR; no murmurs, rubs, or gallops,no edema  Respiratory:  clear to auscultation bilaterally, normal work of breathing GI: soft, nontender, nondistended, + BS MS: no deformity or atrophy  Skin: warm and dry, no rash Neuro:  Alert and Oriented x 3, Strength and sensation are intact Psych: euthymic mood, full affect   EKG:  EKG was done today-10/13/16-sinus rhythm 31 old inferior infarct pattern personally viewed 10/13/15-sinus rhythm, PVC, nonspecific ST-T wave changes, deep T-wave inversion noted in  inferior leads, Q waves also noted in inferior leads, old inferior infarct pattern. Unchanged from prior EKG in 2015.  ASSESSMENT AND PLAN:  1. Inferior myocardial infarction December 2015 -RCA stent, drug-eluting. Continue with low-dose aspirin. Try to avoid excessive aspirin products. Doing well. No ANgina.  2.  Mitral regurgitation-repeat echocardiogram looked great.  3.  Diabetes-  He has battled with metformin diarrhea and stomach upset.  He is very careful in counting his carbohydrates.  Overall been doing quite well.  Hemoglobin A1c 6.7. 4.  Hyperlipidemia-LDL goal is 70. ALT previously was 62, now normal.  LDL 95. 5. Palpitations-improved. PACs were noted on  telemetry monitor during hospital. PVC noted on EKG continue with carvedilol.  No changes. 6. 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  Pain when kayaking, sitting down for long periods of time.  This seems to be improving.   One-year follow-up.   Signed, Candee Furbish, MD [facility]  10/28/2017 1:54 PM

## 2017-11-08 ENCOUNTER — Other Ambulatory Visit: Payer: Self-pay | Admitting: Cardiology

## 2018-01-23 ENCOUNTER — Other Ambulatory Visit: Payer: Self-pay | Admitting: *Deleted

## 2018-01-23 ENCOUNTER — Encounter: Payer: Self-pay | Admitting: Cardiology

## 2018-01-23 ENCOUNTER — Telehealth: Payer: Self-pay | Admitting: Cardiology

## 2018-01-23 MED ORDER — NITROGLYCERIN 0.4 MG SL SUBL: 0.4000 mg | SUBLINGUAL_TABLET | SUBLINGUAL | 5 refills | Status: DC | PRN

## 2018-01-23 NOTE — Telephone Encounter (Signed)
NEW MESSAGE     *STAT* If patient is at the pharmacy, call can be transferred to refill team.   1. Which medications need to be refilled? (please list name of each medication and dose if known) nitroGLYCERIN (NITROSTAT) 0.4 MG SL tablet  2. Which pharmacy/location (including street and city if local pharmacy) is medication to be sent to?Place 1 tablet (0.4 mg total) under the tongue every 5 (five) minutes x 3 doses as needed for chest pain.  3. Do they need a 30 day or 90 day supply? [place]

## 2018-11-23 ENCOUNTER — Telehealth: Payer: Self-pay | Admitting: Cardiology

## 2018-11-23 NOTE — Telephone Encounter (Signed)
Attempted to contact pt for COVID screening.  Pt has appt with Dr. Marlou Porch on 11/30/2018.  Left message to call back.

## 2018-11-23 NOTE — Telephone Encounter (Signed)
Pt called back and rescheduled appt to May

## 2018-11-30 ENCOUNTER — Ambulatory Visit: Payer: Medicare Other | Admitting: Cardiology

## 2018-12-11 IMAGING — DX DG CHEST 2V
2 series · 2 of 2 positions shown · non-contrast
Comparison: 11/09/2015

CLINICAL DATA: Left shoulder pain, left-sided chest pain

EXAM:
CHEST  2 VIEW

[chest lat]
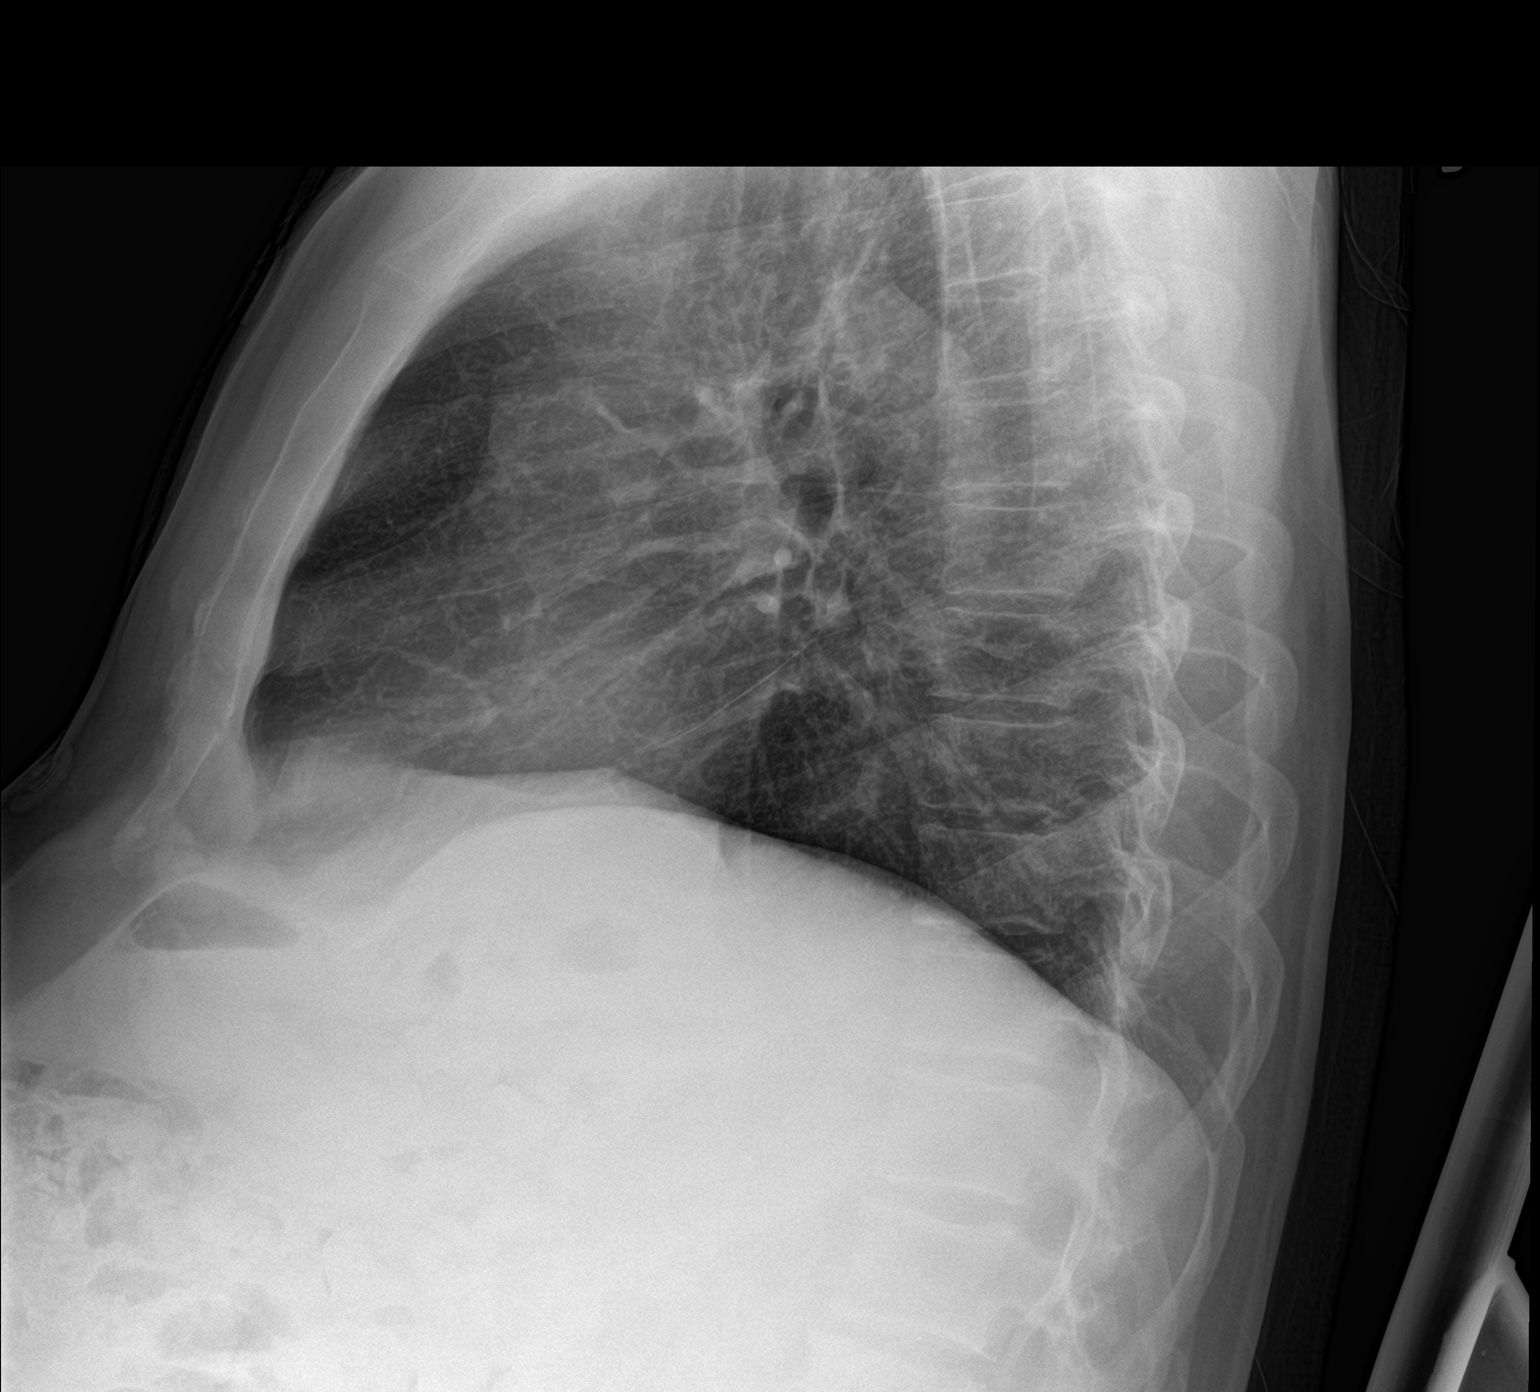

[chest ap]
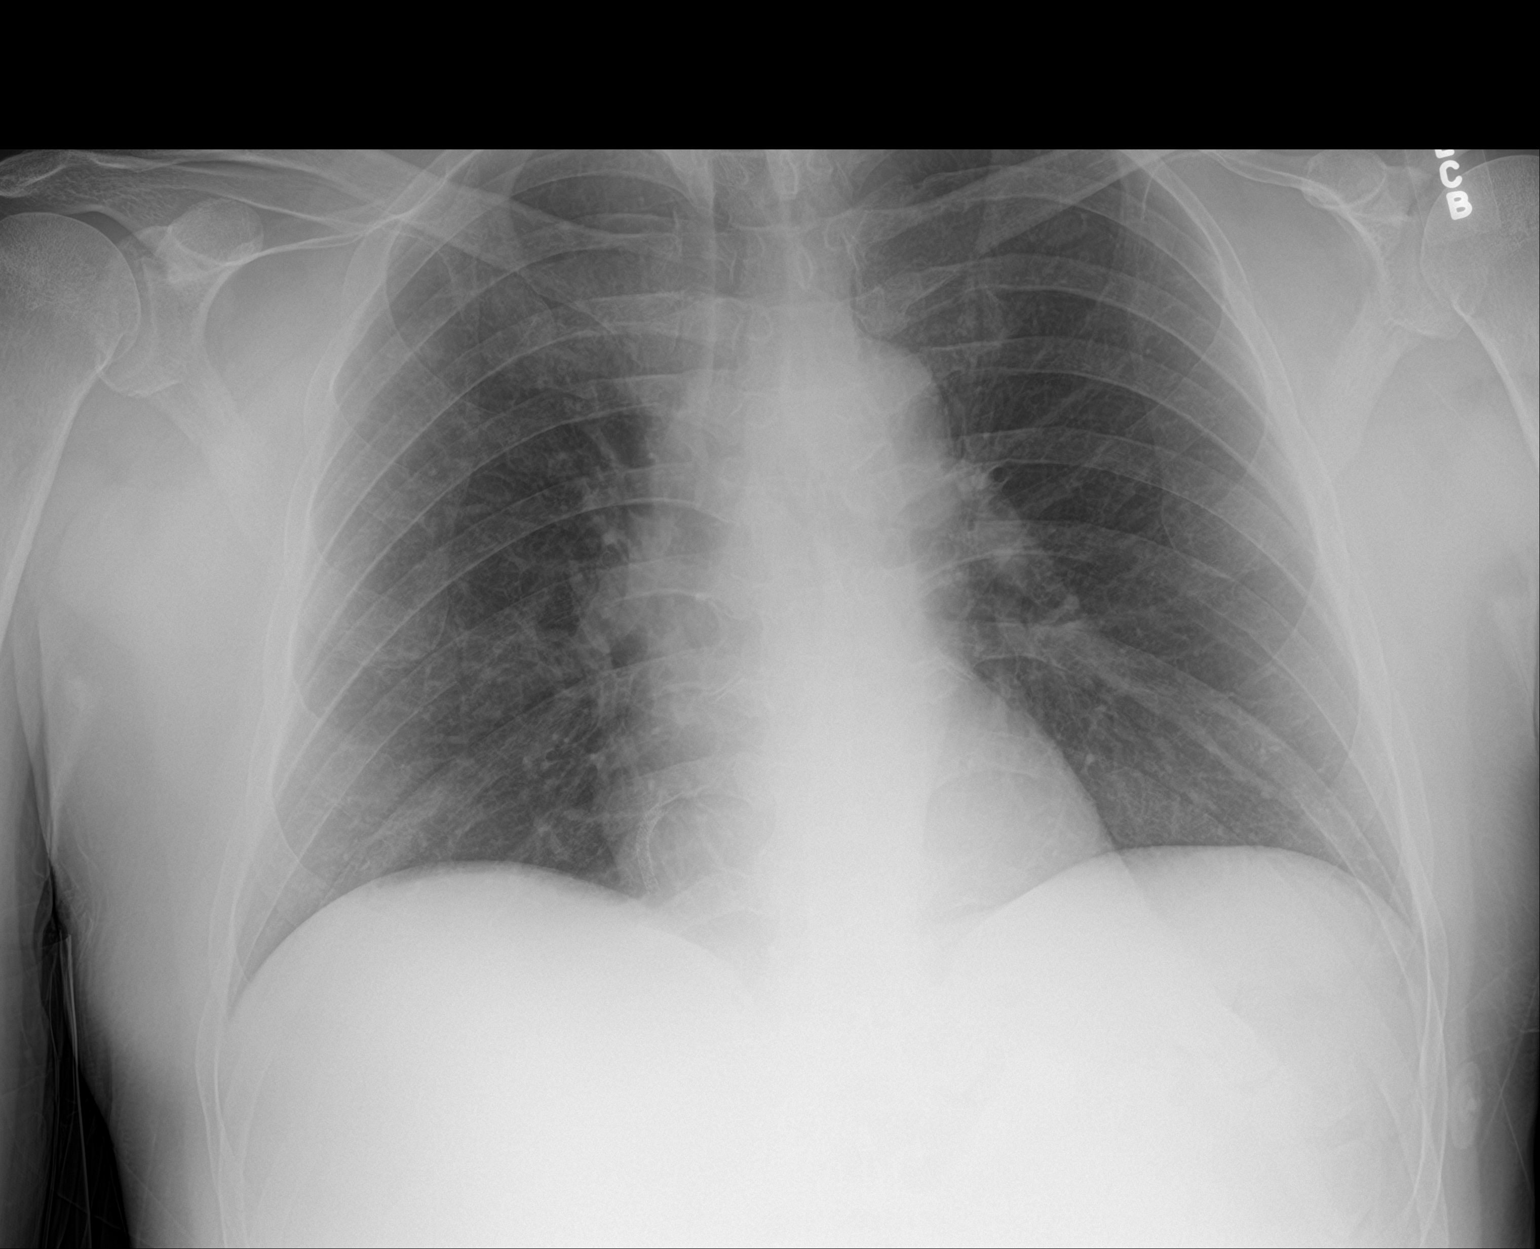

[2 of 2 positions shown; findings below may reference images not displayed]

FINDINGS: The heart size and mediastinal contours are within normal limits.
Both lungs are clear. The visualized skeletal structures are
unremarkable.
IMPRESSION: No active cardiopulmonary disease.

## 2018-12-25 ENCOUNTER — Other Ambulatory Visit: Payer: Self-pay | Admitting: Cardiology

## 2019-01-01 ENCOUNTER — Telehealth: Payer: Self-pay | Admitting: *Deleted

## 2019-01-01 NOTE — Telephone Encounter (Signed)
Lm for pt to cb to discuss virtual vs phone appt options.

## 2019-01-02 NOTE — Telephone Encounter (Signed)
Lm another vm for pt to c/b to discuss virtual vs phone appt scheduled for 5/6

## 2019-01-03 NOTE — Telephone Encounter (Signed)
According to documentation in appointment notes by Algie Coffer pt is agreeable with virtual appt.  Sent information r/t virtual appt and consent thru MyChart to pt.

## 2019-01-10 ENCOUNTER — Encounter: Payer: Self-pay | Admitting: Cardiology

## 2019-01-10 ENCOUNTER — Telehealth (INDEPENDENT_AMBULATORY_CARE_PROVIDER_SITE_OTHER): Payer: Medicare Other | Admitting: Cardiology

## 2019-01-10 VITALS — BP 125/85 | HR 82 | Ht 68.0 in | Wt 182.0 lb

## 2019-01-10 DIAGNOSIS — I2510 Atherosclerotic heart disease of native coronary artery without angina pectoris: Secondary | ICD-10-CM

## 2019-01-10 DIAGNOSIS — I2583 Coronary atherosclerosis due to lipid rich plaque: Secondary | ICD-10-CM | POA: Diagnosis not present

## 2019-01-10 NOTE — Patient Instructions (Addendum)
  Medication Instructions:  The current medical regimen is effective;  continue present plan and medications.  If you need a refill on your cardiac medications before your next appointment, please call your pharmacy.   Follow-Up: Follow up in 1 year with Dr. Skains.  You will receive a letter in the mail 2 months before you are due.  Please call us when you receive this letter to schedule your follow up appointment.  Thank you for choosing [facility]!!     

## 2019-01-10 NOTE — Progress Notes (Signed)
Virtual Visit via Video Note   This visit type was conducted due to national recommendations for restrictions regarding the COVID-19 Pandemic (e.g. social distancing) in an effort to limit this patient's exposure and mitigate transmission in our community.  Due to his co-morbid illnesses, this patient is at least at moderate risk for complications without adequate follow up.  This format is felt to be most appropriate for this patient at this time.  All issues noted in this document were discussed and addressed.  A limited physical exam was performed with this format.  Please refer to the patient's chart for his consent to telehealth for [facility].   Date:  01/10/2019   ID:  Karl Alvarez, DOB [birth year], MRN [id number]  Patient Location: Home Provider Location: Home  PCP:  Raina Mina., MD  Cardiologist:  Candee Furbish, MD  Electrophysiologist:  None   Evaluation Performed:  Follow-Up Visit  Chief Complaint: Coronary artery disease follow-up  History of Present Illness:    Karl Alvarez is a 63 y.o. male with coronary artery disease status post inferior MI RCA stent December 2015 here for follow-up.  Surrounding his MI he had moderate to severe mitral regurgitation.  Since he was worried about irregular heartbeats, elevated blood pressure at times.  Had an episode of shingles.  States that this was worse than his heart attack.  Had PNA 7 times, a result of where he works.   Overall he has been doing quite well but a little bit nervous about coronavirus but he has been maintaining social distancing and being safe.  He avoided a funeral with a house with 30 people and it 2 weeks ago.  The patient does not have symptoms concerning for COVID-19 infection (fever, chills, cough, or new shortness of breath).    Past Medical History:  Diagnosis Date  . Anxiety   . CAD (coronary artery disease) 08/2014   Inferior STEMI with PCI/DES to RCA - well preserved LV function  .  Colon polyps   . Diabetes ([place])   . DJD (degenerative joint disease)   . Gastric polyps   . History of BPH   . HLD (hyperlipidemia)   . HTN (hypertension)   . Internal hemorrhoids   . Mitral regurgitation   . Osteoarthritis   . Overweight   . Salmonella gastroenteritis    Past Surgical History:  Procedure Laterality Date  . LEFT HEART CATHETERIZATION WITH CORONARY ANGIOGRAM N/A 08/14/2014   Procedure: LEFT HEART CATHETERIZATION WITH CORONARY ANGIOGRAM;  Surgeon: Wellington Hampshire, MD;  Location: [facility];  Service: Cardiovascular;  Laterality: N/A;     Current Meds  Medication Sig  . aspirin-sod bicarb-citric acid (ALKA-SELTZER) 325 MG TBEF tablet Take 325 mg by mouth at bedtime.  Marland Kitchen atorvastatin (LIPITOR) 10 MG tablet Take 10 mg by mouth daily at 6 PM.  . carvedilol (COREG) 6.25 MG tablet TAKE 1 TABLET BY MOUTH TWO  TIMES DAILY WITH A MEAL  . gabapentin (NEURONTIN) 300 MG capsule Take 300 mg by mouth 3 (three) times daily. Two tablets in the morning and 3 tablets in the evening.  Marland Kitchen glucose blood ([employer] TEST STRIPS) test strip by Misc.(Non-Drug; Combo Route) route.  Marland Kitchen HYDROcodone-acetaminophen (NORCO) 10-325 MG tablet Take 1 tablet by mouth every 4 (four) hours as needed for moderate pain (Starting at 6pm daily until asleep).   . metFORMIN (GLUCOPHAGE) 500 MG tablet Take 500 mg by mouth 2 (two) times daily.   . nitroGLYCERIN (NITROSTAT) 0.4  MG SL tablet Place 1 tablet (0.4 mg total) under the tongue every 5 (five) minutes x 3 doses as needed for chest pain.  Marland Kitchen tiZANidine (ZANAFLEX) 4 MG tablet Take 4 mg by mouth at bedtime.  . vitamin B-12 (CYANOCOBALAMIN) 1000 MCG tablet Take 1,000 mcg by mouth 2 (two) times daily.      Allergies:   Altace [ramipril]   Social History   Tobacco Use  . Smoking status: Former Research scientist (life sciences)  . Smokeless tobacco: Current User    Types: Chew  Substance Use Topics  . Alcohol use: Not on file  . Drug use: Not on file     Family Hx: The patient's  family history includes Hypertension in his brother and mother; Valvular heart disease in his mother.  ROS:   Please see the history of present illness.    Chronic pain.  No chest pain, no shortness of breath no fevers no chills.  No syncope. All other systems reviewed and are negative.   Prior CV studies:   The following studies were reviewed today:  Echocardiogram: 2016  - Left ventricle: LVEF is approximately 55% with basal inferior akinesis. The cavity size was normal. Wall thickness was increased in a pattern of mild LVH. Doppler parameters are consistent with abnormal left ventricular relaxation (grade 1 diastolic dysfunction). - Mitral valve: There was mild regurgitation. - Left atrium: The atrium was mildly dilated.  Impressions:  - Since previous echo, MR is less severe.  Labs/Other Tests and Data Reviewed:    EKG:  An ECG dated 09/09/17 was personally reviewed today and demonstrated:  Sinus rhythm no other abnormalities  Recent Labs: No results found for requested labs within last 8760 hours.   Recent Lipid Panel Lab Results  Component Value Date/Time   CHOL 165 08/15/2014 12:19 AM   TRIG 198 (H) 08/15/2014 12:19 AM   HDL 30 (L) 08/15/2014 12:19 AM   CHOLHDL 5.5 08/15/2014 12:19 AM   LDLCALC 95 08/15/2014 12:19 AM    Wt Readings from Last 3 Encounters:  01/10/19 182 lb (82.6 kg)  10/28/17 192 lb (87.1 kg)  08/11/17 190 lb (86.2 kg)     Objective:    Vital Signs:  BP 125/85   Pulse 82   Ht 5\' 8"  (1.727 m)   Wt 182 lb (82.6 kg)   BMI 27.67 kg/m    VITAL SIGNS:  reviewed GEN:  no acute distress EYES:  sclerae anicteric, EOMI - Extraocular Movements Intact RESPIRATORY:  normal respiratory effort, symmetric expansion SKIN:  no rash, lesions or ulcers. MUSCULOSKELETAL:  no obvious deformities. NEURO:  alert and oriented x 3, no obvious focal deficit PSYCH:  normal affect  ASSESSMENT & PLAN:    Coronary artery disease status post inferior  MI RCA stent placed December 2015.  DES.  Low-dose aspirin.  No anginal symptoms doing well.  Had transient mitral regurgitation, repeat echocardiogram looked excellent.  Diabetes with hypertension - Very careful with his carbohydrates.  Previously had diarrhea with metformin, now improved.  A1c 6.7. No insulin.   Hyperlipidemia - LDL less than 70 goal.  Currently 89.  had a transient uptake in ALT to 62 at one point, now normal most recently. - Dr. Toma Deiters has performed some lab work on him recently, I am having some trouble through care everywhere getting information.   COVID-19 Education: The signs and symptoms of COVID-19 were discussed with the patient and how to seek care for testing (follow up with PCP or arrange E-visit).  The importance of social distancing was discussed today.  Time:   Today, I have spent 15 minutes with the patient with telehealth technology discussing the above problems.     Medication Adjustments/Labs and Tests Ordered: Current medicines are reviewed at length with the patient today.  Concerns regarding medicines are outlined above.   Tests Ordered: No orders of the defined types were placed in this encounter.   Medication Changes: No orders of the defined types were placed in this encounter.   Disposition:  Follow up in 1 year(s)  Signed, Candee Furbish, MD  01/10/2019 3:20 PM    [place]

## 2019-05-30 ENCOUNTER — Telehealth: Payer: Self-pay | Admitting: *Deleted

## 2019-05-30 NOTE — Telephone Encounter (Signed)
error 

## 2019-06-25 ENCOUNTER — Other Ambulatory Visit: Payer: Self-pay | Admitting: Cardiology

## 2019-12-20 ENCOUNTER — Other Ambulatory Visit: Payer: Self-pay

## 2019-12-20 ENCOUNTER — Ambulatory Visit: Payer: Medicare Other | Admitting: Cardiology

## 2019-12-20 VITALS — BP 152/96 | HR 86 | Ht 69.0 in | Wt 198.0 lb

## 2019-12-20 DIAGNOSIS — I2583 Coronary atherosclerosis due to lipid rich plaque: Secondary | ICD-10-CM

## 2019-12-20 DIAGNOSIS — I252 Old myocardial infarction: Secondary | ICD-10-CM

## 2019-12-20 DIAGNOSIS — E782 Mixed hyperlipidemia: Secondary | ICD-10-CM

## 2019-12-20 DIAGNOSIS — I10 Essential (primary) hypertension: Secondary | ICD-10-CM | POA: Diagnosis not present

## 2019-12-20 DIAGNOSIS — I2510 Atherosclerotic heart disease of native coronary artery without angina pectoris: Secondary | ICD-10-CM

## 2019-12-20 MED ORDER — CARVEDILOL 12.5 MG PO TABS: 12.5000 mg | ORAL_TABLET | Freq: Two times a day (BID) | ORAL | 3 refills | Status: DC

## 2019-12-20 MED ORDER — HYDROCHLOROTHIAZIDE 25 MG PO TABS: 25.0000 mg | ORAL_TABLET | Freq: Every day | ORAL | 3 refills | Status: DC

## 2019-12-20 MED ORDER — ROSUVASTATIN CALCIUM 20 MG PO TABS: 20.0000 mg | ORAL_TABLET | Freq: Every day | ORAL | 3 refills | Status: DC

## 2019-12-20 NOTE — Progress Notes (Signed)
Cardiology Office Note:    Date:  12/20/2019   ID:  Karl Alvarez, DOB 01-24-1956, MRN [id number]  PCP:  Raina Mina., MD  Cardiologist:  Candee Furbish, MD  Electrophysiologist:  None   Referring MD: Raina Mina., MD     History of Present Illness:    Karl Alvarez is a 64 y.o. male here for follow-up of difficult to control hypertension.  Coronary disease post MI RCA stent December 2015.  Broke tibia in 08/2019. Pain. HTN worse.   After first Covid shot blood pressure seem to elevate.  Not having any chest pain no shortness of breath no syncope.  Past Medical History:  Diagnosis Date  . Anxiety   . CAD (coronary artery disease) 08/2014   Inferior STEMI with PCI/DES to RCA - well preserved LV function  . Colon polyps   . Diabetes ([place])   . DJD (degenerative joint disease)   . Gastric polyps   . History of BPH   . HLD (hyperlipidemia)   . HTN (hypertension)   . Internal hemorrhoids   . Mitral regurgitation   . Osteoarthritis   . Overweight   . Salmonella gastroenteritis     Past Surgical History:  Procedure Laterality Date  . LEFT HEART CATHETERIZATION WITH CORONARY ANGIOGRAM N/A 08/14/2014   Procedure: LEFT HEART CATHETERIZATION WITH CORONARY ANGIOGRAM;  Surgeon: Wellington Hampshire, MD;  Location: [facility];  Service: Cardiovascular;  Laterality: N/A;    Current Medications: Current Meds  Medication Sig  . aspirin-sod bicarb-citric acid (ALKA-SELTZER) 325 MG TBEF tablet Take 325 mg by mouth at bedtime.  . gabapentin (NEURONTIN) 300 MG capsule Take 300 mg by mouth 3 (three) times daily. Two tablets in the morning and 3 tablets in the evening.  Marland Kitchen glucose blood ([employer] TEST STRIPS) test strip by Misc.(Non-Drug; Combo Route) route.  Marland Kitchen HYDROcodone-acetaminophen (NORCO) 10-325 MG tablet Take 1 tablet by mouth every 4 (four) hours as needed for moderate pain (Starting at 6pm daily until asleep).   . losartan (COZAAR) 100 MG tablet Take 100 mg by mouth  daily.  . metFORMIN (GLUCOPHAGE) 500 MG tablet Take 500 mg by mouth 2 (two) times daily.   . nitroGLYCERIN (NITROSTAT) 0.4 MG SL tablet Place 1 tablet (0.4 mg total) under the tongue every 5 (five) minutes x 3 doses as needed for chest pain.  Marland Kitchen tiZANidine (ZANAFLEX) 4 MG tablet Take 4 mg by mouth at bedtime.  . vitamin B-12 (CYANOCOBALAMIN) 1000 MCG tablet Take 1,000 mcg by mouth 2 (two) times daily.   . [DISCONTINUED] atorvastatin (LIPITOR) 10 MG tablet Take 10 mg by mouth daily at 6 PM.  . [DISCONTINUED] carvedilol (COREG) 6.25 MG tablet TAKE 1 TABLET BY MOUTH TWO  TIMES DAILY WITH A MEAL  . [DISCONTINUED] hydrochlorothiazide (MICROZIDE) 12.5 MG capsule Take 12.5 mg by mouth daily.     Allergies:   Altace [ramipril]   Social History   Socioeconomic History  . Marital status: Married    Spouse name: Not on file  . Number of children: Not on file  . Years of education: Not on file  . Highest education level: Not on file  Occupational History  . Not on file  Tobacco Use  . Smoking status: Former Research scientist (life sciences)  . Smokeless tobacco: Current User    Types: Chew  Substance and Sexual Activity  . Alcohol use: Not on file  . Drug use: Not on file  . Sexual activity: Not on file  Other Topics  Concern  . Not on file  Social History Narrative  . Not on file   Social Determinants of Health   Financial Resource Strain:   . Difficulty of Paying Living Expenses:   Food Insecurity:   . Worried About Charity fundraiser in the Last Year:   . Arboriculturist in the Last Year:   Transportation Needs:   . Film/video editor (Medical):   Marland Kitchen Lack of Transportation (Non-Medical):   Physical Activity:   . Days of Exercise per Week:   . Minutes of Exercise per Session:   Stress:   . Feeling of Stress :   Social Connections:   . Frequency of Communication with Friends and Family:   . Frequency of Social Gatherings with Friends and Family:   . Attends Religious Services:   . Active Member of  Clubs or Organizations:   . Attends Archivist Meetings:   Marland Kitchen Marital Status:      Family History: The patient's family history includes Hypertension in his brother and mother; Valvular heart disease in his mother.  ROS:   Please see the history of present illness.     All other systems reviewed and are negative.  EKGs/Labs/Other Studies Reviewed:      EKG:  EKG is  ordered today.  The ekg ordered today demonstrates sinus rhythm 86 small Q waves noted inferiorly  Recent Labs: No results found for requested labs within last 8760 hours.  Recent Lipid Panel    Component Value Date/Time   CHOL 165 08/15/2014 0019   TRIG 198 (H) 08/15/2014 0019   HDL 30 (L) 08/15/2014 0019   CHOLHDL 5.5 08/15/2014 0019   VLDL 40 08/15/2014 0019   LDLCALC 95 08/15/2014 0019    Physical Exam:    VS:  BP (!) 152/96   Pulse 86   Ht 5\' 9"  (1.753 m)   Wt 198 lb (89.8 kg)   BMI 29.24 kg/m     Wt Readings from Last 3 Encounters:  12/20/19 198 lb (89.8 kg)  01/10/19 182 lb (82.6 kg)  10/28/17 192 lb (87.1 kg)     GEN:  Well nourished, well developed in no acute distress HEENT: Normal NECK: No JVD; No carotid bruits LYMPHATICS: No lymphadenopathy CARDIAC: RRR, no murmurs, rubs, gallops RESPIRATORY:  Clear to auscultation without rales, wheezing or rhonchi  ABDOMEN: Soft, non-tender, non-distended MUSCULOSKELETAL:  No edema; No deformity  SKIN: Warm and dry NEUROLOGIC:  Alert and oriented x 3 PSYCHIATRIC:  Normal affect   ASSESSMENT:    1. Coronary artery disease due to lipid rich plaque   2. Old MI (myocardial infarction)   3. Essential hypertension   4. Mixed hyperlipidemia    PLAN:    In order of problems listed above:  HTN difficult to control  - Losrtan 100  - HCTZ 12.5 QD --> 25mg   - Coreg 6.25 BID--> 12.5 BID  -Hypertension clinic follow-up in 2 weeks  Hyperlipidemia -LDL goal less than 70, most recently 101. -Lets go ahead and stop his atorvastatin 10 mg  and put him on high intensity dose Crestor 20 mg a day.  [facility]. -Dr. Bea Graff has been checking his lipids.  Note, he may have had some trouble with atorvastatin 80 from a symptom standpoint.  CAD post MI -Try to optimize his blood pressure and lipid management.    Medication Adjustments/Labs and Tests Ordered: Current medicines are reviewed at length with the patient today.  Concerns regarding medicines  are outlined above.  Orders Placed This Encounter  Procedures  . EKG 12-Lead   Meds ordered this encounter  Medications  . carvedilol (COREG) 12.5 MG tablet    Sig: Take 1 tablet (12.5 mg total) by mouth 2 (two) times daily.    Dispense:  180 tablet    Refill:  3    Please d/c any other rx for Carvedilol.  This is a dose increase  . rosuvastatin (CRESTOR) 20 MG tablet    Sig: Take 1 tablet (20 mg total) by mouth daily.    Dispense:  90 tablet    Refill:  3  . hydrochlorothiazide (HYDRODIURIL) 25 MG tablet    Sig: Take 1 tablet (25 mg total) by mouth daily.    Dispense:  90 tablet    Refill:  3    Please d/c any other orders for HCTZ - this is a dose increase    Patient Instructions  Medication Instructions:  Please discontinue your Atorvastatin.   Start Crestor 20 mg once a day.   Increase Hydrochlorothiazide to 25 mg a day.   Increase Carvedilol to 12.5 mg twice a day.   Continue all other medications as listed.  *If you need a refill on your cardiac medications before your next appointment, please call your pharmacy*  You have been referred to the Hypertension Clinic.  Dr Marlou Porch would like for you to be seen in 2 weeks.  Follow-Up: At [facility], you and your health needs are our priority.  As part of our continuing mission to provide you with exceptional heart care, we have created designated Provider Care Teams.  These Care Teams include your primary Cardiologist (physician) and Advanced Practice Providers (APPs -  Physician Assistants and Nurse Practitioners)  who all work together to provide you with the care you need, when you need it.  We recommend signing up for the patient portal called "MyChart".  Sign up information is provided on this After Visit Summary.  MyChart is used to connect with patients for Virtual Visits (Telemedicine).  Patients are able to view lab/test results, encounter notes, upcoming appointments, etc.  Non-urgent messages can be sent to your provider as well.   To learn more about what you can do with MyChart, go to [email or web address].    Thank you for choosing [facility]!!         Signed, Candee Furbish, MD  12/20/2019 2:48 PM    [place]

## 2019-12-20 NOTE — Patient Instructions (Signed)
Medication Instructions:  Please discontinue your Atorvastatin.   Start Crestor 20 mg once a day.   Increase Hydrochlorothiazide to 25 mg a day.   Increase Carvedilol to 12.5 mg twice a day.   Continue all other medications as listed.  *If you need a refill on your cardiac medications before your next appointment, please call your pharmacy*  You have been referred to the Hypertension Clinic.  Dr Marlou Porch would like for you to be seen in 2 weeks.  Follow-Up: At [facility], you and your health needs are our priority.  As part of our continuing mission to provide you with exceptional heart care, we have created designated Provider Care Teams.  These Care Teams include your primary Cardiologist (physician) and Advanced Practice Providers (APPs -  Physician Assistants and Nurse Practitioners) who all work together to provide you with the care you need, when you need it.  We recommend signing up for the patient portal called "MyChart".  Sign up information is provided on this After Visit Summary.  MyChart is used to connect with patients for Virtual Visits (Telemedicine).  Patients are able to view lab/test results, encounter notes, upcoming appointments, etc.  Non-urgent messages can be sent to your provider as well.   To learn more about what you can do with MyChart, go to [email or web address].    Thank you for choosing [facility]!!

## 2019-12-25 ENCOUNTER — Other Ambulatory Visit: Payer: Self-pay

## 2019-12-25 ENCOUNTER — Encounter: Payer: Self-pay | Admitting: Cardiovascular Disease

## 2019-12-25 ENCOUNTER — Ambulatory Visit: Payer: Medicare Other | Admitting: Cardiovascular Disease

## 2019-12-25 VITALS — BP 132/84 | HR 91 | Ht 69.0 in | Wt 193.0 lb

## 2019-12-25 DIAGNOSIS — I10 Essential (primary) hypertension: Secondary | ICD-10-CM

## 2019-12-25 DIAGNOSIS — Z79899 Other long term (current) drug therapy: Secondary | ICD-10-CM

## 2019-12-25 MED ORDER — CLONIDINE 0.1 MG/24HR TD PTWK: 0.1000 mg | MEDICATED_PATCH | TRANSDERMAL | 12 refills | Status: DC

## 2019-12-25 NOTE — Patient Instructions (Addendum)
Medication Instructions:  START CLONIDINE 0.1 MG PATCH WEEKLY    Labwork: NONE   Testing/Procedures: Your physician has requested that you have a renal artery duplex. During this test, an ultrasound is used to evaluate blood flow to the kidneys. Allow one hour for this exam. Do not eat after midnight the day before and avoid carbonated beverages. Take your medications as you usually do.   Follow-Up: 6 WEEKS WITH PHARM D 02/05/2020 AT 2:00 PM   Your physician recommends that you schedule a follow-up appointment in: 4 MONTHS WITH DR Bernice, THE OFFICE WILL CALL YOU WITH APPOINTMENT    Special Instructions:   MONITOR YOUR BLOOD PRESSURE TWICE A DAY AND LOG IN THE VIVIFY APP YOU DOWNLOADED   DASH Eating Plan DASH stands for "Dietary Approaches to Stop Hypertension." The DASH eating plan is a healthy eating plan that has been shown to reduce high blood pressure (hypertension). It may also reduce your risk for type 2 diabetes, heart disease, and stroke. The DASH eating plan may also help with weight loss. What are tips for following this plan?  General guidelines  Avoid eating more than 2,300 mg (milligrams) of salt (sodium) a day. If you have hypertension, you may need to reduce your sodium intake to 1,500 mg a day.  Limit alcohol intake to no more than 1 drink a day for nonpregnant women and 2 drinks a day for men. One drink equals 12 oz of beer, 5 oz of wine, or 1 oz of hard liquor.  Work with your health care provider to maintain a healthy body weight or to lose weight. Ask what an ideal weight is for you.  Get at least 30 minutes of exercise that causes your heart to beat faster (aerobic exercise) most days of the week. Activities may include walking, swimming, or biking.  Work with your health care provider or diet and nutrition specialist (dietitian) to adjust your eating plan to your individual calorie needs. Reading food labels   Check food labels for the amount of sodium per  serving. Choose foods with less than 5 percent of the Daily Value of sodium. Generally, foods with less than 300 mg of sodium per serving fit into this eating plan.  To find whole grains, look for the word "whole" as the first word in the ingredient list. Shopping  Buy products labeled as "low-sodium" or "no salt added."  Buy fresh foods. Avoid canned foods and premade or frozen meals. Cooking  Avoid adding salt when cooking. Use salt-free seasonings or herbs instead of table salt or sea salt. Check with your health care provider or pharmacist before using salt substitutes.  Do not fry foods. Cook foods using healthy methods such as baking, boiling, grilling, and broiling instead.  Cook with heart-healthy oils, such as olive, canola, soybean, or sunflower oil. Meal planning  Eat a balanced diet that includes: ? 5 or more servings of fruits and vegetables each day. At each meal, try to fill half of your plate with fruits and vegetables. ? Up to 6-8 servings of whole grains each day. ? Less than 6 oz of lean meat, poultry, or fish each day. A 3-oz serving of meat is about the same size as a deck of cards. One egg equals 1 oz. ? 2 servings of low-fat dairy each day. ? A serving of nuts, seeds, or beans 5 times each week. ? Heart-healthy fats. Healthy fats called Omega-3 fatty acids are found in foods such as flaxseeds and coldwater  fish, like sardines, salmon, and mackerel.  Limit how much you eat of the following: ? Canned or prepackaged foods. ? Food that is high in trans fat, such as fried foods. ? Food that is high in saturated fat, such as fatty meat. ? Sweets, desserts, sugary drinks, and other foods with added sugar. ? Full-fat dairy products.  Do not salt foods before eating.  Try to eat at least 2 vegetarian meals each week.  Eat more home-cooked food and less restaurant, buffet, and fast food.  When eating at a restaurant, ask that your food be prepared with less salt or  no salt, if possible. What foods are recommended? The items listed may not be a complete list. Talk with your dietitian about what dietary choices are best for you. Grains Whole-grain or whole-wheat bread. Whole-grain or whole-wheat pasta. Brown rice. [place] Morrow. Bulgur. Whole-grain and low-sodium cereals. Pita bread. Low-fat, low-sodium crackers. Whole-wheat flour tortillas. Vegetables Fresh or frozen vegetables (raw, steamed, roasted, or grilled). Low-sodium or reduced-sodium tomato and vegetable juice. Low-sodium or reduced-sodium tomato sauce and tomato paste. Low-sodium or reduced-sodium canned vegetables. Fruits All fresh, dried, or frozen fruit. Canned fruit in natural juice (without added sugar). Meat and other protein foods Skinless chicken or [place]. Ground chicken or [place]. Pork with fat trimmed off. Fish and seafood. Egg whites. Dried beans, peas, or lentils. Unsalted nuts, nut butters, and seeds. Unsalted canned beans. Lean cuts of beef with fat trimmed off. Low-sodium, lean deli meat. Dairy Low-fat (1%) or fat-free (skim) milk. Fat-free, low-fat, or reduced-fat cheeses. Nonfat, low-sodium ricotta or cottage cheese. Low-fat or nonfat yogurt. Low-fat, low-sodium cheese. Fats and oils Soft margarine without trans fats. Vegetable oil. Low-fat, reduced-fat, or light mayonnaise and salad dressings (reduced-sodium). Canola, safflower, olive, soybean, and sunflower oils. Avocado. Seasoning and other foods Herbs. Spices. Seasoning mixes without salt. Unsalted popcorn and pretzels. Fat-free sweets. What foods are not recommended? The items listed may not be a complete list. Talk with your dietitian about what dietary choices are best for you. Grains Baked goods made with fat, such as croissants, muffins, or some breads. Dry pasta or rice meal packs. Vegetables Creamed or fried vegetables. Vegetables in a cheese sauce. Regular canned vegetables (not low-sodium or reduced-sodium).  Regular canned tomato sauce and paste (not low-sodium or reduced-sodium). Regular tomato and vegetable juice (not low-sodium or reduced-sodium). Angie Fava. Olives. Fruits Canned fruit in a light or heavy syrup. Fried fruit. Fruit in cream or butter sauce. Meat and other protein foods Fatty cuts of meat. Ribs. Fried meat. Berniece Salines. Sausage. Bologna and other processed lunch meats. Salami. Fatback. Hotdogs. Bratwurst. Salted nuts and seeds. Canned beans with added salt. Canned or smoked fish. Whole eggs or egg yolks. Chicken or [place] with skin. Dairy Whole or 2% milk, cream, and half-and-half. Whole or full-fat cream cheese. Whole-fat or sweetened yogurt. Full-fat cheese. Nondairy creamers. Whipped toppings. Processed cheese and cheese spreads. Fats and oils Butter. Stick margarine. Lard. Shortening. Ghee. Bacon fat. Tropical oils, such as coconut, palm kernel, or palm oil. Seasoning and other foods Salted popcorn and pretzels. Onion salt, garlic salt, seasoned salt, table salt, and sea salt. Worcestershire sauce. Tartar sauce. Barbecue sauce. Teriyaki sauce. Soy sauce, including reduced-sodium. Steak sauce. Canned and packaged gravies. Fish sauce. Oyster sauce. Cocktail sauce. Horseradish that you find on the shelf. Ketchup. Mustard. Meat flavorings and tenderizers. Bouillon cubes. Hot sauce and Tabasco sauce. Premade or packaged marinades. Premade or packaged taco seasonings. Relishes. Regular salad dressings. Where to find more  information:  National Heart, Lung, and Blood Institute: https://wilson-eaton.com/  American Heart Association: www.heart.org Summary  The DASH eating plan is a healthy eating plan that has been shown to reduce high blood pressure (hypertension). It may also reduce your risk for type 2 diabetes, heart disease, and stroke.  With the DASH eating plan, you should limit salt (sodium) intake to 2,300 mg a day. If you have hypertension, you may need to reduce your sodium intake to 1,500 mg  a day.  When on the DASH eating plan, aim to eat more fresh fruits and vegetables, whole grains, lean proteins, low-fat dairy, and heart-healthy fats.  Work with your health care provider or diet and nutrition specialist (dietitian) to adjust your eating plan to your individual calorie needs. This information is not intended to replace advice given to you by your health care provider. Make sure you discuss any questions you have with your health care provider. Document Released: 08/12/2011 Document Revised: 08/05/2017 Document Reviewed: 08/16/2016 Elsevier Patient Education  2020 [employer].

## 2019-12-25 NOTE — Progress Notes (Signed)
Hypertension Clinic Initial Assessment:    Date:  01/13/2020   ID:  JOSHA DENOBLE, DOB [birth year], MRN [id number]  PCP:  Raina Mina., MD  Cardiologist:  Candee Furbish, MD   Referring MD: Raina Mina., MD   CC: Hypertension  History of Present Illness:    DORUK CHADWICK is a 64 y.o. male with a hx of CA s/p inferior STEMI (DES to RCA in 2015), hypertension, hyperlipidemia, DM, and chronic pain here to establish care in the hypertension clinic. He notes that his BP has been more difficult to control lately.  He has been working with his PCP and Dr. Marlou Porch.  Carvedilol was increased to 12.5mg  and HCTZ was increased to 25mg .  Since then his BP has been well-controlled.  After eating his BP goes up as high as the 200s.  His BP seems to better a couple hours after eating.  When his BP increases he has feel it pounding his neck.  Dr. Marlou Porch increased HCTZ to 25 on 4/15.  Carvedilol was also recently doubled.    Lately he has struggled with constipation and abdominal pain due to pain medications.  He takes it chronically for decades. He has been trying to reduce the medication and struggles with chronic pain in his back and leg.  He broke his leg 08/2019.  He broke it by twisting his ankle and tripping over a door mat.  There are no preceding cardiac symptoms.  He started taking calcium and vitamin D due to concern for possible osteoporosis.  He switched from hydrocodone to oxycodone.  He did physical therapy for four visits but didn't feel like it was helpful. He noticed that when he took his muscle relaxer his BP was easier to control and it was easier for him to sleep.  This was discontinued on 3/26.  He doesn't get much exercise but works out in his yard.  He denies exertional chest pain but has some shortness of breath.  He notes that his diet isn't as good as it used to.  He takes a lot of vitamins and supplements (calcium, Centrum multivitamin, B12, and iron) because he doesn't eat  regular meals.  He struggles with chronic constipation and abdominal pain.  He snores occasionally and typically sleeps less than 4 hours/day.  He is unsure about apnea and does not feel rested when he wakes up.  He reports feeling lightheaded when he squats and when he stands.  Since increasing the carvedilol he notes that this has been worse.  Previous antihypertensives: ramipril   Past Medical History:  Diagnosis Date  . Anxiety   . CAD (coronary artery disease) 08/2014   Inferior STEMI with PCI/DES to RCA - well preserved LV function  . Colon polyps   . Diabetes ([place])   . DJD (degenerative joint disease)   . Gastric polyps   . History of BPH   . HLD (hyperlipidemia)   . HTN (hypertension)   . Internal hemorrhoids   . Mitral regurgitation   . Osteoarthritis   . Overweight   . Salmonella gastroenteritis     Past Surgical History:  Procedure Laterality Date  . LEFT HEART CATHETERIZATION WITH CORONARY ANGIOGRAM N/A 08/14/2014   Procedure: LEFT HEART CATHETERIZATION WITH CORONARY ANGIOGRAM;  Surgeon: Wellington Hampshire, MD;  Location: [facility]ATH [facility];  Service: Cardiovascular;  Laterality: N/A;    Current Medications: Current Meds  Medication Sig  . aspirin-sod bicarb-citric acid (ALKA-SELTZER) 325 MG TBEF tablet Take 325 mg  by mouth at bedtime.  . carvedilol (COREG) 25 MG tablet Take 25 mg by mouth 2 (two) times daily with a meal.  . gabapentin (NEURONTIN) 300 MG capsule Take 300 mg by mouth 3 (three) times daily. Two tablets in the morning and 3 tablets in the evening.  Marland Kitchen glucose blood ([employer] TEST STRIPS) test strip by Misc.(Non-Drug; Combo Route) route.  . hydrochlorothiazide (HYDRODIURIL) 25 MG tablet Take 1 tablet (25 mg total) by mouth daily.  Marland Kitchen HYDROcodone-acetaminophen (NORCO) 10-325 MG tablet Take 1 tablet by mouth every 4 (four) hours as needed for moderate pain (Starting at 6pm daily until asleep).   . losartan (COZAAR) 100 MG tablet Take 100 mg by mouth daily.  .  metFORMIN (GLUCOPHAGE) 500 MG tablet Take 500 mg by mouth 2 (two) times daily.   . nitroGLYCERIN (NITROSTAT) 0.4 MG SL tablet Place 1 tablet (0.4 mg total) under the tongue every 5 (five) minutes x 3 doses as needed for chest pain.  Marland Kitchen oxyCODONE-acetaminophen (PERCOCET/ROXICET) 5-325 MG tablet Take by mouth every 4 (four) hours as needed for severe pain.  . rosuvastatin (CRESTOR) 20 MG tablet Take 1 tablet (20 mg total) by mouth daily.  . vitamin B-12 (CYANOCOBALAMIN) 1000 MCG tablet Take 1,000 mcg by mouth 2 (two) times daily.   . [DISCONTINUED] carvedilol (COREG) 12.5 MG tablet Take 1 tablet (12.5 mg total) by mouth 2 (two) times daily.  . [DISCONTINUED] tiZANidine (ZANAFLEX) 4 MG tablet Take 4 mg by mouth at bedtime.     Allergies:   Altace [ramipril]   Social History   Socioeconomic History  . Marital status: Married    Spouse name: Not on file  . Number of children: Not on file  . Years of education: Not on file  . Highest education level: Not on file  Occupational History  . Not on file  Tobacco Use  . Smoking status: Former Research scientist (life sciences)  . Smokeless tobacco: Current User    Types: Chew  Substance and Sexual Activity  . Alcohol use: Not on file  . Drug use: Not on file  . Sexual activity: Not on file  Other Topics Concern  . Not on file  Social History Narrative  . Not on file   Social Determinants of Health   Financial Resource Strain: Low Risk   . Difficulty of Paying Living Expenses: Not hard at all  Food Insecurity: No Food Insecurity  . Worried About Charity fundraiser in the Last Year: Never true  . Ran Out of Food in the Last Year: Never true  Transportation Needs: No Transportation Needs  . Lack of Transportation (Medical): No  . Lack of Transportation (Non-Medical): No  Physical Activity: Insufficiently Active  . Days of Exercise per Week: 7 days  . Minutes of Exercise per Session: 20 min  Stress: Stress Concern Present  . Feeling of Stress : To some extent    Social Connections:   . Frequency of Communication with Friends and Family:   . Frequency of Social Gatherings with Friends and Family:   . Attends Religious Services:   . Active Member of Clubs or Organizations:   . Attends Archivist Meetings:   Marland Kitchen Marital Status:      Family History: The patient's family history includes Hypertension in his brother and mother; Valvular heart disease in his mother.  ROS:   Please see the history of present illness.     All other systems reviewed and are negative.  EKGs/Labs/Other Studies Reviewed:  EKG:  EKG is not ordered today.  The ekg ordered today demonstrates   Recent Labs: No results found for requested labs within last 8760 hours.   Recent Lipid Panel    Component Value Date/Time   CHOL 165 08/15/2014 0019   TRIG 198 (H) 08/15/2014 0019   HDL 30 (L) 08/15/2014 0019   CHOLHDL 5.5 08/15/2014 0019   VLDL 40 08/15/2014 0019   LDLCALC 95 08/15/2014 0019    Physical Exam:    VS:  BP 132/84   Pulse 91   Ht 5\' 9"  (1.753 m)   Wt 193 lb (87.5 kg)   SpO2 94%   BMI 28.50 kg/m     Wt Readings from Last 3 Encounters:  12/25/19 193 lb (87.5 kg)  12/20/19 198 lb (89.8 kg)  01/10/19 182 lb (82.6 kg)    GENERAL:  Well appearing HEENT: Pupils equal round and reactive, fundi not visualized, oral mucosa unremarkable NECK:  No jugular venous distention, waveform within normal limits, carotid upstroke brisk and symmetric, no bruits LUNGS:  Clear to auscultation bilaterally HEART:  RRR.  PMI not displaced or sustained,S1 and S2 within normal limits, no S3, no S4, no clicks, no rubs, no murmurs ABD:  Flat, positive bowel sounds normal in frequency in pitch, no bruits, no rebound, no guarding, no midline pulsatile mass, no hepatomegaly, no splenomegaly EXT:  2 plus pulses throughout, no edema, no cyanosis no clubbing SKIN:  No rashes no nodules NEURO:  Cranial nerves II through XII grossly intact, motor grossly intact  throughout PSYCH:  Cognitively intact, oriented to person place and time   ASSESSMENT:    1. Essential hypertension   2. Essential hypertension, benign   3. Long-term use of high-risk medication     PLAN:    # Essential hypertension: Mr. Iglesias blood pressure is much better controlled though slightly above goal.  He is on multiple agent and still not controlled.  We will check renal artery Dpplers.  he consents to be monitored in our remote patient monitoring program through [place].  he will track his blood pressure twice daily and understands that these trends will help [place] to adjust his medications as needed prior to his next appointment.  he is interested in enrolling in the PREP exercise and nutrition program through the [facility]  His BP was controlled on his muscle relaxer, which is an alpha blocker.  Therefore he might do well with clonidine.  We will add a clonidine patch 0.1mg  daily.  We could also consider switching losartan to valsartan.     Disposition:    FU with MD/PharmD in 6 weeks   Medication Adjustments/Labs and Tests Ordered: Current medicines are reviewed at length with the patient today.  Concerns regarding medicines are outlined above.  Orders Placed This Encounter  Procedures  . VAS US RENAL ARTERY DUPLEX   Meds ordered this encounter  Medications  . cloNIDine (CATAPRES - DOSED IN MG/24 HR) 0.1 mg/24hr patch    Sig: Place 1 patch (0.1 mg total) onto the skin once a week.    Dispense:  4 patch    Refill:  12     Signed, Skeet Latch, MD  01/13/2020 10:32 AM    [place]

## 2020-01-02 ENCOUNTER — Other Ambulatory Visit: Payer: Self-pay

## 2020-01-02 ENCOUNTER — Ambulatory Visit (HOSPITAL_COMMUNITY)
Admission: RE | Admit: 2020-01-02 | Discharge: 2020-01-02 | Disposition: A | Discharge status: Home / Self Care | Payer: Medicare Other | Source: Ambulatory Visit | Attending: Internal Medicine | Admitting: Internal Medicine

## 2020-01-02 DIAGNOSIS — I10 Essential (primary) hypertension: Secondary | ICD-10-CM | POA: Diagnosis not present

## 2020-01-13 ENCOUNTER — Encounter: Payer: Self-pay | Admitting: Cardiovascular Disease

## 2020-02-05 ENCOUNTER — Ambulatory Visit: Payer: Medicare Other | Admitting: Pharmacist

## 2020-02-05 ENCOUNTER — Telehealth: Payer: Self-pay | Admitting: *Deleted

## 2020-02-05 ENCOUNTER — Other Ambulatory Visit: Payer: Self-pay

## 2020-02-05 VITALS — BP 138/82 | HR 76 | Ht 69.0 in | Wt 195.0 lb

## 2020-02-05 DIAGNOSIS — I10 Essential (primary) hypertension: Secondary | ICD-10-CM | POA: Diagnosis not present

## 2020-02-05 NOTE — Patient Instructions (Addendum)
Return for a  follow up appointment in 4 WEEKS  Check your blood pressure at home daily (if able) and keep record of the readings.  Take your BP meds as follows: * NO MEDICATION CHANGE* * WILL WORK ON TRANSITION FROM CLONIDINE PATCH BACK TO LOSARTAN DURING NEXT APPOINTMENT*  Bring all of your meds, your BP cuff and your record of home blood pressures to your next appointment.  Exercise as you're able, try to walk approximately 30 minutes per day.  Keep salt intake to a minimum, especially watch canned and prepared boxed foods.  Eat more fresh fruits and vegetables and fewer canned items.  Avoid eating in fast food restaurants.    HOW TO TAKE YOUR BLOOD PRESSURE: . Rest 5 minutes before taking your blood pressure. .  Don't smoke or drink caffeinated beverages for at least 30 minutes before. . Take your blood pressure before (not after) you eat. . Sit comfortably with your back supported and both feet on the floor (don't cross your legs). . Elevate your arm to heart level on a table or a desk. . Use the proper sized cuff. It should fit smoothly and snugly around your bare upper arm. There should be enough room to slip a fingertip under the cuff. The bottom edge of the cuff should be 1 inch above the crease of the elbow. . Ideally, take 3 measurements at one sitting and record the average.

## 2020-02-05 NOTE — Telephone Encounter (Signed)
Patient  like to know if DR Oval Linsey can look at MRI results done by [facility] and compared to vascula rimagine done 4 months ago. He is worried about cyst on left kidney. Will forward to Dr Oval Linsey for review

## 2020-02-05 NOTE — Progress Notes (Signed)
Patient ID: Karl Alvarez                 DOB: 1955/11/30                      MRN: [id number]     HPI: Karl Alvarez is a 64 y.o. male referred by Dr. Oval Linsey to HTN clinic. PMH includes CAD s/p STEMI, hypertension, hyperlipidemia, DM, and chronic pain. Patient has difficult to control hypertension and working with PCP and cardiologist for BP management. Also reports chronic constipation d/t  pain medication currently managed by PCP.    Patient referred to PREP program, and monitor home BP with vivify (last entry 12/25/2019). He takes Investment banker, operational daily and "doing it for years", refuses to stop. He  self-adjusted his BP medication and will like to come off clonidine patch due to high cost and nausea ,but will like to finish current supply. Patient main concern today if the cyst found on his kidney and will like Dr Oval Linsey to compare old MRI done by orthopedic doctor to current imagine.   Current HTN meds:  Carvedilol 25mg  twice daily Clonidine patch 0.1mg  weekly HCTZ 25mg  daily - stopped taking for 1 week, then resume at 12.5mg  at bedtime losartan 100mg  daily - taking 50mg  daily only  Previously tried:  Ramipril - cough  BP goal: <130/80  Family History: The patient's family history includes Hypertension in his brother and mother; Valvular heart disease in his mother.  Social History: former smoker, currently chew tobacco, denies alcohol   Exercise:  He doesn't get much exercise but works out in his yard.  Home BP readings: no records provided today and no download to vivify either  Per patient recollection "bp was dropping to 90/50 in the evenings" - dizzy and sick to stomach Morning 160/90; evenings 110-120/70s  Wt Readings from Last 3 Encounters:  02/05/20 195 lb (88.5 kg)  12/25/19 193 lb (87.5 kg)  12/20/19 198 lb (89.8 kg)   BP Readings from Last 3 Encounters:  02/05/20 138/82  12/25/19 132/84  12/20/19 (!) 152/96   Pulse Readings from Last 3 Encounters:    02/05/20 76  12/25/19 91  12/20/19 86    Past Medical History:  Diagnosis Date  . Anxiety   . CAD (coronary artery disease) 08/2014   Inferior STEMI with PCI/DES to RCA - well preserved LV function  . Colon polyps   . Diabetes ([place])   . DJD (degenerative joint disease)   . Gastric polyps   . History of BPH   . HLD (hyperlipidemia)   . HTN (hypertension)   . Internal hemorrhoids   . Mitral regurgitation   . Osteoarthritis   . Overweight   . Salmonella gastroenteritis     Current Outpatient Medications on File Prior to Visit  Medication Sig Dispense Refill  . aspirin-sod bicarb-citric acid (ALKA-SELTZER) 325 MG TBEF tablet Take 325 mg by mouth at bedtime.    . calcium-vitamin D 250-100 MG-UNIT tablet Take 1 tablet by mouth 2 (two) times daily.    . carvedilol (COREG) 25 MG tablet Take 25 mg by mouth 2 (two) times daily with a meal.    . cloNIDine (CATAPRES - DOSED IN MG/24 HR) 0.1 mg/24hr patch Place 1 patch (0.1 mg total) onto the skin once a week. 4 patch 12  . ferrous sulfate 325 (65 FE) MG tablet Take 325 mg by mouth daily with breakfast.    . gabapentin (NEURONTIN) 300 MG capsule  Take 300 mg by mouth 3 (three) times daily. Two tablets in the morning and 3 tablets in the evening.    . hydrochlorothiazide (HYDRODIURIL) 25 MG tablet Take 1 tablet (25 mg total) by mouth daily. 90 tablet 3  . losartan (COZAAR) 100 MG tablet Take 50 mg by mouth daily.     . metFORMIN (GLUCOPHAGE) 500 MG tablet Take 500 mg by mouth 2 (two) times daily.     . multivitamin (ONE-A-DAY MEN'S) TABS tablet Take 1 tablet by mouth daily.    Marland Kitchen oxyCODONE-acetaminophen (PERCOCET/ROXICET) 5-325 MG tablet Take by mouth every 4 (four) hours as needed for severe pain.    . rosuvastatin (CRESTOR) 20 MG tablet Take 1 tablet (20 mg total) by mouth daily. 90 tablet 3  . vitamin B-12 (CYANOCOBALAMIN) 1000 MCG tablet Take 1,000 mcg by mouth 2 (two) times daily.     . nitroGLYCERIN (NITROSTAT) 0.4 MG SL tablet Place 1  tablet (0.4 mg total) under the tongue every 5 (five) minutes x 3 doses as needed for chest pain. (Patient not taking: Reported on 02/05/2020) 25 tablet 5   No current facility-administered medications on file prior to visit.    Allergies  Allergen Reactions  . Altace [Ramipril] Cough    Blood pressure 138/82, pulse 76, height 5\' 9"  (1.753 m), weight 195 lb (88.5 kg), SpO2 98 %.  Essential hypertension, benign Blood pressure remains above goal, but patient is somehow reluctant to changes today. He reports nausea while on clonidine patch and gabapentin. He didn't have this issues while taking gabapentin until clonidine patch was added to therapy. He will like to stop using clonidine patch after current supply is gone. Patient also self-adjusted his medication to HCTZ 12.5mg  daily (instead of 25mg ) and losartan 50mg  daily (instead of 100mg ) d/t low BP readings (none provided today). He is not using vivify and didn't brought his home BP cuff to calibrate.   Patient main concern if follow up on renal cyst. He will like DR Oval Linsey to compare current imagine with MRI done by [facility] in the past to re-assess size of the cyst and potential to cause of back pain.  Will continue medication without changes, follow up in 4 weeks to transition patient from clonidine patch. Medical Records request was completed to acquire MRI from [facility] and message was sent to Dr Oval Linsey for follow up.  Plan to stop clonidine patch, stop losartan and start valsartan 320mg  daily during next office visit if patient agreeable.    Tiphany Fayson Rodriguez-Guzman PharmD, BCPS, [place] [address] [place] [address] 02/07/2020 4:55 PM

## 2020-02-06 NOTE — Telephone Encounter (Signed)
I can't see the imaging from [facility].  There is nothing to do about a simple cyst in the kidney unless it is causing problems.  He can discuss more with his PCP and see if they suggest urology referral.

## 2020-02-06 NOTE — Telephone Encounter (Signed)
Spoke with patient who stated he was having pain in his right side in "kidney area". Advised to contact PCP to discuss. Patient verbalized understanding

## 2020-02-07 ENCOUNTER — Encounter: Payer: Self-pay | Admitting: Pharmacist

## 2020-02-07 NOTE — Assessment & Plan Note (Signed)
Blood pressure remains above goal, but patient is somehow reluctant to changes today. He reports nausea while on clonidine patch and gabapentin. He didn't have this issues while taking gabapentin until clonidine patch was added to therapy. He will like to stop using clonidine patch after current supply is gone. Patient also self-adjusted his medication to HCTZ 12.5mg  daily (instead of 25mg ) and losartan 50mg  daily (instead of 100mg ) d/t low BP readings (none provided today). He is not using vivify and didn't brought his home BP cuff to calibrate.   Patient main concern if follow up on renal cyst. He will like DR Oval Linsey to compare current imagine with MRI done by [facility] in the past to re-assess size of the cyst and potential to cause of back pain.  Will continue medication without changes, follow up in 4 weeks to transition patient from clonidine patch. Medical Records request was completed to acquire MRI from [facility] and message was sent to Dr Oval Linsey for follow up.  Plan to stop clonidine patch, stop losartan and start valsartan 320mg  daily during next office visit if patient agreeable.

## 2020-03-05 ENCOUNTER — Other Ambulatory Visit: Payer: Self-pay

## 2020-03-05 ENCOUNTER — Ambulatory Visit (INDEPENDENT_AMBULATORY_CARE_PROVIDER_SITE_OTHER): Payer: Medicare Other | Admitting: Pharmacist

## 2020-03-05 VITALS — BP 164/90 | HR 85 | Resp 16 | Ht 69.0 in | Wt 194.6 lb

## 2020-03-05 DIAGNOSIS — I10 Essential (primary) hypertension: Secondary | ICD-10-CM | POA: Diagnosis not present

## 2020-03-05 MED ORDER — HYDROCHLOROTHIAZIDE 25 MG PO TABS: 25.0000 mg | ORAL_TABLET | Freq: Every day | ORAL | 3 refills | Status: DC

## 2020-03-05 NOTE — Progress Notes (Signed)
Patient ID: Karl Alvarez                 DOB: February 26, 1956                      MRN: [id number]     HPI: Karl Alvarez is a 64 y.o. male referred by Dr. Oval Linsey to HTN clinic. PMH includes CAD s/p STEMI, hypertension, hyperlipidemia, DM, and chronic pain. Patient has difficult to control hypertension and working with PCP and cardiologist for BP management. Also reports chronic constipation d/t  pain medication currently managed by PCP.    Patient continues to self-adjust therapy and reports stopping clonidine patch about 3 weeks ago. He takes Investment banker, operational daily and "doing it for years", refuses to stop. No BP records provided today and last vivify entry was 12-25-19. He denies dizziness, nausea, vomiting, or increase fatigue.   Current HTN meds:  Carvedilol 25mg  twice daily  Losartan 100mg  taking 1/2 tablet twice daily  Previously tried:  Ramipril - cough  BP goal: <130/80  Family History: The patient's family history includes Hypertension in his brother and mother; Valvular heart disease in his mother.  Social History: former smoker, currently chew tobacco, denies alcohol   Exercise:  He doesn't get much exercise but works out in his yard.  Home BP readings: no records provided today and no download to vivify either   Wt Readings from Last 3 Encounters:  03/05/20 194 lb 9.6 oz (88.3 kg)  02/05/20 195 lb (88.5 kg)  12/25/19 193 lb (87.5 kg)   BP Readings from Last 3 Encounters:  03/05/20 (!) 164/90  02/05/20 138/82  12/25/19 132/84   Pulse Readings from Last 3 Encounters:  03/05/20 85  02/05/20 76  12/25/19 91    Past Medical History:  Diagnosis Date   Anxiety    CAD (coronary artery disease) 08/2014   Inferior STEMI with PCI/DES to RCA - well preserved LV function   Colon polyps    Diabetes ([place])    DJD (degenerative joint disease)    Gastric polyps    History of BPH    HLD (hyperlipidemia)    HTN (hypertension)    Internal hemorrhoids     Mitral regurgitation    Osteoarthritis    Overweight    Salmonella gastroenteritis     Current Outpatient Medications on File Prior to Visit  Medication Sig Dispense Refill   aspirin-sod bicarb-citric acid (ALKA-SELTZER) 325 MG TBEF tablet Take 325 mg by mouth at bedtime.     calcium-vitamin D 250-100 MG-UNIT tablet Take 1 tablet by mouth 2 (two) times daily.     carvedilol (COREG) 25 MG tablet Take 25 mg by mouth 2 (two) times daily with a meal.     ferrous sulfate 325 (65 FE) MG tablet Take 325 mg by mouth daily with breakfast.     gabapentin (NEURONTIN) 300 MG capsule Take 300 mg by mouth 3 (three) times daily. Two tablets in the morning and 3 tablets in the evening.     losartan (COZAAR) 100 MG tablet Take 50 mg by mouth daily.      metFORMIN (GLUCOPHAGE) 500 MG tablet Take 500 mg by mouth 2 (two) times daily.      multivitamin (ONE-A-DAY MEN'S) TABS tablet Take 1 tablet by mouth daily.     nitroGLYCERIN (NITROSTAT) 0.4 MG SL tablet Place 1 tablet (0.4 mg total) under the tongue every 5 (five) minutes x 3 doses as needed for chest pain.  25 tablet 5   oxyCODONE-acetaminophen (PERCOCET/ROXICET) 5-325 MG tablet Take by mouth every 4 (four) hours as needed for severe pain.     rosuvastatin (CRESTOR) 20 MG tablet Take 1 tablet (20 mg total) by mouth daily. 90 tablet 3   vitamin B-12 (CYANOCOBALAMIN) 1000 MCG tablet Take 1,000 mcg by mouth 2 (two) times daily.      No current facility-administered medications on file prior to visit.    Allergies  Allergen Reactions   Altace [Ramipril] Cough    Blood pressure (!) 164/90, pulse 85, resp. rate 16, height 5\' 9"  (1.753 m), weight 194 lb 9.6 oz (88.3 kg), SpO2 96 %.  HTN (hypertension) BP remains above goal and patient continues to self-adjust medication without PCP or cardiologist input. He reports monitoring BP every ,but last vivify entry was April/2021 and no records provided today either.  Will resume HCTZ 25mg  daily  and follow up with patient over the phone in 2 weeks. If HCTZ not tolerated or "not working" , plan to change therapy to amlodipine 5mg  daily. In person follow up in 4 weeks already scheduled.     Sheehan Stacey Rodriguez-Guzman PharmD, BCPS, [place] [address] [place] [address] 03/05/2020 5:13 PM

## 2020-03-05 NOTE — Assessment & Plan Note (Signed)
BP remains above goal and patient continues to self-adjust medication without PCP or cardiologist input. He reports monitoring BP every ,but last vivify entry was April/2021 and no records provided today either.  Will resume HCTZ 25mg  daily and follow up with patient over the phone in 2 weeks. If HCTZ not tolerated or "not working" , plan to change therapy to amlodipine 5mg  daily. In person follow up in 4 weeks already scheduled.

## 2020-03-05 NOTE — Patient Instructions (Addendum)
Return for a  follow up appointment in 2 weeks by phone and 4 weeks in person  Check your blood pressure at home daily (if able) and keep record of the readings.  Take your BP meds as follows: *RESUME HCTZ 25mg  daily, will re-asses by phone in 2 weeks, if blood pressure remains above goal, will replace with Amlodipine 5mg  daily*  Bring all of your meds, your BP cuff and your record of home blood pressures to your next appointment.  Exercise as you're able, try to walk approximately 30 minutes per day.  Keep salt intake to a minimum, especially watch canned and prepared boxed foods.  Eat more fresh fruits and vegetables and fewer canned items.  Avoid eating in fast food restaurants.    HOW TO TAKE YOUR BLOOD PRESSURE: . Rest 5 minutes before taking your blood pressure. .  Don't smoke or drink caffeinated beverages for at least 30 minutes before. . Take your blood pressure before (not after) you eat. . Sit comfortably with your back supported and both feet on the floor (don't cross your legs). . Elevate your arm to heart level on a table or a desk. . Use the proper sized cuff. It should fit smoothly and snugly around your bare upper arm. There should be enough room to slip a fingertip under the cuff. The bottom edge of the cuff should be 1 inch above the crease of the elbow. . Ideally, take 3 measurements at one sitting and record the average.

## 2020-03-20 ENCOUNTER — Telehealth: Payer: Self-pay | Admitting: Pharmacist

## 2020-03-20 NOTE — Telephone Encounter (Signed)
Patient taking HCTZ and re-adjusted medication.  "Good numbers" no reading provided. Patient request call back tomorrow after 4pm

## 2020-03-21 MED ORDER — HYDROCHLOROTHIAZIDE 25 MG PO TABS: 25.0000 mg | ORAL_TABLET | Freq: Every day | ORAL | 3 refills | Status: DC

## 2020-03-21 MED ORDER — LOSARTAN POTASSIUM 50 MG PO TABS: 50.0000 mg | ORAL_TABLET | Freq: Every day | ORAL | 3 refills | Status: DC

## 2020-03-21 MED ORDER — CARVEDILOL 25 MG PO TABS: ORAL_TABLET | ORAL | 3 refills | Status: DC

## 2020-03-21 NOTE — Telephone Encounter (Signed)
Takes medication in AM and after supper: Losartan 50mg  in AM Carvedilol 25mg  in AM   Supper: Carvedilol 25mg     HCTZ in mid-night (goes to bed around 2am)   Blood pressure readings after supper: 92/53 96/55   Recommendation:

## 2020-03-21 NOTE — Addendum Note (Signed)
Addended by: Harrington Challenger on: 03/21/2020 05:04 PM   Modules accepted: Orders

## 2020-04-07 ENCOUNTER — Telehealth: Payer: Self-pay

## 2020-04-07 DIAGNOSIS — Z0000 Encounter for general adult medical examination without abnormal findings: Secondary | ICD-10-CM

## 2020-04-07 NOTE — Telephone Encounter (Signed)
Called patient to discuss BP checks in [place]. Patient states that he writes down his BP daily and bring in to his appts. Patient has an appt with the Pharmacy on 04/08/20 and will bring his readings then.

## 2020-04-08 ENCOUNTER — Other Ambulatory Visit: Payer: Self-pay

## 2020-04-08 ENCOUNTER — Ambulatory Visit (INDEPENDENT_AMBULATORY_CARE_PROVIDER_SITE_OTHER): Payer: Medicare Other | Admitting: Pharmacist

## 2020-04-08 VITALS — BP 132/76 | HR 78 | Ht 69.0 in | Wt 188.0 lb

## 2020-04-08 DIAGNOSIS — I10 Essential (primary) hypertension: Secondary | ICD-10-CM

## 2020-04-08 MED ORDER — HYDROCHLOROTHIAZIDE 12.5 MG PO CAPS: 12.5000 mg | ORAL_CAPSULE | Freq: Every day | ORAL | 3 refills | Status: DC

## 2020-04-08 NOTE — Progress Notes (Signed)
Patient ID: [name]                 DOB: [birth year]                      MRN: [id number]     HPI: Karl Alvarez is a 64 y.o. male referred by Dr. Oval Linsey to HTN clinic. PMH includes CAD s/p STEMI, hypertension, hyperlipidemia, DM, and chronic pain.  Patient continues to self-adjust medication but brought home readying today for evaluation.   He takes Investment banker, operational daily and "doing it for years", refuses to stop.  Reports occasional dizziness, no falls.  Current HTN meds:  Carvedilol 25mg  every morning and 12.5mg  every evening Losartan 50mg  daily HCTZ 25mg  daily   Previously tried:  Ramipril - cough  BP goal: <130/80  Family History: The patient's family history includes Hypertension in his brother and mother; Valvular heart disease in his mother.  Social History: former smoker, currently chew tobacco, denies alcohol   Exercise:  He doesn't get much exercise but works out in his yard.  Home BP readings:  15 readings: average 112/66; HR range 74-93bpm  Wt Readings from Last 3 Encounters:  04/08/20 188 lb (85.3 kg)  03/05/20 194 lb 9.6 oz (88.3 kg)  02/05/20 195 lb (88.5 kg)   BP Readings from Last 3 Encounters:  04/08/20 132/76  03/05/20 (!) 164/90  02/05/20 138/82   Pulse Readings from Last 3 Encounters:  04/08/20 78  03/05/20 85  02/05/20 76    Past Medical History:  Diagnosis Date  . Anxiety   . CAD (coronary artery disease) 08/2014   Inferior STEMI with PCI/DES to RCA - well preserved LV function  . Colon polyps   . Diabetes ([place])   . DJD (degenerative joint disease)   . Gastric polyps   . History of BPH   . HLD (hyperlipidemia)   . HTN (hypertension)   . Internal hemorrhoids   . Mitral regurgitation   . Osteoarthritis   . Overweight   . Salmonella gastroenteritis     Current Outpatient Medications on File Prior to Visit  Medication Sig Dispense Refill  . aspirin-sod bicarb-citric acid (ALKA-SELTZER) 325 MG TBEF tablet Take 325 mg by  mouth at bedtime.    . calcium-vitamin D 250-100 MG-UNIT tablet Take 1 tablet by mouth 2 (two) times daily.    . carvedilol (COREG) 25 MG tablet Take 25mg  in AM and 12.5mg  at supper 135 tablet 3  . ferrous sulfate 325 (65 FE) MG tablet Take 325 mg by mouth daily with breakfast.    . gabapentin (NEURONTIN) 300 MG capsule Take 300 mg by mouth 3 (three) times daily. Two tablets in the morning and 3 tablets in the evening.    Marland Kitchen losartan (COZAAR) 50 MG tablet Take 1 tablet (50 mg total) by mouth daily. 90 tablet 3  . metFORMIN (GLUCOPHAGE) 500 MG tablet Take 500 mg by mouth 2 (two) times daily.     . multivitamin (ONE-A-DAY MEN'S) TABS tablet Take 1 tablet by mouth daily.    . nitroGLYCERIN (NITROSTAT) 0.4 MG SL tablet Place 1 tablet (0.4 mg total) under the tongue every 5 (five) minutes x 3 doses as needed for chest pain. 25 tablet 5  . oxyCODONE-acetaminophen (PERCOCET/ROXICET) 5-325 MG tablet Take by mouth every 4 (four) hours as needed for severe pain.    . rosuvastatin (CRESTOR) 20 MG tablet Take 1 tablet (20 mg total) by mouth daily. 90 tablet 3  .  vitamin B-12 (CYANOCOBALAMIN) 1000 MCG tablet Take 1,000 mcg by mouth 2 (two) times daily.      No current facility-administered medications on file prior to visit.    Allergies  Allergen Reactions  . Altace [Ramipril] Cough    Blood pressure 132/76, pulse 78, height 5\' 9"  (1.753 m), weight 188 lb (85.3 kg), SpO2 98 %.  Essential hypertension, benign Blood pressure well controlled at home and during OV. Patient continues to report loe readings and some dizziness in the evenings.  Will decrease HCTZ to 12.5mg  daily and follow up by phone in 2 weeks.  Message sent to [place] to schedule final visit with Dr Oval Linsey in 4-5 weeks.  Shresta Risden Rodriguez-Guzman PharmD, BCPS, [place] [id number] 04/09/2020 5:28 PM

## 2020-04-08 NOTE — Patient Instructions (Addendum)
Return for a  follow up appointment in 1 month with Dr Oval Linsey (Aug/18 at 2:30pm by phone with pharmacist)  Check your blood pressure at home daily (if able) and keep record of the readings.  Take your BP meds as follows: *DECREASE  HCTZ 12.5mg  daily*  Bring all of your meds, your BP cuff and your record of home blood pressures to your next appointment.  Exercise as you're able, try to walk approximately 30 minutes per day.  Keep salt intake to a minimum, especially watch canned and prepared boxed foods.  Eat more fresh fruits and vegetables and fewer canned items.  Avoid eating in fast food restaurants.    HOW TO TAKE YOUR BLOOD PRESSURE: . Rest 5 minutes before taking your blood pressure. .  Don't smoke or drink caffeinated beverages for at least 30 minutes before. . Take your blood pressure before (not after) you eat. . Sit comfortably with your back supported and both feet on the floor (don't cross your legs). . Elevate your arm to heart level on a table or a desk. . Use the proper sized cuff. It should fit smoothly and snugly around your bare upper arm. There should be enough room to slip a fingertip under the cuff. The bottom edge of the cuff should be 1 inch above the crease of the elbow. . Ideally, take 3 measurements at one sitting and record the average.

## 2020-04-09 ENCOUNTER — Encounter: Payer: Self-pay | Admitting: Pharmacist

## 2020-04-09 NOTE — Assessment & Plan Note (Signed)
Blood pressure well controlled at home and during OV. Patient continues to report loe readings and some dizziness in the evenings.  Will decrease HCTZ to 12.5mg  daily and follow up by phone in 2 weeks.  Message sent to [place] to schedule final visit with Dr Oval Linsey in 4-5 weeks.

## 2020-04-25 ENCOUNTER — Telehealth: Payer: Self-pay | Admitting: *Deleted

## 2020-04-25 NOTE — Telephone Encounter (Signed)
Spoke with patient to scheduled his 1 month follow up visit with Dr Oval Linsey  Per patient Pharm D was to call him and follow up on how his blood pressure were doing Offered to take the readings however he only wanted to speak with Pharm D Patient also declined making the follow up with Dr Oval Linsey until after speaking with Pharm D Will forward to Pharm D for review

## 2020-04-28 NOTE — Telephone Encounter (Signed)
Tried calling pt, no answer.

## 2020-04-30 MED ORDER — HYDROCHLOROTHIAZIDE 12.5 MG PO CAPS: 12.5000 mg | ORAL_CAPSULE | Freq: Every day | ORAL | 3 refills | Status: DC

## 2020-04-30 MED ORDER — LOSARTAN POTASSIUM 50 MG PO TABS: 25.0000 mg | ORAL_TABLET | Freq: Two times a day (BID) | ORAL | 3 refills | Status: DC

## 2020-04-30 MED ORDER — CARVEDILOL 12.5 MG PO TABS: ORAL_TABLET | ORAL | 3 refills | Status: DC

## 2020-04-30 NOTE — Telephone Encounter (Signed)
Patient adjust medication as follow:   Carvedilol 12.5mg  in AM and 12.5mg  with supper  Losartan 50mg  tablet in AM and 1/2 tablet atedtime  HCTZ 12.5mg  at bedtime  Follow up appt already with DR Oval Linsey already scheduled for 05/15/2020

## 2020-05-15 ENCOUNTER — Ambulatory Visit: Payer: Medicare Other | Admitting: Cardiovascular Disease

## 2020-05-15 ENCOUNTER — Other Ambulatory Visit: Payer: Self-pay

## 2020-05-15 ENCOUNTER — Encounter: Payer: Self-pay | Admitting: Cardiovascular Disease

## 2020-05-15 VITALS — BP 126/80 | HR 72 | Ht 69.0 in | Wt 190.0 lb

## 2020-05-15 DIAGNOSIS — I10 Essential (primary) hypertension: Secondary | ICD-10-CM | POA: Diagnosis not present

## 2020-05-15 DIAGNOSIS — E7800 Pure hypercholesterolemia, unspecified: Secondary | ICD-10-CM

## 2020-05-15 NOTE — Progress Notes (Signed)
Hypertension Clinic Follow Up    Date:  05/29/2020   ID:  Karl Alvarez, DOB [birth year], MRN [id number]  PCP:  Karl Alvarez., MD  Cardiologist:  Karl Furbish, MD   Referring MD: Karl Alvarez., MD   CC: Hypertension  History of Present Illness:    Karl Alvarez is a 64 y.o. male with a hx of CA s/p inferior STEMI (DES to RCA in 2015), hypertension, hyperlipidemia, DM, and chronic pain here to establish care in the hypertension clinic. He notes that his BP has been more difficult to control lately.  He has been working with his PCP and Dr. Marlou Alvarez.  Carvedilol was increased to 12.5mg  and HCTZ was increased to 25mg .  Since then his BP has been well-controlled.  After eating his BP goes up as high as the 200s.  His BP seems to better a couple hours after eating.  When his BP increases he has feel it pounding his neck.  Dr. Marlou Alvarez increased HCTZ to 25 on 4/15.  Carvedilol was also recently doubled.    Lately he has struggled with constipation and abdominal pain due to pain medications.  He takes it chronically for decades. He has been trying to reduce the medication and struggles with chronic pain in his back and leg.  He broke his leg 08/2019.  He broke it by twisting his ankle and tripping over a door mat.  There are no preceding cardiac symptoms.  He started taking calcium and vitamin D due to concern for possible osteoporosis.  He switched from hydrocodone to oxycodone.  He did physical therapy for four visits but didn't feel like it was helpful. He noticed that when he took his muscle relaxer his BP was easier to control and it was easier for him to sleep.  This was discontinued on 3/26.  He doesn't get much exercise but works out in his yard.  He denies exertional chest pain but has some shortness of breath.  He notes that his diet isn't as good as it used to.  He takes a lot of vitamins and supplements (calcium, Centrum multivitamin, B12, and iron) because he doesn't eat regular  meals.  He struggles with chronic constipation and abdominal pain.  He snores occasionally and typically sleeps less than 4 hours/day.  He is unsure about apnea and does not feel rested when he wakes up.  He reports feeling lightheaded when he squats and when he stands.  Since increasing the carvedilol he notes that this has been worse.  He last our pharmacist on 04/30/2020 and reported some low readings with dizziness in the evening.  Losartan was adjusted to 50 mg in the morning and 25 in the evening with HCTZ 12.5 in the evening and carvedilol 12.5 mg every 12 hours.  He notices that his BP has been running pretty low in the daytime.  It typically is in the 100s to 120s.  Has had some episodes where it drops into the 90s and he feels very dizzy.  This is especially prominent when he squats down and tries to stand back up.  He denies any syncope but has had some presyncope.  He notes his blood pressure tends to run very high when he is sleeping at night.  Sometimes he wakes up all hot and sweaty and has to check his blood pressure and it will be as high as 160.  He continues to take Alka-Seltzer's daily.  After taking his medication with dinner his BP  dropped too much and he had orthostatic symptoms.  He switched this to taking losartan and carvedilol at bedtime since his BP seems to go up.  His BP gets as high as 160s/90s.  He brings a log of his BP showing that lately it is ranging 90s-120s/60-70s.  He notes that he isn't sleeping well and is up every hour.  He has been walking his dog and his breathing is stable.   Previous antihypertensives: ramipril   Past Medical History:  Diagnosis Date  . Anxiety   . CAD (coronary artery disease) 08/2014   Inferior STEMI with PCI/DES to RCA - well preserved LV function  . Colon polyps   . Diabetes ([place])   . DJD (degenerative joint disease)   . Gastric polyps   . History of BPH   . HLD (hyperlipidemia)   . HTN (hypertension)   . Internal hemorrhoids   .  Mitral regurgitation   . Osteoarthritis   . Overweight   . Salmonella gastroenteritis     Past Surgical History:  Procedure Laterality Date  . LEFT HEART CATHETERIZATION WITH CORONARY ANGIOGRAM N/A 08/14/2014   Procedure: LEFT HEART CATHETERIZATION WITH CORONARY ANGIOGRAM;  Surgeon: Wellington Hampshire, MD;  Location: [facility]ATH [facility];  Service: Cardiovascular;  Laterality: N/A;    Current Medications: Current Meds  Medication Sig  . aspirin-sod bicarb-citric acid (ALKA-SELTZER) 325 MG TBEF tablet Take 325 mg by mouth at bedtime.  Marland Kitchen CALCIUM CITRATE PO Take by mouth.  . calcium-vitamin D 250-100 MG-UNIT tablet Take 1 tablet by mouth 2 (two) times daily.  . carvedilol (COREG) 12.5 MG tablet Take 12.5mg  in AM and 12.5mg  at supper  . colchicine 0.6 MG tablet Take by mouth.  . ferrous sulfate 325 (65 FE) MG tablet Take 325 mg by mouth daily with breakfast.  . gabapentin (NEURONTIN) 300 MG capsule Take 300 mg by mouth 3 (three) times daily. Two tablets in the morning and 3 tablets in the evening.  Marland Kitchen losartan (COZAAR) 50 MG tablet Take 50 mg by mouth as directed. TAKE 1/2 IN THE AM AND 1 IN THE PM  . metFORMIN (GLUCOPHAGE) 500 MG tablet Take 500 mg by mouth 2 (two) times daily.   . multivitamin (ONE-A-DAY MEN'S) TABS tablet Take 1 tablet by mouth daily.  . nitroGLYCERIN (NITROSTAT) 0.4 MG SL tablet Place 1 tablet (0.4 mg total) under the tongue every 5 (five) minutes x 3 doses as needed for chest pain.  Marland Kitchen oxyCODONE-acetaminophen (PERCOCET/ROXICET) 5-325 MG tablet Take by mouth every 4 (four) hours as needed for severe pain.  . rosuvastatin (CRESTOR) 20 MG tablet Take 1 tablet (20 mg total) by mouth daily.  . vitamin B-12 (CYANOCOBALAMIN) 1000 MCG tablet Take 1,000 mcg by mouth 2 (two) times daily.   . [DISCONTINUED] hydrochlorothiazide (MICROZIDE) 12.5 MG capsule Take 1 capsule (12.5 mg total) by mouth at bedtime.  . [DISCONTINUED] losartan (COZAAR) 50 MG tablet Take 0.5 tablets (25 mg total) by mouth  2 (two) times daily with a meal.     Allergies:   Altace [ramipril]   Social History   Socioeconomic History  . Marital status: Married    Spouse name: Not on file  . Number of children: Not on file  . Years of education: Not on file  . Highest education level: Not on file  Occupational History  . Not on file  Tobacco Use  . Smoking status: Former Research scientist (life sciences)  . Smokeless tobacco: Current User    Types: Chew  Vaping  Use  . Vaping Use: Never used  Substance and Sexual Activity  . Alcohol use: Not on file  . Drug use: Not on file  . Sexual activity: Not on file  Other Topics Concern  . Not on file  Social History Narrative  . Not on file   Social Determinants of Health   Financial Resource Strain: Low Risk   . Difficulty of Paying Living Expenses: Not hard at all  Food Insecurity: No Food Insecurity  . Worried About Charity fundraiser in the Last Year: Never true  . Ran Out of Food in the Last Year: Never true  Transportation Needs: No Transportation Needs  . Lack of Transportation (Medical): No  . Lack of Transportation (Non-Medical): No  Physical Activity: Insufficiently Active  . Days of Exercise per Week: 7 days  . Minutes of Exercise per Session: 20 min  Stress: Stress Concern Present  . Feeling of Stress : To some extent  Social Connections:   . Frequency of Communication with Friends and Family: Not on file  . Frequency of Social Gatherings with Friends and Family: Not on file  . Attends Religious Services: Not on file  . Active Member of Clubs or Organizations: Not on file  . Attends Archivist Meetings: Not on file  . Marital Status: Not on file     Family History: The patient's family history includes Hypertension in his brother and mother; Valvular heart disease in his mother.  ROS:   Please see the history of present illness.     All other systems reviewed and are negative.  EKGs/Labs/Other Studies Reviewed:    EKG:  EKG is not ordered  today.  The ekg ordered today demonstrates   Recent Labs: No results found for requested labs within last 8760 hours.   Recent Lipid Panel    Component Value Date/Time   CHOL 165 08/15/2014 0019   TRIG 198 (H) 08/15/2014 0019   HDL 30 (L) 08/15/2014 0019   CHOLHDL 5.5 08/15/2014 0019   VLDL 40 08/15/2014 0019   LDLCALC 95 08/15/2014 0019    Physical Exam:    VS:  BP 126/80   Pulse 72   Ht 5\' 9"  (1.753 m)   Wt 190 lb (86.2 kg)   SpO2 97%   BMI 28.06 kg/m     Wt Readings from Last 3 Encounters:  05/15/20 190 lb (86.2 kg)  04/08/20 188 lb (85.3 kg)  03/05/20 194 lb 9.6 oz (88.3 kg)    GENERAL:  Well appearing HEENT: Pupils equal round and reactive, fundi not visualized, oral mucosa unremarkable NECK:  No jugular venous distention, waveform within normal limits, carotid upstroke brisk and symmetric, no bruits LUNGS:  Clear to auscultation bilaterally HEART:  RRR.  PMI not displaced or sustained,S1 and S2 within normal limits, no S3, no S4, no clicks, no rubs, no murmurs ABD:  Flat, positive bowel sounds normal in frequency in pitch, no bruits, no rebound, no guarding, no midline pulsatile mass, no hepatomegaly, no splenomegaly EXT:  2 plus pulses throughout, no edema, no cyanosis no clubbing SKIN:  No rashes no nodules NEURO:  Cranial nerves II through XII grossly intact, motor grossly intact throughout PSYCH:  Cognitively intact, oriented to person place and time   ASSESSMENT:    No diagnosis found.  PLAN:    # Essential hypertension: Karl Alvarez blood pressure is much better controlled but still above goal.  We will stop HCTZ and start losartan 25mg  in the  am and 50mg  in the evening.  Renal artery Dopplers were negative 01/2020.  Continue to track BP twice daily in [place].  Continue carvedilol.    Disposition:    FU with MD/PharmD in 6 weeks   Medication Adjustments/Labs and Tests Ordered: Current medicines are reviewed at length with the patient today.   Concerns regarding medicines are outlined above.  No orders of the defined types were placed in this encounter.  No orders of the defined types were placed in this encounter.    Signed, Skeet Latch, MD  05/29/2020 11:51 AM    [place]

## 2020-05-15 NOTE — Patient Instructions (Addendum)
Medication Instructions:  STOP HYDROCHLOROTHIAZIDE  START LOSARTAN 25 MG IN THE MORNING AND 50 MG IN THE EVENING     Labwork: NONE   Testing/Procedures: NONE   Follow-Up: DR [facility] 06/11/2020 AT 2:00 PM

## 2020-05-27 NOTE — Addendum Note (Signed)
Addended by: Mike Craze on: 05/27/2020 04:03 PM   Modules accepted: Orders

## 2020-06-11 ENCOUNTER — Ambulatory Visit: Payer: Medicare Other | Admitting: Cardiovascular Disease

## 2020-06-12 ENCOUNTER — Other Ambulatory Visit: Payer: Self-pay

## 2020-06-12 ENCOUNTER — Ambulatory Visit: Payer: Medicare Other | Admitting: Cardiovascular Disease

## 2020-06-12 ENCOUNTER — Encounter: Payer: Self-pay | Admitting: Cardiovascular Disease

## 2020-06-12 VITALS — BP 150/92 | HR 83 | Ht 69.0 in | Wt 191.0 lb

## 2020-06-12 DIAGNOSIS — I10 Essential (primary) hypertension: Secondary | ICD-10-CM | POA: Diagnosis not present

## 2020-06-12 DIAGNOSIS — I252 Old myocardial infarction: Secondary | ICD-10-CM

## 2020-06-12 MED ORDER — IRBESARTAN 150 MG PO TABS: 150.0000 mg | ORAL_TABLET | Freq: Every day | ORAL | 3 refills | Status: DC

## 2020-06-12 NOTE — Patient Instructions (Addendum)
Medication Instructions:  STOP LOSARTAN   START IRBESARTAN 150 MG DAILY   Labwork: NONE  Testing/Procedures: NONE  Follow-Up:  07/28/2020 AT 2:15 PM WITH DR [facility]

## 2020-06-12 NOTE — Progress Notes (Signed)
Hypertension Clinic Follow Up    Date:  06/12/2020   ID:  Karl Alvarez, DOB September 26, 1955, MRN [id number]  PCP:  Karl Alvarez., MD  Cardiologist:  Karl Furbish, MD   Referring MD: Karl Alvarez., MD   CC: Hypertension  History of Present Illness:    Karl Alvarez is a 64 y.o. male with a hx of CA s/p inferior STEMI (DES to RCA in 2015), hypertension, hyperlipidemia, DM, and chronic pain here to establish care in the hypertension clinic. He noted that his BP was more difficult to control recently.  He has been working with his PCP and Dr. Marlou Alvarez.  Carvedilol was increased to 12.5mg  and HCTZ was increased to 25mg .  Since then his BP was better-controlled.  After eating his BP goes up as high as the 200s.  His BP seems to better a couple hours after eating.  When his BP increases he has feel it pounding his neck.  Dr. Marlou Alvarez increased HCTZ to 25 on 4/15.    Karl Alvarez struggled with constipation and abdominal pain due to pain medications.  He has taken it chronically for decades. He has been trying to reduce the medication and struggles with chronic pain in his back and leg.  He broke his leg 08/2019.  He broke it by twisting his ankle and tripping over a door mat.  There are no preceding cardiac symptoms.  He started taking calcium and vitamin D due to concern for possible osteoporosis.  He switched from hydrocodone to oxycodone.  He did physical therapy for four visits but didn't feel like it was helpful. He noticed that when he took his muscle relaxer his BP was easier to control and it was easier for him to sleep.  This was discontinued on 3/26.  He doesn't get much exercise but works out in his yard.  He notes that his diet isn't as good as it used to.  He takes a lot of vitamins and supplements (calcium, Centrum multivitamin, B12, and iron) because he doesn't eat regular meals.  He snores occasionally and typically sleeps less than 4 hours/day.  He is unsure about apnea and does  not feel rested when he wakes up. He has orthostatic symptoms that worsened after increasing carvedilol.  At his last appointment his BP was still running low and he was dizzy.  HCTZ was discontinued.  He also noted that his blood pressure seem to be running high especially at night, sometimes even once sleeping.  Losartan was switched to 25mg  in the AM and 50mg  in th evening.  Since he stopped taking the HCTZ he no longer has the very low BPs in the 90s.  Typically it is running pretty normal.  However it seems to still run high in the evenings and early morning.  He continues to do his yard work.  He gets tired but otherwise feels well.  He thinks that he may have sleep apnea but now isn't a good time for him to test.    Previous antihypertensives: Ramipril HCTZ- dizziness   Past Medical History:  Diagnosis Date  . Anxiety   . CAD (coronary artery disease) 08/2014   Inferior STEMI with PCI/DES to RCA - well preserved LV function  . Colon polyps   . Diabetes ([place])   . DJD (degenerative joint disease)   . Gastric polyps   . History of BPH   . HLD (hyperlipidemia)   . HTN (hypertension)   . Internal hemorrhoids   .  Mitral regurgitation   . Osteoarthritis   . Overweight   . Salmonella gastroenteritis     Past Surgical History:  Procedure Laterality Date  . LEFT HEART CATHETERIZATION WITH CORONARY ANGIOGRAM N/A 08/14/2014   Procedure: LEFT HEART CATHETERIZATION WITH CORONARY ANGIOGRAM;  Surgeon: Wellington Hampshire, MD;  Location: [facility];  Service: Cardiovascular;  Laterality: N/A;    Current Medications: Current Meds  Medication Sig  . aspirin-sod bicarb-citric acid (ALKA-SELTZER) 325 MG TBEF tablet Take 325 mg by mouth at bedtime.  Marland Kitchen CALCIUM CITRATE PO Take by mouth.  . calcium-vitamin D 250-100 MG-UNIT tablet Take 1 tablet by mouth 2 (two) times daily.  . carvedilol (COREG) 12.5 MG tablet Take 12.5mg  in AM and 12.5mg  at supper  . colchicine 0.6 MG tablet Take by mouth.  .  ferrous sulfate 325 (65 FE) MG tablet Take 325 mg by mouth daily with breakfast.  . gabapentin (NEURONTIN) 300 MG capsule Take 300 mg by mouth 3 (three) times daily. Two tablets in the morning and 3 tablets in the evening.  . metFORMIN (GLUCOPHAGE) 500 MG tablet Take 500 mg by mouth 2 (two) times daily.   . multivitamin (ONE-A-DAY MEN'S) TABS tablet Take 1 tablet by mouth daily.  . nitroGLYCERIN (NITROSTAT) 0.4 MG SL tablet Place 1 tablet (0.4 mg total) under the tongue every 5 (five) minutes x 3 doses as needed for chest pain.  Marland Kitchen oxyCODONE-acetaminophen (PERCOCET/ROXICET) 5-325 MG tablet Take by mouth every 4 (four) hours as needed for severe pain.  . rosuvastatin (CRESTOR) 20 MG tablet Take 1 tablet (20 mg total) by mouth daily.  . vitamin B-12 (CYANOCOBALAMIN) 1000 MCG tablet Take 1,000 mcg by mouth 2 (two) times daily.   . [DISCONTINUED] losartan (COZAAR) 50 MG tablet Take 50 mg by mouth as directed. TAKE 1/2 IN THE AM AND 1 IN THE PM     Allergies:   Altace [ramipril]   Social History   Socioeconomic History  . Marital status: Married    Spouse name: Not on file  . Number of children: Not on file  . Years of education: Not on file  . Highest education level: Not on file  Occupational History  . Not on file  Tobacco Use  . Smoking status: Former Research scientist (life sciences)  . Smokeless tobacco: Current User    Types: Chew  Vaping Use  . Vaping Use: Never used  Substance and Sexual Activity  . Alcohol use: Not on file  . Drug use: Not on file  . Sexual activity: Not on file  Other Topics Concern  . Not on file  Social History Narrative  . Not on file   Social Determinants of Health   Financial Resource Strain: Low Risk   . Difficulty of Paying Living Expenses: Not hard at all  Food Insecurity: No Food Insecurity  . Worried About Charity fundraiser in the Last Year: Never true  . Ran Out of Food in the Last Year: Never true  Transportation Needs: No Transportation Needs  . Lack of  Transportation (Medical): No  . Lack of Transportation (Non-Medical): No  Physical Activity: Insufficiently Active  . Days of Exercise per Week: 7 days  . Minutes of Exercise per Session: 20 min  Stress: Stress Concern Present  . Feeling of Stress : To some extent  Social Connections:   . Frequency of Communication with Friends and Family: Not on file  . Frequency of Social Gatherings with Friends and Family: Not on file  .  Attends Religious Services: Not on file  . Active Member of Clubs or Organizations: Not on file  . Attends Archivist Meetings: Not on file  . Marital Status: Not on file     Family History: The patient's family history includes Hypertension in his brother and mother; Valvular heart disease in his mother.  ROS:   Please see the history of present illness.     All other systems reviewed and are negative.  EKGs/Labs/Other Studies Reviewed:    EKG:  EKG is not ordered today.  The ekg ordered today demonstrates   Recent Labs: No results found for requested labs within last 8760 hours.   Recent Lipid Panel    Component Value Date/Time   CHOL 165 08/15/2014 0019   TRIG 198 (H) 08/15/2014 0019   HDL 30 (L) 08/15/2014 0019   CHOLHDL 5.5 08/15/2014 0019   VLDL 40 08/15/2014 0019   LDLCALC 95 08/15/2014 0019    Physical Exam:    VS:  BP (!) 150/92   Pulse 83   Ht 5\' 9"  (1.753 m)   Wt 191 lb (86.6 kg)   SpO2 97%   BMI 28.21 kg/m  , BMI Body mass index is 28.21 kg/m. GENERAL:  Well appearing HEENT: Pupils equal round and reactive, fundi not visualized, oral mucosa unremarkable NECK:  No jugular venous distention, waveform within normal limits, carotid upstroke brisk and symmetric, no bruits LUNGS:  Clear to auscultation bilaterally HEART:  RRR.  PMI not displaced or sustained,S1 and S2 within normal limits, no S3, no S4, no clicks, no rubs, no murmurs ABD:  Flat, positive bowel sounds normal in frequency in pitch, no bruits, no rebound, no  guarding, no midline pulsatile mass, no hepatomegaly, no splenomegaly EXT:  2 plus pulses throughout, no edema, no cyanosis no clubbing SKIN:  No rashes no nodules NEURO:  Cranial nerves II through XII grossly intact, motor grossly intact throughout PSYCH:  Cognitively intact, oriented to person place and time   ASSESSMENT:    No diagnosis found.  PLAN:    # Essential hypertension: Karl Alvarez blood pressure is still not consistently controlled.  His BP is no longer low after stopping HCTZ but it is high in the evenings again.  We will stop losartan and switch to irbesartan 150 mg qhs.  OK to increase to 300 mg if needed.  BP goal <130/80.  He has no LE edema.  Renal artery Dopplers were negative 01/2020.  Continue to track BP twice daily in [place].  Continue carvedilol.   Secondary Causes of Hypertension  Medications/Herbal: OCP, steroids, stimulants, antidepressants, weight loss medication, immune suppressants, NSAIDs, sympathomimetics, alcohol, caffeine, licorice, ginseng, St. John's wort, chemo  Sleep Apnea: Declines sleep study at this time Renal artery stenosis: Normal Doppler 12/2019 Hyperaldosteronism: (testing not indicated)  Hyper/hypothyroidism: (testing not indicated)  Pheochromocytoma: (testing not indicated)  Cushing's syndrome: (testing not indicated)  Coarctation of the aorta: BP symmetric   Disposition:    FU with MD/PharmD in 6 weeks  Time spent: 25 minutes-Greater than 50% of this time was spent in counseling, explanation of diagnosis, planning of further management, and coordination of care.    Medication Adjustments/Labs and Tests Ordered: Current medicines are reviewed at length with the patient today.  Concerns regarding medicines are outlined above.  No orders of the defined types were placed in this encounter.  Meds ordered this encounter  Medications  . irbesartan (AVAPRO) 150 MG tablet    Sig: Take 1 tablet (150  mg total) by mouth daily.     Dispense:  90 tablet    Refill:  3    D/C LOSARTAN     Signed, Skeet Latch, MD  06/12/2020 5:18 PM    [facility]

## 2020-07-28 ENCOUNTER — Ambulatory Visit: Payer: Medicare Other | Admitting: Cardiovascular Disease

## 2020-07-28 ENCOUNTER — Encounter: Payer: Self-pay | Admitting: Cardiovascular Disease

## 2020-07-28 ENCOUNTER — Other Ambulatory Visit: Payer: Self-pay

## 2020-07-28 VITALS — BP 128/76 | HR 53 | Ht 69.0 in | Wt 189.0 lb

## 2020-07-28 DIAGNOSIS — I10 Essential (primary) hypertension: Secondary | ICD-10-CM | POA: Diagnosis not present

## 2020-07-28 DIAGNOSIS — E7800 Pure hypercholesterolemia, unspecified: Secondary | ICD-10-CM

## 2020-07-28 MED ORDER — LOSARTAN POTASSIUM 50 MG PO TABS: 50.0000 mg | ORAL_TABLET | Freq: Every day | ORAL | 3 refills | Status: DC

## 2020-07-28 MED ORDER — DOXAZOSIN MESYLATE 2 MG PO TABS: 2.0000 mg | ORAL_TABLET | Freq: Every day | ORAL | 3 refills | Status: DC

## 2020-07-28 NOTE — Patient Instructions (Addendum)
Medication Instructions:  STOP IRBESARTAN   START LOSARTAN 50 MG AT BEDTIME  START DOXAZOSIN 2 MG AT BEDTIME   Labwork: NONE  Testing/Procedures: NONE  Follow-Up: 08/26/2020 AT 2:00 PM WITH PHARM D   CALL THE OFFICE IN 1 WEEK WITH YOUR BLOOD PRESSURE READINGS [phone]

## 2020-07-28 NOTE — Progress Notes (Signed)
Hypertension Clinic Follow Up    Date:  07/28/2020   ID:  Karl Alvarez, DOB 09/14/1955, MRN [id number]  PCP:  Raina Mina., MD  Cardiologist:  Candee Furbish, MD   Referring MD: Raina Mina., MD   CC: Hypertension  History of Present Illness:    Karl Alvarez is a 64 y.o. male with a hx of CA s/p inferior STEMI (DES to RCA in 2015), hypertension, hyperlipidemia, DM, and chronic pain here to establish care in the hypertension clinic. He noted that his BP was more difficult to control recently.  He has been working with his PCP and Dr. Marlou Porch.  Carvedilol was increased to 12.5mg  and HCTZ was increased to 25mg .  Since then his BP was better-controlled.  After eating his BP goes up as high as the 200s.  His BP seems to better a couple hours after eating.  When his BP increases he has feel it pounding his neck.  Dr. Marlou Porch increased HCTZ to 25 on 4/15.    [name] struggled with constipation and abdominal pain due to pain medications.  He has taken it chronically for decades. He has been trying to reduce the medication and struggles with chronic pain in his back and leg.  He broke his leg 08/2019.  He broke it by twisting his ankle and tripping over a door mat.  There are no preceding cardiac symptoms.  He started taking calcium and vitamin D due to concern for possible osteoporosis.  He switched from hydrocodone to oxycodone.  He did physical therapy for four visits but didn't feel like it was helpful. He noticed that when he took his muscle relaxer his BP was easier to control and it was easier for him to sleep.  This was discontinued on 3/26.  He doesn't get much exercise but works out in his yard.  He notes that his diet isn't as good as it used to.  He takes a lot of vitamins and supplements (calcium, Centrum multivitamin, B12, and iron) because he doesn't eat regular meals.  He snores occasionally and typically sleeps less than 4 hours/day.  He is unsure about apnea and does  not feel rested when he wakes up. He has orthostatic symptoms that worsened after increasing carvedilol.  At his last appointment his BP was still running low and he was dizzy.  HCTZ was discontinued.  He also noted that his blood pressure seem to be running high especially at night, sometimes even once sleeping.  Losartan was switched to 25mg  in the AM and 50mg  in th evening.  Since he stopped taking the HCTZ he no longer has the very low BPs in the 90s.  Typically it is running pretty normal.  However it seems to still run high in the evenings and early morning.  He continues to do his yard work.  He gets tired but otherwise feels well.  He thinks that he may have sleep apnea but now isn't a good time for him to test.  At his last appointment his blood pressure remained poorly controlled so irbesartan was started in place of losartan.  He hasn't been using the Vivify app.  Since then he has emesis every other day.  He continues to note that his BP is higher and night and lower throughout the day.  He complains of nocturia.  He has been limiting sodium intake.  He notes a change in his BP based on his diet, especially potatoes and starchy food.  He is active but doesn't get much formal exercise.  He has a lot of work to do around his home.  He struggles with arthritis pain.  He gets dizzy when he stands up and it is worse in the dark.  Previous antihypertensives: Ramipril-cough HCTZ- dizziness Amlodipine HCTZ- BP too low at 12.5 and 25mg  Irbesartan-emesis  Past Medical History:  Diagnosis Date  . Anxiety   . CAD (coronary artery disease) 08/2014   Inferior STEMI with PCI/DES to RCA - well preserved LV function  . Colon polyps   . Diabetes ([place])   . DJD (degenerative joint disease)   . Gastric polyps   . History of BPH   . HLD (hyperlipidemia)   . HTN (hypertension)   . Internal hemorrhoids   . Mitral regurgitation   . Osteoarthritis   . Overweight   . Pure hypercholesterolemia 08/18/2014    . Salmonella gastroenteritis     Past Surgical History:  Procedure Laterality Date  . LEFT HEART CATHETERIZATION WITH CORONARY ANGIOGRAM N/A 08/14/2014   Procedure: LEFT HEART CATHETERIZATION WITH CORONARY ANGIOGRAM;  Surgeon: Wellington Hampshire, MD;  Location: [place];  Service: Cardiovascular;  Laterality: N/A;    Current Medications: Current Meds  Medication Sig  . aspirin-sod bicarb-citric acid (ALKA-SELTZER) 325 MG TBEF tablet Take 325 mg by mouth at bedtime.  Marland Kitchen CALCIUM CITRATE PO Take by mouth.  . calcium-vitamin D 250-100 MG-UNIT tablet Take 1 tablet by mouth 2 (two) times daily.  . carvedilol (COREG) 12.5 MG tablet Take 12.5mg  in AM and 12.5mg  at supper  . ferrous sulfate 325 (65 FE) MG tablet Take 325 mg by mouth daily with breakfast.  . gabapentin (NEURONTIN) 300 MG capsule Take 300 mg by mouth 3 (three) times daily. Two tablets in the morning and 3 tablets in the evening.  . metFORMIN (GLUCOPHAGE) 500 MG tablet Take 500 mg by mouth 2 (two) times daily.   . multivitamin (ONE-A-DAY MEN'S) TABS tablet Take 1 tablet by mouth daily.  . nitroGLYCERIN (NITROSTAT) 0.4 MG SL tablet Place 1 tablet (0.4 mg total) under the tongue every 5 (five) minutes x 3 doses as needed for chest pain.  Marland Kitchen oxyCODONE-acetaminophen (PERCOCET/ROXICET) 5-325 MG tablet Take by mouth every 4 (four) hours as needed for severe pain.  . rosuvastatin (CRESTOR) 20 MG tablet Take 1 tablet (20 mg total) by mouth daily.  . vitamin B-12 (CYANOCOBALAMIN) 1000 MCG tablet Take 1,000 mcg by mouth 2 (two) times daily.   . [DISCONTINUED] irbesartan (AVAPRO) 150 MG tablet Take 1 tablet (150 mg total) by mouth daily.     Allergies:   Altace [ramipril]   Social History   Socioeconomic History  . Marital status: Married    Spouse name: Not on file  . Number of children: Not on file  . Years of education: Not on file  . Highest education level: Not on file  Occupational History  . Not on file  Tobacco Use  .  Smoking status: Former Research scientist (life sciences)  . Smokeless tobacco: Current User    Types: Chew  Vaping Use  . Vaping Use: Never used  Substance and Sexual Activity  . Alcohol use: Not on file  . Drug use: Not on file  . Sexual activity: Not on file  Other Topics Concern  . Not on file  Social History Narrative  . Not on file   Social Determinants of Health   Financial Resource Strain: Low Risk   . Difficulty of Paying Living Expenses: Not  hard at all  Food Insecurity: No Food Insecurity  . Worried About Charity fundraiser in the Last Year: Never true  . Ran Out of Food in the Last Year: Never true  Transportation Needs: No Transportation Needs  . Lack of Transportation (Medical): No  . Lack of Transportation (Non-Medical): No  Physical Activity: Insufficiently Active  . Days of Exercise per Week: 7 days  . Minutes of Exercise per Session: 20 min  Stress: Stress Concern Present  . Feeling of Stress : To some extent  Social Connections:   . Frequency of Communication with Friends and Family: Not on file  . Frequency of Social Gatherings with Friends and Family: Not on file  . Attends Religious Services: Not on file  . Active Member of Clubs or Organizations: Not on file  . Attends Archivist Meetings: Not on file  . Marital Status: Not on file     Family History: The patient's family history includes Hypertension in his brother and mother; Valvular heart disease in his mother.  ROS:   Please see the history of present illness.     All other systems reviewed and are negative.  EKGs/Labs/Other Studies Reviewed:    EKG:  EKG is not ordered today.  The ekg ordered today demonstrates   Recent Labs: No results found for requested labs within last 8760 hours.   Recent Lipid Panel    Component Value Date/Time   CHOL 165 08/15/2014 0019   TRIG 198 (H) 08/15/2014 0019   HDL 30 (L) 08/15/2014 0019   CHOLHDL 5.5 08/15/2014 0019   VLDL 40 08/15/2014 0019   LDLCALC 95  08/15/2014 0019    Physical Exam:    VS:  BP 128/76   Pulse (!) 53   Ht 5\' 9"  (1.753 m)   Wt 189 lb (85.7 kg)   SpO2 98%   BMI 27.91 kg/m  , BMI Body mass index is 27.91 kg/m. GENERAL:  Well appearing HEENT: Pupils equal round and reactive, fundi not visualized, oral mucosa unremarkable NECK:  No jugular venous distention, waveform within normal limits, carotid upstroke brisk and symmetric, no bruits LUNGS:  Clear to auscultation bilaterally HEART:  RRR.  PMI not displaced or sustained,S1 and S2 within normal limits, no S3, no S4, no clicks, no rubs, no murmurs ABD:  Flat, positive bowel sounds normal in frequency in pitch, no bruits, no rebound, no guarding, no midline pulsatile mass, no hepatomegaly, no splenomegaly EXT:  2 plus pulses throughout, no edema, no cyanosis no clubbing SKIN:  No rashes no nodules NEURO:  Cranial nerves II through XII grossly intact, motor grossly intact throughout PSYCH:  Cognitively intact, oriented to person place and time   ASSESSMENT:    1. Essential hypertension, benign   2. Pure hypercholesterolemia     PLAN:    # Essential hypertension: Mr. Venuto blood pressure is still not consistently controlled.  BP is low in the day and high at night.  He did better on losartan than irbesartan.  Resume losartan 50mg  qhs and add doxazosin 2mg  qhs, which may also help his nocturia.  Continue carvedilol.  He is not using Vivify so we will un-enroll him.  Secondary Causes of Hypertension  Medications/Herbal: OCP, steroids, stimulants, antidepressants, weight loss medication, immune suppressants, NSAIDs, sympathomimetics, alcohol, caffeine, licorice, ginseng, St. John's wort, chemo  Sleep Apnea: Declines sleep study at this time Renal artery stenosis: Normal Doppler 12/2019 Hyperaldosteronism: (testing not indicated)  Hyper/hypothyroidism: (testing not indicated)  Pheochromocytoma: (  testing not indicated)  Cushing's syndrome: (testing not  indicated)  Coarctation of the aorta: BP symmetric  # Hyperlipidemia: Continue rosuvastatin.   Disposition:    FU with MD/PharmD in 6 weeks  Time spent: 40 minutes-Greater than 50% of this time was spent in counseling, explanation of diagnosis, planning of further management, and coordination of care.    Medication Adjustments/Labs and Tests Ordered: Current medicines are reviewed at length with the patient today.  Concerns regarding medicines are outlined above.  No orders of the defined types were placed in this encounter.  Meds ordered this encounter  Medications  . DISCONTD: losartan (COZAAR) 50 MG tablet    Sig: Take 1 tablet (50 mg total) by mouth daily.    Dispense:  90 tablet    Refill:  3    D/C IRBESARTAN  . DISCONTD: doxazosin (CARDURA) 2 MG tablet    Sig: Take 1 tablet (2 mg total) by mouth daily.    Dispense:  90 tablet    Refill:  3  . doxazosin (CARDURA) 2 MG tablet    Sig: Take 1 tablet (2 mg total) by mouth at bedtime.    Dispense:  90 tablet    Refill:  3    D/C PREVIOUS RX  . losartan (COZAAR) 50 MG tablet    Sig: Take 1 tablet (50 mg total) by mouth at bedtime.    Dispense:  90 tablet    Refill:  3    D/C IRBESARTAN AND PREVIOUS RX     Signed, Skeet Latch, MD  07/28/2020 5:57 PM    [place]

## 2020-08-20 ENCOUNTER — Other Ambulatory Visit: Payer: Self-pay | Admitting: Cardiology

## 2020-08-26 ENCOUNTER — Other Ambulatory Visit: Payer: Self-pay

## 2020-08-26 ENCOUNTER — Ambulatory Visit (INDEPENDENT_AMBULATORY_CARE_PROVIDER_SITE_OTHER): Payer: Medicare Other | Admitting: Pharmacist Clinician (PhC)/ Clinical Pharmacy Specialist

## 2020-08-26 VITALS — BP 146/92 | HR 76 | Resp 16 | Ht 69.0 in | Wt 187.4 lb

## 2020-08-26 DIAGNOSIS — I10 Essential (primary) hypertension: Secondary | ICD-10-CM | POA: Diagnosis not present

## 2020-08-26 MED ORDER — LOSARTAN POTASSIUM 50 MG PO TABS: 50.0000 mg | ORAL_TABLET | Freq: Two times a day (BID) | ORAL | 3 refills | Status: DC

## 2020-08-26 MED ORDER — DOXAZOSIN MESYLATE 2 MG PO TABS: 2.0000 mg | ORAL_TABLET | Freq: Every day | ORAL | 3 refills | Status: DC

## 2020-08-26 NOTE — Progress Notes (Signed)
08/27/2020 Karl Alvarez June 10, 1956 [id number]   HPI:  Karl Alvarez is a 64 y.o. male patient of Dr Oval Linsey, with a [place] below who presents today for hypertension clinic evaluation.  He has been seen multiple times in the past year and his pressure was controlled, and actually occasionally too low.  He reported that while daytime numbers looked good (or even low), he was still having issues with high readings in the evenings and early mornings.  At one point losartan was switched to irbesartan, but he associated that with emisis and was switched back to losartan.  Today he notes that the home readings have not decreased significantly since starting back on losartan 50 mg daily.  Patient has chronic pain issues associated with breaking his leg one year ago.  He had to have surgery for pins to be inserted and he notes that these cause quite a bit of pain.     Past Medical History: hyperlipidemia  - on rosuvastatin 2o mg  ASCVD Inferior STEMI w/DES to RCA 2015 - on carvedilol, losartan  DM2 9/21 A1c 7.2  Chronic pain From broken leg last Christmas     Blood Pressure Goal:  130/80  Current Medications: carvedilol 12.5 mg bid, doxazosin 2 mg qhs, losartan 50 mg qhs  Family Hx: hypertension in brother and mother, valvular heart disease in mother  Social Hx: former smoker, currently uses chewing tobacco; no alcohol  Diet: tries to limit sodium intake, but doesn't eat regular meals, instead takes multivitamin and supplements  Exercise: no regular exercise, limited by leg problems  Home BP readings: highest was 160/90 range, mostly in 150/85-90 range - no written data with him, from memory  Intolerances: Ramipril-cough; HCTZ- dizziness; Amlodipine;  HCTZ- BP too low at 12.5 and 25mg ;  Irbesartan-emesis  Labs: 9/21: Na 140,K 4.0, Glu 168, BUN 14, Scr 0.92 GFR 88  Wt Readings from Last 3 Encounters:  08/26/20 187 lb 6.4 oz (85 kg)  07/28/20 189 lb (85.7 kg)  06/12/20 191  lb (86.6 kg)   BP Readings from Last 3 Encounters:  08/26/20 (!) 146/92  07/28/20 128/76  06/12/20 (!) 150/92   Pulse Readings from Last 3 Encounters:  08/26/20 76  07/28/20 (!) 53  06/12/20 83    Current Outpatient Medications  Medication Sig Dispense Refill  . aspirin-sod bicarb-citric acid (ALKA-SELTZER) 325 MG TBEF tablet Take 325 mg by mouth at bedtime.    . calcium-vitamin D 250-100 MG-UNIT tablet Take 1 tablet by mouth 2 (two) times daily.    . carvedilol (COREG) 12.5 MG tablet Take 12.5mg  in AM and 12.5mg  at supper 60 tablet 3  . ferrous sulfate 325 (65 FE) MG tablet Take 325 mg by mouth daily with breakfast.    . gabapentin (NEURONTIN) 300 MG capsule Take 300 mg by mouth 3 (three) times daily. Two tablets in the morning and 3 tablets in the evening.    . metFORMIN (GLUCOPHAGE) 500 MG tablet Take 500 mg by mouth 2 (two) times daily.     . multivitamin (ONE-A-DAY MEN'S) TABS tablet Take 1 tablet by mouth daily.    . nitroGLYCERIN (NITROSTAT) 0.4 MG SL tablet DISSOLVE 1 TABLET UNDER TONGUE EVERY 5 MINUTES AS NEEDED FOR CHEST PAIN. IF NO RELIEF AFTER 3 DOSES CALL 911. 25 tablet 4  . oxyCODONE-acetaminophen (PERCOCET/ROXICET) 5-325 MG tablet Take by mouth every 4 (four) hours as needed for severe pain.    . rosuvastatin (CRESTOR) 20 MG tablet Take 1 tablet (  20 mg total) by mouth daily. 90 tablet 3  . vitamin B-12 (CYANOCOBALAMIN) 1000 MCG tablet Take 1,000 mcg by mouth 2 (two) times daily.     Marland Kitchen doxazosin (CARDURA) 2 MG tablet Take 1 tablet (2 mg total) by mouth at bedtime. 90 tablet 3  . losartan (COZAAR) 50 MG tablet Take 1 tablet (50 mg total) by mouth in the morning and at bedtime. 180 tablet 3   No current facility-administered medications for this visit.    Allergies  Allergen Reactions  . Altace [Ramipril] Cough    Past Medical History:  Diagnosis Date  . Anxiety   . CAD (coronary artery disease) 08/2014   Inferior STEMI with PCI/DES to RCA - well preserved LV  function  . Colon polyps   . Diabetes ([place])   . DJD (degenerative joint disease)   . Gastric polyps   . History of BPH   . HLD (hyperlipidemia)   . HTN (hypertension)   . Internal hemorrhoids   . Mitral regurgitation   . Osteoarthritis   . Overweight   . Pure hypercholesterolemia 08/18/2014  . Salmonella gastroenteritis     Blood pressure (!) 146/92, pulse 76, resp. rate 16, height 5\' 9"  (1.753 m), weight 187 lb 6.4 oz (85 kg), SpO2 96 %.  Essential hypertension, benign Patient with essential hypertension, still not well controlled.  Will increase losartan to 50 mg twice daily and ask that he record home BP readings more regularly. He should continue with all other medications.  He will need to get metabolic panel in 2 weeks, however he comments that he will wait until next visit with PCP or [place].  We will see him back in 6 weeks for follow up.     Tommy Medal PharmD CPP [facility] [address] [place], [place] [address] [phone]

## 2020-08-26 NOTE — Patient Instructions (Signed)
Return for a a follow up appointment February 1 at 2:00 pm  Go to the lab in 2 weeks to check kidney function  Check your blood pressure at home daily and keep record of the readings.  Take your BP meds as follows:  Increase losartan to 50 mg twice daily  Continue with all other medications  Bring all of your meds, your BP cuff and your record of home blood pressures to your next appointment.  Exercise as you're able, try to walk approximately 30 minutes per day.  Keep salt intake to a minimum, especially watch canned and prepared boxed foods.  Eat more fresh fruits and vegetables and fewer canned items.  Avoid eating in fast food restaurants.    HOW TO TAKE YOUR BLOOD PRESSURE: . Rest 5 minutes before taking your blood pressure. .  Don't smoke or drink caffeinated beverages for at least 30 minutes before. . Take your blood pressure before (not after) you eat. . Sit comfortably with your back supported and both feet on the floor (don't cross your legs). . Elevate your arm to heart level on a table or a desk. . Use the proper sized cuff. It should fit smoothly and snugly around your bare upper arm. There should be enough room to slip a fingertip under the cuff. The bottom edge of the cuff should be 1 inch above the crease of the elbow. . Ideally, take 3 measurements at one sitting and record the average.

## 2020-08-27 ENCOUNTER — Encounter: Payer: Self-pay | Admitting: Pharmacist Clinician (PhC)/ Clinical Pharmacy Specialist

## 2020-08-27 NOTE — Assessment & Plan Note (Addendum)
Patient with essential hypertension, still not well controlled.  Will increase losartan to 50 mg twice daily and ask that he record home BP readings more regularly. He should continue with all other medications.  He will need to get metabolic panel in 2 weeks, however he comments that he will wait until next visit with PCP or [place].  We will see him back in 6 weeks for follow up.

## 2020-09-30 MED ORDER — LOSARTAN POTASSIUM 50 MG PO TABS: 50.0000 mg | ORAL_TABLET | Freq: Two times a day (BID) | ORAL | 3 refills | Status: DC

## 2020-10-07 ENCOUNTER — Other Ambulatory Visit: Payer: Self-pay

## 2020-10-07 ENCOUNTER — Ambulatory Visit (INDEPENDENT_AMBULATORY_CARE_PROVIDER_SITE_OTHER): Payer: Medicare Other | Admitting: Pharmacist Clinician (PhC)/ Clinical Pharmacy Specialist

## 2020-10-07 DIAGNOSIS — I10 Essential (primary) hypertension: Secondary | ICD-10-CM | POA: Diagnosis not present

## 2020-10-07 DIAGNOSIS — E7800 Pure hypercholesterolemia, unspecified: Secondary | ICD-10-CM

## 2020-10-07 MED ORDER — HYDROCHLOROTHIAZIDE 12.5 MG PO CAPS: 12.5000 mg | ORAL_CAPSULE | Freq: Every day | ORAL | 3 refills | Status: DC

## 2020-10-07 NOTE — Patient Instructions (Signed)
Return for a a follow up appointment March 3 at 2 pm  Go to the lab today to check kidney function  Check your blood pressure at home daily and keep record of the readings.  Take your BP meds as follows:   Start hydrochlorothiazide 12.5 mg once daily.  You can take this either in the morning or at nght.  Continue with all other medications  If you see that your blood pressure drops too low (<100/60) or you have a lot of dizziness with the hydrochlorothiazide, please call to let us know.  Merdith Boyd/Raquel at [phone]  Try tapering off Alka-seltzer over the next month or so.  Try OTC Pepcid (famotidine) - it comes in a 10 mg tablet, try 10 mg twice daily.  Start while tapering off the Alka-Seltzer.  You can take up to 20 mg twice daily.  Bring all of your meds, your BP cuff and your record of home blood pressures to your next appointment.  Exercise as you're able, try to walk approximately 30 minutes per day.  Keep salt intake to a minimum, especially watch canned and prepared boxed foods.  Eat more fresh fruits and vegetables and fewer canned items.  Avoid eating in fast food restaurants.    HOW TO TAKE YOUR BLOOD PRESSURE: . Rest 5 minutes before taking your blood pressure. .  Don't smoke or drink caffeinated beverages for at least 30 minutes before. . Take your blood pressure before (not after) you eat. . Sit comfortably with your back supported and both feet on the floor (don't cross your legs). . Elevate your arm to heart level on a table or a desk. . Use the proper sized cuff. It should fit smoothly and snugly around your bare upper arm. There should be enough room to slip a fingertip under the cuff. The bottom edge of the cuff should be 1 inch above the crease of the elbow. . Ideally, take 3 measurements at one sitting and record the average.

## 2020-10-07 NOTE — Progress Notes (Unsigned)
10/08/2020 Karl GhaziJames D Puffenbarger Sep 20, 1955 [id number]   HPI:  Karl Alvarez is a 65 y.o. male patient of Dr Duke Salviaandolph, with a PMH below who presents today for hypertension clinic evaluation.  He has been seen multiple times in the past year and his pressure was controlled, and actually occasionally too low.  He reported that while daytime numbers looked good (or even low), he was still having issues with high readings in the evenings and early mornings.  At one point losartan was switched to irbesartan, but he associated that with emisis and was switched back to losartan.  Today he notes that the home readings have not decreased significantly since starting back on losartan 50 mg daily.  Patient has chronic pain issues associated with breaking his leg one year ago.  He had to have surgery for pins to be inserted and he notes that these cause quite a bit of pain.   At his last visit we increased the losartan to 50 mg twice daily.  Today he returns for follow up.  Has not had lab work done since his last visit, with dose increase of losartan.  Will get that drawn today while he is in the office.  He complains today of more issues related to heartburn/reflux.  In talking he notes that he takes up to 6 Alka-Seltzer tablets each day to help ease his symptoms.  Each tablet contains 325 mg aspirin.  We had a long discussion on the concerns of so much aspirin, including worsening hypertension.  He also believes that the rosuvastatin is increasing his blood pressure and would like to go back on the atorvastatin.     Still has a lot of nausea, no vomiting; associates some of this with eating eggs.  Has cut back and no vomiting,  Due to check DM next month - doesn't check sugars at home Cut metformin from 1 gm bid due to anemia, cut dose, added iron, better now Takes alka seltzer daily for acid reflux - states takes at least 1-3 times per day Switch back to atorva   Past Medical History: hyperlipidemia   - on rosuvastatin 2o mg  ASCVD Inferior STEMI w/DES to RCA 2015 - on carvedilol, losartan  DM2 9/21 A1c 7.2  Chronic pain From broken leg last Christmas     Blood Pressure Goal:  130/80  Current Medications: carvedilol 12.5 mg bid, doxazosin 2 mg qhs, losartan 50 mg bid  Family Hx: hypertension in brother and mother, valvular heart disease in mother  Social Hx: former smoker, currently uses chewing tobacco; no alcohol  Diet: tries to limit sodium intake, but doesn't eat regular meals, instead takes multivitamin and supplements  Exercise: no regular exercise, limited by leg problems  Home BP readings: highest was 160/90 range, mostly in 150/85-90 range - no written data with him, from memory Thinks highest was about 160/85; lowest 140/80; diastolic mostly 80's can only recall once > 90; systolic 140-160 with more towards 160  Intolerances: Ramipril-cough; HCTZ- dizziness; Amlodipine;  HCTZ- BP too low at 12.5 and 25mg ;  Irbesartan-emesis  Labs: 9/21: Na 140,K 4.0, Glu 168, BUN 14, Scr 0.92 GFR 88  Wt Readings from Last 3 Encounters:  10/07/20 188 lb (85.3 kg)  08/26/20 187 lb 6.4 oz (85 kg)  07/28/20 189 lb (85.7 kg)   BP Readings from Last 3 Encounters:  10/07/20 (!) 154/78  08/26/20 (!) 146/92  07/28/20 128/76   Pulse Readings from Last 3 Encounters:  10/07/20 72  08/26/20 76  07/28/20 (!) 53    Current Outpatient Medications  Medication Sig Dispense Refill  . ferrous sulfate 325 (65 FE) MG tablet Take 325 mg by mouth daily with breakfast.    . hydrochlorothiazide (MICROZIDE) 12.5 MG capsule Take 1 capsule (12.5 mg total) by mouth daily. 90 capsule 3  . metFORMIN (GLUCOPHAGE) 500 MG tablet Take 500 mg by mouth 2 (two) times daily.     . vitamin B-12 (CYANOCOBALAMIN) 1000 MCG tablet Take 1,000 mcg by mouth 2 (two) times daily.     Marland Kitchen aspirin-sod bicarb-citric acid (ALKA-SELTZER) 325 MG TBEF tablet Take 325 mg by mouth at bedtime.    . calcium-vitamin D 250-100 MG-UNIT  tablet Take 1 tablet by mouth 2 (two) times daily.    . carvedilol (COREG) 12.5 MG tablet Take 12.5mg  in AM and 12.5mg  at supper 60 tablet 3  . doxazosin (CARDURA) 2 MG tablet Take 1 tablet (2 mg total) by mouth at bedtime. 90 tablet 3  . gabapentin (NEURONTIN) 300 MG capsule Take 300 mg by mouth 3 (three) times daily. Two tablets in the morning and 3 tablets in the evening.    Marland Kitchen losartan (COZAAR) 50 MG tablet Take 1 tablet (50 mg total) by mouth in the morning and at bedtime. 60 tablet 3  . multivitamin (ONE-A-DAY MEN'S) TABS tablet Take 1 tablet by mouth daily.    . nitroGLYCERIN (NITROSTAT) 0.4 MG SL tablet DISSOLVE 1 TABLET UNDER TONGUE EVERY 5 MINUTES AS NEEDED FOR CHEST PAIN. IF NO RELIEF AFTER 3 DOSES CALL 911. 25 tablet 4  . oxyCODONE-acetaminophen (PERCOCET/ROXICET) 5-325 MG tablet Take by mouth every 4 (four) hours as needed for severe pain.    . rosuvastatin (CRESTOR) 20 MG tablet Take 1 tablet (20 mg total) by mouth daily. 90 tablet 3   No current facility-administered medications for this visit.    Allergies  Allergen Reactions  . Altace [Ramipril] Cough    Past Medical History:  Diagnosis Date  . Anxiety   . CAD (coronary artery disease) 08/2014   Inferior STEMI with PCI/DES to RCA - well preserved LV function  . Colon polyps   . Diabetes ([place])   . DJD (degenerative joint disease)   . Gastric polyps   . History of BPH   . HLD (hyperlipidemia)   . HTN (hypertension)   . Internal hemorrhoids   . Mitral regurgitation   . Osteoarthritis   . Overweight   . Pure hypercholesterolemia 08/18/2014  . Salmonella gastroenteritis     Blood pressure (!) 154/78, pulse 72, weight 188 lb (85.3 kg).  Essential hypertension, benign Patient with essential hypertension, currently not well controlled.  In the past he has believed that hctz caused him to be hypotensive, and hesitant to re-try.  Will have him restart with 12. 5 mg once daily.   Advised that he could experience some  dizziness in the first week, but should abate after that.  It was stressed that he should check home BP readings, especially if he feels any symptoms of hypotension.  Explained that if his BP drops significantly, we can decrease dose of another medication to compensate and keep his pressure WNL.  Patient agreeable to plan.  After a long discussion on gastric issues, patient will start famotidine 10 mg bid.  He will also start tapering off the Alka-Seltzer, so that hopefully he will be off in a month's time.  Explained he can go to famotidine 20 mg bid if needed, and that with  his long history, he may need to find something to deal with breakthrough distress, such as Maalox or Mylanta.  The decrease in aspirin should also help lower his BP.   We will see him back in one month for follow up.   Pure hypercholesterolemia Patient would prefer to go back to atorvastatin, as he believes rosuvastatin is tied to increase in BP.  Explained that this is rare, but he is sure this is part of his problem.  Would rather he be on atorvastatin than none at all, so will switch him back to atorvastatin 40 mg daily.    Tommy Medal PharmD CPP [facility] [address] [place],  [address] [phone]

## 2020-10-08 ENCOUNTER — Encounter: Payer: Self-pay | Admitting: Pharmacist Clinician (PhC)/ Clinical Pharmacy Specialist

## 2020-10-08 LAB — BASIC METABOLIC PANEL
BUN/Creatinine Ratio: 11 (ref 10–24)
BUN: 10 mg/dL (ref 8–27)
CO2: 26 mmol/L (ref 20–29)
Calcium: 9.7 mg/dL (ref 8.6–10.2)
Chloride: 98 mmol/L (ref 96–106)
Creatinine, Ser: 0.91 mg/dL (ref 0.76–1.27)
GFR calc Af Amer: 102 mL/min/{1.73_m2} (ref >59)
GFR calc non Af Amer: 88 mL/min/{1.73_m2} (ref >59)
Glucose: 323 mg/dL — ABNORMAL HIGH (ref 65–99)
Potassium: 4.6 mmol/L (ref 3.5–5.2)
Sodium: 139 mmol/L (ref 134–144)

## 2020-10-08 MED ORDER — ATORVASTATIN CALCIUM 40 MG PO TABS: 40.0000 mg | ORAL_TABLET | Freq: Every day | ORAL | 3 refills | Status: DC

## 2020-10-08 NOTE — Assessment & Plan Note (Addendum)
Patient with essential hypertension, currently not well controlled.  In the past he has believed that hctz caused him to be hypotensive, and hesitant to re-try.  Will have him restart with 12. 5 mg once daily.   Advised that he could experience some dizziness in the first week, but should abate after that.  It was stressed that he should check home BP readings, especially if he feels any symptoms of hypotension.  Explained that if his BP drops significantly, we can decrease dose of another medication to compensate and keep his pressure WNL.  Patient agreeable to plan.  After a long discussion on gastric issues, patient will start famotidine 10 mg bid.  He will also start tapering off the Alka-Seltzer, so that hopefully he will be off in a month's time.  Explained he can go to famotidine 20 mg bid if needed, and that with his long history, he may need to find something to deal with breakthrough distress, such as Maalox or Mylanta.  The decrease in aspirin should also help lower his BP.   We will see him back in one month for follow up.

## 2020-10-08 NOTE — Assessment & Plan Note (Signed)
Patient would prefer to go back to atorvastatin, as he believes rosuvastatin is tied to increase in BP.  Explained that this is rare, but he is sure this is part of his problem.  Would rather he be on atorvastatin than none at all, so will switch him back to atorvastatin 40 mg daily.

## 2020-11-01 ENCOUNTER — Other Ambulatory Visit: Payer: Self-pay | Admitting: Cardiology

## 2020-11-03 NOTE — Telephone Encounter (Signed)
This is Dr. Mason's pt.  °

## 2020-11-06 ENCOUNTER — Ambulatory Visit (INDEPENDENT_AMBULATORY_CARE_PROVIDER_SITE_OTHER): Payer: Medicare Other | Admitting: Pharmacist Clinician (PhC)/ Clinical Pharmacy Specialist

## 2020-11-06 ENCOUNTER — Other Ambulatory Visit: Payer: Self-pay

## 2020-11-06 DIAGNOSIS — I10 Essential (primary) hypertension: Secondary | ICD-10-CM

## 2020-11-06 NOTE — Patient Instructions (Addendum)
Return for a a follow up appointment - we will call you to schedule this  Check your blood pressure at home daily and keep record of the readings.  Take your BP meds as follows:  Take only 1/2 of the losartan tablet (25 mg instead of 50 mg) if your pressure is < 034 systolic.  If it is above 110 go ahead and take the whole tablet.    Bring all of your meds, your BP cuff and your record of home blood pressures to your next appointment.  Exercise as you're able, try to walk approximately 30 minutes per day.  Keep salt intake to a minimum, especially watch canned and prepared boxed foods.  Eat more fresh fruits and vegetables and fewer canned items.  Avoid eating in fast food restaurants.    HOW TO TAKE YOUR BLOOD PRESSURE: . Rest 5 minutes before taking your blood pressure. .  Don't smoke or drink caffeinated beverages for at least 30 minutes before. . Take your blood pressure before (not after) you eat. . Sit comfortably with your back supported and both feet on the floor (don't cross your legs). . Elevate your arm to heart level on a table or a desk. . Use the proper sized cuff. It should fit smoothly and snugly around your bare upper arm. There should be enough room to slip a fingertip under the cuff. The bottom edge of the cuff should be 1 inch above the crease of the elbow. . Ideally, take 3 measurements at one sitting and record the average.

## 2020-11-06 NOTE — Progress Notes (Signed)
11/10/2020 Karl Alvarez 1956-06-01 [id number]   HPI:  Karl Alvarez is a 65 y.o. male patient of Dr Karl Alvarez, with a [place] below who presents today for hypertension clinic evaluation.  He has been seen multiple times in the past year and his pressure was controlled, and actually occasionally too low.  He reported that while daytime numbers looked good (or even low), he was still having issues with high readings in the evenings and early mornings.  At one point losartan was switched to irbesartan, but he associated that with emisis and was switched back to losartan.  Today he notes that the home readings have not decreased significantly since starting back on losartan 50 mg daily.  Patient has chronic pain issues associated with breaking his leg one year ago.  He had to have surgery for pins to be inserted and he notes that these cause quite a bit of pain.   At a prior visit we increased the losartan to 50 mg twice daily.  Today he returns for follow up.  Since his last visit he has cut back some on the use of AlkaSeltzer.  We had discussed not only the high dose of aspirin this was giving him, but also close to 500 mg of sodium per tablet.  He has dropped from 6 tablets most days to 2-3 per day now.  He is also noting that for the past few days his pressure has been too low and he actually fell the other night when he got up to urinate.  Prior to these lows, he notes his highest pressure was 245 systolic.  His glucose levels have increased recently as well and is now checking his sugars daily.    Past Medical History: hyperlipidemia  - on rosuvastatin 2o mg  ASCVD Inferior STEMI w/DES to RCA 2015 - on carvedilol, losartan  DM2 9/21 A1c 7.2  Chronic pain From broken leg last Christmas     Blood Pressure Goal:  130/80  Current Medications: carvedilol 12.5 mg bid, doxazosin 2 mg qhs, losartan 50 mg bid  Family Hx: hypertension in brother and mother, valvular heart disease in  mother  Social Hx: former smoker, currently uses chewing tobacco; no alcohol  Diet: tries to limit sodium intake, but doesn't eat regular meals, instead takes multivitamin and supplements Mostly beef - ground; doesn't like chicken anymore; some bacon Vegetables - trouble with teeth, unable to chew, so veggies are all in soup - vegetabels are fresh to start;   Exercise: no regular exercise, limited by leg problems  Home BP readings: no home readings with him today  Intolerances: Ramipril-cough; HCTZ- dizziness; Amlodipine;  HCTZ- BP too low at 12.5 and 25mg ;  Irbesartan-emesis  Labs: 9/21: Na 140,K 4.0, Glu 168, BUN 14, Scr 0.92 GFR 88  Wt Readings from Last 3 Encounters:  11/06/20 187 lb 3.2 oz (84.9 kg)  10/07/20 188 lb (85.3 kg)  08/26/20 187 lb 6.4 oz (85 kg)   BP Readings from Last 3 Encounters:  11/06/20 110/70  10/07/20 (!) 154/78  08/26/20 (!) 146/92   Pulse Readings from Last 3 Encounters:  11/06/20 91  10/07/20 72  08/26/20 76    Current Outpatient Medications  Medication Sig Dispense Refill  . aspirin-sod bicarb-citric acid (ALKA-SELTZER) 325 MG TBEF tablet Take 325 mg by mouth at bedtime.    Marland Kitchen atorvastatin (LIPITOR) 40 MG tablet Take 1 tablet (40 mg total) by mouth daily. 90 tablet 3  . calcium-vitamin D 250-100 MG-UNIT  tablet Take 1 tablet by mouth 2 (two) times daily.    . carvedilol (COREG) 12.5 MG tablet TAKE 1 TABLET BY MOUTH  TWICE DAILY 180 tablet 2  . doxazosin (CARDURA) 2 MG tablet Take 1 tablet (2 mg total) by mouth at bedtime. 90 tablet 3  . ferrous sulfate 325 (65 FE) MG tablet Take 325 mg by mouth daily with breakfast.    . gabapentin (NEURONTIN) 300 MG capsule Take 300 mg by mouth 3 (three) times daily. Two tablets in the morning and 3 tablets in the evening.    . hydrochlorothiazide (MICROZIDE) 12.5 MG capsule Take 1 capsule (12.5 mg total) by mouth daily. 90 capsule 3  . losartan (COZAAR) 50 MG tablet Take 1 tablet (50 mg total) by mouth in the  morning and at bedtime. 60 tablet 3  . metFORMIN (GLUCOPHAGE) 500 MG tablet Take 500 mg by mouth 2 (two) times daily.     . multivitamin (ONE-A-DAY MEN'S) TABS tablet Take 1 tablet by mouth daily.    . nitroGLYCERIN (NITROSTAT) 0.4 MG SL tablet DISSOLVE 1 TABLET UNDER TONGUE EVERY 5 MINUTES AS NEEDED FOR CHEST PAIN. IF NO RELIEF AFTER 3 DOSES CALL 911. 25 tablet 4  . oxyCODONE-acetaminophen (PERCOCET/ROXICET) 5-325 MG tablet Take by mouth every 4 (four) hours as needed for severe pain.    . vitamin B-12 (CYANOCOBALAMIN) 1000 MCG tablet Take 1,000 mcg by mouth 2 (two) times daily.      No current facility-administered medications for this visit.    Allergies  Allergen Reactions  . Altace [Ramipril] Cough    Past Medical History:  Diagnosis Date  . Anxiety   . CAD (coronary artery disease) 08/2014   Inferior STEMI with PCI/DES to RCA - well preserved LV function  . Colon polyps   . Diabetes ([place])   . DJD (degenerative joint disease)   . Gastric polyps   . History of BPH   . HLD (hyperlipidemia)   . HTN (hypertension)   . Internal hemorrhoids   . Mitral regurgitation   . Osteoarthritis   . Overweight   . Pure hypercholesterolemia 08/18/2014  . Salmonella gastroenteritis     Blood pressure 110/70, pulse 91, resp. rate 16, height 5\' 9"  (1.753 m), weight 187 lb 3.2 oz (84.9 kg), SpO2 99 %.  Essential hypertension, benign Patient with essential hypertension, currently well controlled.  Because of some recent low readings, will have him cut the losartan dose to 25 mg on days when his systolic pressure is < 381.  Otherwise he should continue with all medications.  He has now been seen by [facility] 3 times so will have him follow up with Dr. Oval Alvarez or Karl Alvarez in 6-8 weeks.    Tommy Medal PharmD CPP [facility] [address] [place],  [address] [phone]

## 2020-11-10 NOTE — Assessment & Plan Note (Signed)
Patient with essential hypertension, currently well controlled.  Because of some recent low readings, will have him cut the losartan dose to 25 mg on days when his systolic pressure is < 778.  Otherwise he should continue with all medications.  He has now been seen by CV[facility] 3 times so will have him follow up with Dr. Oval Linsey or Coletta Memos in 6-8 weeks.

## 2020-12-27 ENCOUNTER — Other Ambulatory Visit: Payer: Self-pay | Admitting: Cardiology

## 2021-01-03 ENCOUNTER — Other Ambulatory Visit: Payer: Self-pay | Admitting: Cardiology

## 2021-01-27 ENCOUNTER — Other Ambulatory Visit: Payer: Self-pay | Admitting: Cardiology

## 2021-01-28 MED ORDER — ATORVASTATIN CALCIUM 40 MG PO TABS
40.0000 mg | ORAL_TABLET | Freq: Every day | ORAL | 1 refills | Status: DC
Start: 2021-01-28 — End: 2021-09-25

## 2021-03-26 ENCOUNTER — Other Ambulatory Visit: Payer: Self-pay

## 2021-03-26 ENCOUNTER — Encounter: Payer: Self-pay | Admitting: Cardiology

## 2021-03-26 ENCOUNTER — Ambulatory Visit: Payer: Medicare Other | Admitting: Cardiology

## 2021-03-26 VITALS — BP 120/80 | HR 74 | Ht 69.0 in | Wt 182.0 lb

## 2021-03-26 DIAGNOSIS — I2510 Atherosclerotic heart disease of native coronary artery without angina pectoris: Secondary | ICD-10-CM

## 2021-03-26 DIAGNOSIS — I2583 Coronary atherosclerosis due to lipid rich plaque: Secondary | ICD-10-CM

## 2021-03-26 DIAGNOSIS — I10 Essential (primary) hypertension: Secondary | ICD-10-CM | POA: Diagnosis not present

## 2021-03-26 NOTE — Progress Notes (Signed)
Cardiology Office Note:    Date:  03/26/2021   ID:  Karl Alvarez, DOB 1956-09-03, MRN [id number]  PCP:  Raina Mina., MD   [facility] Providers Cardiologist:  Candee Furbish, MD     Referring MD: Raina Mina., MD     History of Present Illness:    Karl Alvarez is a 65 y.o. male here for the follow-up of hypertension.  Has been seen many times previously by the hypertension clinic.  At times blood pressure actually too low.  Coronary artery disease status post STEMI with DES to RCA in 2015.  Has hypertension hyperlipidemia diabetes.  He states that his blood pressure spiked with his 3 COVID vaccinations.  After about 6 months it came back down to normal.  He is only taking carvedilol and losartan.  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  This is not returned. Back pain, leg.  He thinks his back pain is from his kidney cyst which is the size of a lemon.  Hemoglobin 12.6 hemoglobin A1c 6.7 from outside labs.  Past Medical History:  Diagnosis Date   Anxiety    CAD (coronary artery disease) 08/2014   Inferior STEMI with PCI/DES to RCA - well preserved LV function   Colon polyps    Diabetes ([place])    DJD (degenerative joint disease)    Gastric polyps    History of BPH    HLD (hyperlipidemia)    HTN (hypertension)    Internal hemorrhoids    Mitral regurgitation    Osteoarthritis    Overweight    Pure hypercholesterolemia 08/18/2014   Salmonella gastroenteritis     Past Surgical History:  Procedure Laterality Date   LEFT HEART CATHETERIZATION WITH CORONARY ANGIOGRAM N/A 08/14/2014   Procedure: LEFT HEART CATHETERIZATION WITH CORONARY ANGIOGRAM;  Surgeon: Wellington Hampshire, MD;  Location: [facility] CATH [facility];  Service: Cardiovascular;  Laterality: N/A;    Current Medications: Current Meds  Medication Sig   aspirin-sod bicarb-citric acid (ALKA-SELTZER) 325 MG TBEF tablet Take 325 mg by mouth at bedtime.    atorvastatin (LIPITOR) 40 MG tablet Take 1 tablet (40 mg total) by mouth daily.   carvedilol (COREG) 12.5 MG tablet TAKE 1 TABLET BY MOUTH  TWICE DAILY   ferrous sulfate 325 (65 FE) MG tablet Take 325 mg by mouth daily with breakfast.   gabapentin (NEURONTIN) 300 MG capsule Take 300 mg by mouth 3 (three) times daily. Two tablets in the morning and 3 tablets in the evening.   losartan (COZAAR) 50 MG tablet Take 50 mg by mouth daily.   metFORMIN (GLUCOPHAGE) 500 MG tablet Take 500 mg by mouth 2 (two) times daily.    multivitamin (ONE-A-DAY MEN'S) TABS tablet Take 1 tablet by mouth daily.   nitroGLYCERIN (NITROSTAT) 0.4 MG SL tablet DISSOLVE 1 TABLET UNDER TONGUE EVERY 5 MINUTES AS NEEDED FOR CHEST PAIN. IF NO RELIEF AFTER 3 DOSES CALL 911.   oxyCODONE-acetaminophen (PERCOCET/ROXICET) 5-325 MG tablet Take by mouth every 4 (four) hours as needed for severe pain.   vitamin B-12 (CYANOCOBALAMIN) 1000 MCG tablet Take 1,000 mcg by mouth 2 (two) times daily.      Allergies:   Altace [ramipril]   Social History   Socioeconomic History   Marital status: Married    Spouse name: Not on file   Number of children: Not on file   Years of education: Not on file   Highest education  level: Not on file  Occupational History   Not on file  Tobacco Use   Smoking status: Former   Smokeless tobacco: Current    Types: Chew  Vaping Use   Vaping Use: Never used  Substance and Sexual Activity   Alcohol use: Not on file   Drug use: Not on file   Sexual activity: Not on file  Other Topics Concern   Not on file  Social History Narrative   Not on file   Social Determinants of Health   Financial Resource Strain: Not on file  Food Insecurity: Not on file  Transportation Needs: Not on file  Physical Activity: Not on file  Stress: Not on file  Social Connections: Not on file     Family History: The patient's family history includes Hypertension in his brother and mother; Valvular heart disease in his  mother.  ROS:   Please see the history of present illness.     All other systems reviewed and are negative.  EKGs/Labs/Other Studies Reviewed:     EKG:  EKG is  ordered today.  The ekg ordered today demonstrates sinus rhythm 74 with no other abnormalities  Recent Labs: 10/07/2020: BUN 10; Creatinine, Ser 0.91; Potassium 4.6; Sodium 139  Recent Lipid Panel    Component Value Date/Time   CHOL 165 08/15/2014 0019   TRIG 198 (H) 08/15/2014 0019   HDL 30 (L) 08/15/2014 0019   CHOLHDL 5.5 08/15/2014 0019   VLDL 40 08/15/2014 0019   LDLCALC 95 08/15/2014 0019     Risk Assessment/Calculations:          Physical Exam:    VS:  BP 120/80 (BP Location: Left Arm, Patient Position: Sitting, Cuff Size: Normal)   Pulse 74   Ht 5\' 9"  (1.753 m)   Wt 182 lb (82.6 kg)   SpO2 97%   BMI 26.88 kg/m     Wt Readings from Last 3 Encounters:  03/26/21 182 lb (82.6 kg)  11/06/20 187 lb 3.2 oz (84.9 kg)  10/07/20 188 lb (85.3 kg)     GEN:  Well nourished, well developed in no acute distress HEENT: Normal NECK: No JVD; No carotid bruits LYMPHATICS: No lymphadenopathy CARDIAC: RRR, no murmurs, rubs, gallops RESPIRATORY:  Clear to auscultation without rales, wheezing or rhonchi  ABDOMEN: Soft, non-tender, non-distended MUSCULOSKELETAL:  No edema; No deformity  SKIN: Warm and dry NEUROLOGIC:  Alert and oriented x 3 PSYCHIATRIC:  Normal affect   ASSESSMENT:    1. Essential hypertension, benign   2. Coronary artery disease due to lipid rich plaque    PLAN:    In order of problems listed above:  Essential hypertension -Overall reasonable control.  Doing well with losartan and carvedilol.  No changes made.  We had some significant challenges previously controlling his blood pressure, quite labile.  Now it is under better control.  He believes that the COVID-vaccine increase his blood pressure and it took about 6 months to wear off.  Coronary artery disease status post MI - Currently  doing well.  He did have some episodes of left shoulder discomfort that reminded him of his MI.  Overall currently doing well.  No changes made.  Continue with goal-directed medical therapy including aspirin and Lipitor carvedilol Cozaar.  He is on atorvastatin 40 mg once a day.  Continue with current medical management.  Hyperlipidemia - Recent lab work performed at his outside source.  No changes made.       Medication Adjustments/Labs and Tests  Ordered: Current medicines are reviewed at length with the patient today.  Concerns regarding medicines are outlined above.  Orders Placed This Encounter  Procedures   EKG 12-Lead    No orders of the defined types were placed in this encounter.   Patient Instructions  Medication Instructions:  The current medical regimen is effective;  continue present plan and medications.  *If you need a refill on your cardiac medications before your next appointment, please call your pharmacy*  Follow-Up: At [facility], you and your health needs are our priority.  As part of our continuing mission to provide you with exceptional heart care, we have created designated Provider Care Teams.  These Care Teams include your primary Cardiologist (physician) and Advanced Practice Providers (APPs -  Physician Assistants and Nurse Practitioners) who all work together to provide you with the care you need, when you need it.  We recommend signing up for the patient portal called "MyChart".  Sign up information is provided on this After Visit Summary.  MyChart is used to connect with patients for Virtual Visits (Telemedicine).  Patients are able to view lab/test results, encounter notes, upcoming appointments, etc.  Non-urgent messages can be sent to your provider as well.   To learn more about what you can do with MyChart, go to [email or web address].    Your next appointment:   1 year(s)  The format for your next appointment:   In Person  Provider:    Candee Furbish, MD   Thank you for choosing [facility]!!     Signed, Candee Furbish, MD  03/26/2021 4:22 PM    [place]

## 2021-03-26 NOTE — Patient Instructions (Signed)
Medication Instructions:  The current medical regimen is effective;  continue present plan and medications.  *If you need a refill on your cardiac medications before your next appointment, please call your pharmacy*  Follow-Up: At [facility], you and your health needs are our priority.  As part of our continuing mission to provide you with exceptional heart care, we have created designated Provider Care Teams.  These Care Teams include your primary Cardiologist (physician) and Advanced Practice Providers (APPs -  Physician Assistants and Nurse Practitioners) who all work together to provide you with the care you need, when you need it.  We recommend signing up for the patient portal called "MyChart".  Sign up information is provided on this After Visit Summary.  MyChart is used to connect with patients for Virtual Visits (Telemedicine).  Patients are able to view lab/test results, encounter notes, upcoming appointments, etc.  Non-urgent messages can be sent to your provider as well.   To learn more about what you can do with MyChart, go to [email or web address].    Your next appointment:   1 year(s)  The format for your next appointment:   In Person  Provider:   Mark Skains, MD   Thank you for choosing [facility]!!    

## 2021-09-18 ENCOUNTER — Telehealth: Payer: Self-pay | Admitting: Cardiology

## 2021-09-18 ENCOUNTER — Ambulatory Visit (INDEPENDENT_AMBULATORY_CARE_PROVIDER_SITE_OTHER): Payer: Medicare Other

## 2021-09-18 DIAGNOSIS — R002 Palpitations: Secondary | ICD-10-CM

## 2021-09-18 NOTE — Telephone Encounter (Signed)
Pt sent message to scheduling requesting an appt for an irregular heart beat. I asked him the question's associated with the dot phrase for palpitations. His response was:   "Back in October 2022 I began talking Meloxicam 15 mg once daily prescribed be Orthopedic hand specialist Dr Hurshel Keys at [place]. I think it began my problem. My heart began beating differently. 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 The Meloxicam helped tremendously but I eventually figured out my heart problems were coming from the Meloxicam which I stopped taking  about December 6. I think my heart is  returning to normal but I still have a few days a week that when I go to bed I can feel it  not being normal. My blood pressure is good now but it was a little high back when I was taking the Meloxicam and a few weeks after.  I wont be taking the Meloxicam anymore or anything like it. I would like to have my heart checked out to make sure Im ok for surgery on my thumb in the near future. Also my mother is 15 and has been dealing with Afib for about the last 10 years. Thanks"

## 2021-09-18 NOTE — Telephone Encounter (Signed)
ZIO XT- Long Term Monitor Instructions  Your physician has requested you wear a ZIO patch monitor for 14 days.  This is a single patch monitor. [employer] supplies one patch monitor per enrollment. Additional stickers are not available. Please do not apply patch if you will be having a Nuclear Stress Test,  Echocardiogram, Cardiac CT, MRI, or Chest Xray during the period you would be wearing the  monitor. The patch cannot be worn during these tests. You cannot remove and re-apply the  ZIO XT patch monitor.  Your ZIO patch monitor will be mailed 3 day USPS to your address on file. It may take 3-5 days  to receive your monitor after you have been enrolled.  Once you have received your monitor, please review the enclosed instructions. Your monitor  has already been registered assigning a specific monitor serial # to you.  Billing and Patient Assistance Program Information  We have supplied [employer] with any of your insurance information on file for billing purposes. [employer] offers a sliding scale Patient Assistance Program for patients that do not have  insurance, or whose insurance does not completely cover the cost of the ZIO monitor.  You must apply for the Patient Assistance Program to qualify for this discounted rate.  To apply, please call [employer] at [phone], select option 4, select option 2, ask to apply for  Patient Assistance Program. Theodore Demark will ask your household income, and how many people  are in your household. They will quote your out-of-pocket cost based on that information.  [employer] will also be able to set up a 58-month, interest-free payment plan if needed.  Applying the monitor   Shave hair from upper left chest.  Hold abrader disc by orange tab. Rub abrader in 40 strokes over the upper left chest as  indicated in your monitor instructions.  Clean area with 4 enclosed alcohol pads. Let dry.  Apply patch as indicated in monitor instructions. Patch will be placed under  collarbone on left  side of chest with arrow pointing upward.  Rub patch adhesive wings for 2 minutes. Remove white label marked "1". Remove the white  label marked "2". Rub patch adhesive wings for 2 additional minutes.  While looking in a mirror, press and release button in center of patch. A small green light will  flash 3-4 times. This will be your only indicator that the monitor has been turned on.  Do not shower for the first 24 hours. You may shower after the first 24 hours.  Press the button if you feel a symptom. You will hear a small click. Record Date, Time and  Symptom in the Patient Logbook.  When you are ready to remove the patch, follow instructions on the last 2 pages of Patient  Logbook. Stick patch monitor onto the last page of Patient Logbook.  Place Patient Logbook in the blue and white box. Use locking tab on box and tape box closed  securely. The blue and white box has prepaid postage on it. Please place it in the mailbox as  soon as possible. Your physician should have your test results approximately 7 days after the  monitor has been mailed back to [facility].  Call [facility] at [phone] if you have questions regarding  your ZIO XT patch monitor. Call them immediately if you see an orange light blinking on your  monitor.  If your monitor falls off in less than 4 days, contact our Monitor department at [phone].  If your  monitor becomes loose or falls off after 4 days call [employer] at [phone] for  suggestions on securing your monitor   Pt is aware zio monitor has been ordered by Dr Marlou Porch.  Reviewed instructions and home address with pt.  He will look for it in the mail.  He will c/b if any questions/concerns.

## 2021-09-18 NOTE — Telephone Encounter (Signed)
Given his palpitations, irregular heartbeat, lets go ahead and proceed with a Zio patch monitor, 14 days for further evaluation.  Thank you Candee Furbish, MD

## 2021-09-18 NOTE — Telephone Encounter (Signed)
Spoke with pt who is reporting having had an irregular heart beat off and for some time time after taking Meloxicam. He has since discontinued  this and irregular HB has gotten some better.  At times his BP machine will tell him he has an irregular heartbeat.  He can feel it when he lays down on occasion.  Yesterday he went for a walk in the woods. He typically does does but thad not done it for a good while.  During his walk he reports feeling lightheadedness (but not dizzy) once he got back home and rested the lightheadedness resolved.  He denies any SOB, CP not shoulder or neck pain like with his prior MI.  He has a HX of CAD, HTN, HLD, MR and DM but no HX of At Fib or any other arrhthymias.  His mother does have At Fib. He believes his irregular HB has improved since discontinuing the Meloxicam but would like to know what he needs to do now.  He does not feel the irregular HB daily. Advised  I will forward to Dr Marlou Porch for review and c/b with further instructions/orders.

## 2021-09-18 NOTE — Progress Notes (Unsigned)
Enrolled patient for a 14 day Zio XT  monitor to be mailed to patients home  °

## 2021-09-20 DIAGNOSIS — R002 Palpitations: Secondary | ICD-10-CM | POA: Diagnosis not present

## 2021-09-25 ENCOUNTER — Other Ambulatory Visit: Payer: Self-pay | Admitting: Cardiovascular Disease

## 2021-10-16 ENCOUNTER — Telehealth: Payer: Self-pay

## 2021-10-16 MED ORDER — CARVEDILOL 25 MG PO TABS: 25.0000 mg | ORAL_TABLET | Freq: Two times a day (BID) | ORAL | 3 refills | Status: DC

## 2021-10-16 NOTE — Telephone Encounter (Signed)
The patient has been notified of the result and verbalized understanding.  All questions (if any) were answered.  OV scheduled for 12/16/21 with ML.  Precious Gilding, RN 10/16/2021 5:26 PM   Pt wants to know if it's okay to take prednisone that was recently prescribed.  Pt does not know dosage.  Will send a my chart message with exact information for pharmacist to review.

## 2021-10-16 NOTE — Telephone Encounter (Signed)
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  7.3%.  Lets try increasing carvedilol from 12.5 mg twice a day to 25 mg twice a day to see if this helps suppress. Lets have follow-up with APP in 2 months. Candee Furbish, MD

## 2021-10-20 ENCOUNTER — Encounter: Payer: Self-pay | Admitting: Cardiology

## 2021-12-07 NOTE — Progress Notes (Signed)
? ?Cardiology Office Note   ? ?Date:  12/16/2021  ? ?ID:  Karl Alvarez, DOB 1956-07-07, MRN [id number] ? ? ?PCP:  Raina Mina., MD ?  ?Scraper  ?Cardiologist:  Candee Furbish, MD   ?Advanced Practice Provider:  No care team member to display ?Electrophysiologist:  None  ? ?[id number]}  ? ?Chief Complaint  ?Patient presents with  ? Palpitations  ? ? ?History of Present Illness:  ?Karl Alvarez is a 66 y.o. male with history of Coronary artery disease status post STEMI with DES to RCA in 2015.  Has hypertension, hyperlipidemia, diabetes. ?  ?Patient called in with palpitations 09/2021 and Zio showed PVC's 7.3% Dr. Marlou Porch increased his carvedilol 25 mg bid. ? ?Patient comes in for f/u. When he lays down to go to sleep he feels his heart beating which he hadn't felt before. Feels different. It has gotten a little better on increased carvedilol. He had some chest tightness with walking eased with rest. Happened multiple times but has stopped since he increased carvedilol. He thinks meloxicam caused the problem. Thinks he may need thumb joint replacement. BP up/down throughout the day. He has gotten some extra salt in his diet recently. Has lost 15 lbs intentionally by monitoring carbs.  ? ? ?Past Medical History:  ?Diagnosis Date  ? Anxiety   ? CAD (coronary artery disease) 08/2014  ? Inferior STEMI with PCI/DES to RCA - well preserved LV function  ? Colon polyps   ? Diabetes ([place])   ? DJD (degenerative joint disease)   ? Gastric polyps   ? History of BPH   ? HLD (hyperlipidemia)   ? HTN (hypertension)   ? Internal hemorrhoids   ? Mitral regurgitation   ? Osteoarthritis   ? Overweight   ? Pure hypercholesterolemia 08/18/2014  ? Salmonella gastroenteritis   ? ? ?Past Surgical History:  ?Procedure Laterality Date  ? LEFT HEART CATHETERIZATION WITH CORONARY ANGIOGRAM N/A 08/14/2014  ? Procedure: LEFT HEART CATHETERIZATION WITH CORONARY ANGIOGRAM;  Surgeon: Wellington Hampshire, MD;  Location:  [facility];  Service: Cardiovascular;  Laterality: N/A;  ? ? ?Current Medications: ?Current Meds  ?Medication Sig  ? aspirin-sod bicarb-citric acid (ALKA-SELTZER) 325 MG TBEF tablet Take 325 mg by mouth at bedtime.  ? atorvastatin (LIPITOR) 40 MG tablet TAKE 1 TABLET BY MOUTH  DAILY  ? carvedilol (COREG) 25 MG tablet Take 1 tablet (25 mg total) by mouth 2 (two) times daily.  ? ferrous sulfate 325 (65 FE) MG tablet Take 325 mg by mouth daily with breakfast.  ? gabapentin (NEURONTIN) 300 MG capsule Take 300 mg by mouth 3 (three) times daily. Two tablets in the morning and 3 tablets in the evening and one tablet at bedtime.  ? losartan (COZAAR) 50 MG tablet Take 25 mg by mouth daily. One half tablet by mouth ( 25 mg) daily.  ? metFORMIN (GLUCOPHAGE) 500 MG tablet Take 500 mg by mouth 2 (two) times daily.   ? multivitamin (ONE-A-DAY MEN'S) TABS tablet Take 1 tablet by mouth daily.  ? nitroGLYCERIN (NITROSTAT) 0.4 MG SL tablet DISSOLVE 1 TABLET UNDER TONGUE EVERY 5 MINUTES AS NEEDED FOR CHEST PAIN. IF NO RELIEF AFTER 3 DOSES CALL 911.  ? oxyCODONE-acetaminophen (PERCOCET/ROXICET) 5-325 MG tablet Take by mouth every 4 (four) hours as needed for severe pain.  ? vitamin B-12 (CYANOCOBALAMIN) 1000 MCG tablet Take 1,000 mcg by mouth 2 (two) times daily.   ?  ? ?Allergies:   Altace [  ramipril]  ? ?Social History  ? ?Socioeconomic History  ? Marital status: Married  ?  Spouse name: Not on file  ? Number of children: Not on file  ? Years of education: Not on file  ? Highest education level: Not on file  ?Occupational History  ? Not on file  ?Tobacco Use  ? Smoking status: Former  ? Smokeless tobacco: Current  ?  Types: Chew  ?Vaping Use  ? Vaping Use: Never used  ?Substance and Sexual Activity  ? Alcohol use: Not on file  ? Drug use: Not on file  ? Sexual activity: Not on file  ?Other Topics Concern  ? Not on file  ?Social History Narrative  ? Not on file  ? ?Social Determinants of Health  ? ?Financial Resource Strain: Not on  file  ?Food Insecurity: Not on file  ?Transportation Needs: Not on file  ?Physical Activity: Not on file  ?Stress: Not on file  ?Social Connections: Not on file  ?  ? ?Family History:  The patient's  family history includes Hypertension in his brother and mother; Valvular heart disease in his mother.  ? ?ROS:   ?Please see the history of present illness.    ?ROS All other systems reviewed and are negative. ? ? ?PHYSICAL EXAM:   ?VS:  BP (!) 146/68 (BP Location: Right Arm, Patient Position: Sitting, Cuff Size: Normal)   Pulse 76   Ht '5\' 8"'$  (1.727 m)   Wt 179 lb (81.2 kg)   SpO2 98%   BMI 27.22 kg/m?   ?Physical Exam  ?GEN: Well nourished, well developed, in no acute distress  ?Neck: no JVD, carotid bruits, or masses ?Cardiac:RRR; no murmurs, rubs, or gallops  ?Respiratory:  clear to auscultation bilaterally, normal work of breathing ?GI: soft, nontender, nondistended, + BS ?Ext: without cyanosis, clubbing, or edema, Good distal pulses bilaterally ?Neuro:  Alert and Oriented x 3,  ?Psych: euthymic mood, full affect ? ?Wt Readings from Last 3 Encounters:  ?12/16/21 179 lb (81.2 kg)  ?03/26/21 182 lb (82.6 kg)  ?11/06/20 187 lb 3.2 oz (84.9 kg)  ?  ? ? ?Studies/Labs Reviewed:  ? ?EKG:  EKG is not ordered today.   ? ?Recent Labs: ?No results found for requested labs within last 8760 hours.  ? ?Lipid Panel ?   ?Component Value Date/Time  ? CHOL 165 08/15/2014 0019  ? TRIG 198 (H) 08/15/2014 0019  ? HDL 30 (L) 08/15/2014 0019  ? CHOLHDL 5.5 08/15/2014 0019  ? VLDL 40 08/15/2014 0019  ? [place] 95 08/15/2014 0019  ? ? ?Additional studies/ records that were reviewed today include:  ?Cath 2015 ?Coronary angiography: ?Coronary dominance: Right ?   ?Left Main:  Normal ?Left Anterior Descending (LAD):  Normal in size with 20% mid stenosis. ?1st diagonal (D1):  Large in size with minor irregularities. ?2nd diagonal (D2):  Very small in size. ?3rd diagonal (D3):  Very small in size. ?Circumflex (LCx):  Normal in size and  nondominant. The vessel has minor irregularities. ?1st obtuse marginal:  Normal in size with no significant disease. ?2nd obtuse marginal:  Normal in size with no significant disease. ?3rd obtuse marginal:  Small in size with no significant disease.  ?   ?Right Coronary Artery: Large in size and dominant. The vessel is occluded in the midsegment with TIMI 0 flow. There is 30% disease in the distal segment. Marland Kitchen   ?Posterior descending artery: Medium in size with minor irregularities. ? AV segment: Normal. Normal posterolateral branches.   ?  ?  Left ventriculography: Left ventricular systolic function is normal , LVEF is estimated at 55 %, there is no significant mitral regurgitation . Mild basal inferior wall hypokinesis  ?  ?PCI Note:  Following the diagnostic procedure, the decision was made to proceed with PCI.  Weight-based bivalirudin was given for anticoagulation. Once a therapeutic ACT was achieved, a 6 [place] and JR4de catheter was inserted.  A run through coronary guidewire was used to cross the lesion.  The lesion was predilated with a 2.5 x 12 balloon.  The lesion was then stented with a 3.0 x 28 mm Promus drug-eluting stent 3.25 x 20 mm.  The stent was postdilated with a 3.25 x 20 mm noncompliant balloon.  Following PCI, there was 0% residual stenosis and TIMI-3 flow. Final angiography confirmed an excellent result. The patient tolerated the procedure well. There were no immediate procedural complications. A TR band was used for radial hemostasis. The patient was transferred to the post catheterization recovery area for further monitoring. ?  ?PCI Data: ?Vessel - RCA: Segment  - mid ?Percent Stenosis (pre)  100% ?TIMI-flow 0 ?Stent : 3.0 X 28 mm Promus DES ?Percent Stenosis (post) : 0% ?TIMI-flow (post) : 3 ?  ?  ?Final Conclusions:  ?1. 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 No obstructive disease otherwise. ?2. Normal LV systolic  function with mild to moderate elevation of left ventricular end-diastolic pressure.   ?3. Successful angioplasty and drug-eluting stent placement to the mid right coronary artery.  ?  ?Recommendations:   ? the patie

## 2021-12-16 ENCOUNTER — Ambulatory Visit: Payer: Medicare Other | Admitting: Physician Assistant

## 2021-12-16 ENCOUNTER — Encounter: Payer: Self-pay | Admitting: Physician Assistant

## 2021-12-16 VITALS — BP 146/68 | HR 76 | Ht 68.0 in | Wt 179.0 lb

## 2021-12-16 DIAGNOSIS — R002 Palpitations: Secondary | ICD-10-CM

## 2021-12-16 DIAGNOSIS — R072 Precordial pain: Secondary | ICD-10-CM

## 2021-12-16 DIAGNOSIS — E7800 Pure hypercholesterolemia, unspecified: Secondary | ICD-10-CM

## 2021-12-16 DIAGNOSIS — I2510 Atherosclerotic heart disease of native coronary artery without angina pectoris: Secondary | ICD-10-CM | POA: Diagnosis not present

## 2021-12-16 DIAGNOSIS — I10 Essential (primary) hypertension: Secondary | ICD-10-CM

## 2021-12-16 MED ORDER — LOSARTAN POTASSIUM 50 MG PO TABS: ORAL_TABLET | ORAL | 3 refills | Status: DC

## 2021-12-16 NOTE — Patient Instructions (Signed)
Medication Instructions:  ?Your physician recommends that you continue on your current medications as directed. Please refer to the Current Medication list given to you today. ? ?*If you need a refill on your cardiac medications before your next appointment, please call your pharmacy* ? ? ?Lab Work: ?None ordered  ? ?If you have labs (blood work) drawn today and your tests are completely normal, you will receive your results only by: ?MyChart Message (if you have MyChart) OR ?A paper copy in the mail ?If you have any lab test that is abnormal or we need to change your treatment, we will call you to review the results. ? ? ?Testing/Procedures: ? ?Your physician has requested that you have a lexiscan myoview. For further information please visit [email or web address]. Please follow instruction sheet, BELOW: ? ? ?You are scheduled for a Myocardial Perfusion Imaging Study. ?Please arrive 15 minutes prior to your appointment time for registration and insurance purposes. ? ?The test will take approximately 3 to 4 hours to complete; you may bring reading material.  If someone comes with you to your appointment, they will need to remain in the main lobby due to limited space in the testing area. **If you are pregnant or breastfeeding, please notify the nuclear lab prior to your appointment** ? ?How to prepare for your Myocardial Perfusion Test: ?Do not eat or drink 3 hours prior to your test, except you may have water. ?Do not consume products containing caffeine (regular or decaffeinated) 12 hours prior to your test. (ex: coffee, chocolate, sodas, tea). ?Do bring a list of your current medications with you.  If not listed below, you may take your medications as normal. ?Do wear comfortable clothes (no dresses or overalls) and walking shoes, tennis shoes preferred (No heels or open toe shoes are allowed). ?Do NOT wear cologne, perfume, aftershave, or lotions (deodorant is allowed). ?If these instructions are not followed,  your test will have to be rescheduled. ? ?. ? ?Follow-Up: ?At [facility], you and your health needs are our priority.  As part of our continuing mission to provide you with exceptional heart care, we have created designated Provider Care Teams.  These Care Teams include your primary Cardiologist (physician) and Advanced Practice Providers (APPs -  Physician Assistants and Nurse Practitioners) who all work together to provide you with the care you need, when you need it. ? ?We recommend signing up for the patient portal called "MyChart".  Sign up information is provided on this After Visit Summary.  MyChart is used to connect with patients for Virtual Visits (Telemedicine).  Patients are able to view lab/test results, encounter notes, upcoming appointments, etc.  Non-urgent messages can be sent to your provider as well.   ?To learn more about what you can do with MyChart, go to [email or web address].   ? ?Your next appointment:   ?3 month(s) ? ?The format for your next appointment:   ?In Person ? ?Provider:   ?Candee Furbish, MD   ? ? ?Other Instructions ? ? ?Important Information About Sugar ? ? ? ? ?  ?

## 2021-12-24 ENCOUNTER — Telehealth (HOSPITAL_COMMUNITY): Payer: Self-pay | Admitting: *Deleted

## 2021-12-24 NOTE — Telephone Encounter (Signed)
Patient given detailed instructions per Myocardial Perfusion Study Information Sheet for the test on 12/30/21 Patient notified to arrive 15 minutes early and that it is imperative to arrive on time for appointment to keep from having the test rescheduled. ? If you need to cancel or reschedule your appointment, please call the office within 24 hours of your appointment. . Patient verbalized understanding. Karl Alvarez [name] ? ? ?

## 2021-12-30 ENCOUNTER — Ambulatory Visit (HOSPITAL_COMMUNITY): Payer: Medicare Other | Attending: Cardiology

## 2021-12-30 DIAGNOSIS — E7800 Pure hypercholesterolemia, unspecified: Secondary | ICD-10-CM | POA: Diagnosis not present

## 2021-12-30 DIAGNOSIS — I2510 Atherosclerotic heart disease of native coronary artery without angina pectoris: Secondary | ICD-10-CM | POA: Insufficient documentation

## 2021-12-30 DIAGNOSIS — R002 Palpitations: Secondary | ICD-10-CM | POA: Diagnosis not present

## 2021-12-30 DIAGNOSIS — R072 Precordial pain: Secondary | ICD-10-CM | POA: Diagnosis present

## 2021-12-30 DIAGNOSIS — I10 Essential (primary) hypertension: Secondary | ICD-10-CM | POA: Insufficient documentation

## 2021-12-30 LAB — MYOCARDIAL PERFUSION IMAGING
Angina Index: 0
Base ST Depression (mm): 0 mm
Duke Treadmill Score: 6
Estimated workload: 7
Exercise duration (min): 6 min
Exercise duration (sec): 5 s
LV dias vol: 83 mL (ref 62–150)
LV sys vol: 41 mL
MPHR: 154 {beats}/min
Nuc Stress EF: 51 %
Peak HR: 141 {beats}/min
Percent HR: 91 %
Rest HR: 70 {beats}/min
Rest Nuclear Isotope Dose: 10.9 mCi
SDS: 3
SRS: 4
SSS: 7
ST Depression (mm): 0 mm
Stress Nuclear Isotope Dose: 33 mCi
TID: 0.91

## 2021-12-30 MED ORDER — TECHNETIUM TC 99M TETROFOSMIN IV KIT
33.0000 | PACK | Freq: Once | INTRAVENOUS | Status: AC | PRN
  Administered 2021-12-30: 33 via INTRAVENOUS
  Filled 2021-12-30: qty 33

## 2021-12-30 MED ORDER — TECHNETIUM TC 99M TETROFOSMIN IV KIT
1030.0000 | PACK | Freq: Once | INTRAVENOUS | Status: AC | PRN
  Administered 2021-12-30: 1030 via INTRAVENOUS
  Filled 2021-12-30: qty 1030

## 2022-01-05 ENCOUNTER — Other Ambulatory Visit: Payer: Self-pay

## 2022-01-05 DIAGNOSIS — I2510 Atherosclerotic heart disease of native coronary artery without angina pectoris: Secondary | ICD-10-CM

## 2022-01-05 DIAGNOSIS — R002 Palpitations: Secondary | ICD-10-CM

## 2022-01-14 ENCOUNTER — Ambulatory Visit (HOSPITAL_COMMUNITY): Payer: Medicare Other | Attending: Cardiology

## 2022-01-14 DIAGNOSIS — I2510 Atherosclerotic heart disease of native coronary artery without angina pectoris: Secondary | ICD-10-CM | POA: Diagnosis present

## 2022-01-14 DIAGNOSIS — R002 Palpitations: Secondary | ICD-10-CM | POA: Insufficient documentation

## 2022-01-14 LAB — ECHOCARDIOGRAM COMPLETE
Area-P 1/2: 3.26 cm2
S' Lateral: 3.3 cm

## 2022-03-29 ENCOUNTER — Ambulatory Visit: Payer: Medicare Other | Admitting: Cardiology

## 2022-03-29 ENCOUNTER — Encounter: Payer: Self-pay | Admitting: Cardiology

## 2022-03-29 VITALS — BP 110/60 | HR 67 | Ht 68.0 in | Wt 167.0 lb

## 2022-03-29 DIAGNOSIS — I2510 Atherosclerotic heart disease of native coronary artery without angina pectoris: Secondary | ICD-10-CM

## 2022-03-29 DIAGNOSIS — I10 Essential (primary) hypertension: Secondary | ICD-10-CM

## 2022-03-29 MED ORDER — LOSARTAN POTASSIUM 25 MG PO TABS: 25.0000 mg | ORAL_TABLET | Freq: Every day | ORAL | 3 refills | Status: DC

## 2022-03-29 MED ORDER — CARVEDILOL 6.25 MG PO TABS: 6.2500 mg | ORAL_TABLET | Freq: Two times a day (BID) | ORAL | 3 refills | Status: DC

## 2022-03-29 NOTE — Progress Notes (Signed)
Cardiology Office Note:    Date:  03/29/2022   ID:  Karl Alvarez, DOB 1956-03-18, MRN [id number]  PCP:  Raina Mina., MD   [facility] Providers Cardiologist:  Candee Furbish, MD     Referring MD: Raina Mina., MD    History of Present Illness:    Karl Alvarez is a 66 y.o. male here for follow-up of coronary artery disease status post STEMI with DES to the RCA in 2015.  ZIO-PVC 7.3% in 2023-carvedilol increased to 25 twice daily. BP went down only 25 mg losartan daily. Seems low BP overall. Checks at home.   Stayed with mom on hospice. Dementia.   Not much appetite any more. Eats one large meal a day, first meal of the day. Counting carbs. Lost 15 pounds. At home 158 pounds.   Nuclear stress test-overall low risk, echocardiogram overall reassuring 2023.  This was done because he was feeling some chest tightness with walking and eased with rest.  Past Medical History:  Diagnosis Date   Anxiety    CAD (coronary artery disease) 08/2014   Inferior STEMI with PCI/DES to RCA - well preserved LV function   Colon polyps    Diabetes ([place])    DJD (degenerative joint disease)    Gastric polyps    History of BPH    HLD (hyperlipidemia)    HTN (hypertension)    Internal hemorrhoids    Mitral regurgitation    Osteoarthritis    Overweight    Pure hypercholesterolemia 08/18/2014   Salmonella gastroenteritis     Past Surgical History:  Procedure Laterality Date   LEFT HEART CATHETERIZATION WITH CORONARY ANGIOGRAM N/A 08/14/2014   Procedure: LEFT HEART CATHETERIZATION WITH CORONARY ANGIOGRAM;  Surgeon: Wellington Hampshire, MD;  Location: [facility];  Service: Cardiovascular;  Laterality: N/A;    Current Medications: Current Meds  Medication Sig   aspirin-sod bicarb-citric acid (ALKA-SELTZER) 325 MG TBEF tablet Take 325 mg by mouth at bedtime.   atorvastatin (LIPITOR) 40 MG tablet TAKE 1 TABLET BY MOUTH  DAILY   carvedilol (COREG) 6.25 MG tablet Take 1 tablet  (6.25 mg total) by mouth 2 (two) times daily.   ferrous sulfate 325 (65 FE) MG tablet Take 325 mg by mouth daily with breakfast.   gabapentin (NEURONTIN) 300 MG capsule Take 300 mg by mouth 3 (three) times daily. Two tablets in the morning and 3 tablets in the evening and one tablet at bedtime.   HYDROcodone-acetaminophen (NORCO) 10-325 MG tablet Take 1 tablet by mouth 4 (four) times daily as needed.   losartan (COZAAR) 25 MG tablet Take 1 tablet (25 mg total) by mouth daily.   metFORMIN (GLUCOPHAGE) 500 MG tablet Take 500 mg by mouth 2 (two) times daily.    multivitamin (ONE-A-DAY MEN'S) TABS tablet Take 1 tablet by mouth daily.   nitroGLYCERIN (NITROSTAT) 0.4 MG SL tablet DISSOLVE 1 TABLET UNDER TONGUE EVERY 5 MINUTES AS NEEDED FOR CHEST PAIN. IF NO RELIEF AFTER 3 DOSES CALL 911.   vitamin B-12 (CYANOCOBALAMIN) 1000 MCG tablet Take 1,000 mcg by mouth daily.   [DISCONTINUED] carvedilol (COREG) 25 MG tablet Take 1 tablet (25 mg total) by mouth 2 (two) times daily.   [DISCONTINUED] losartan (COZAAR) 50 MG tablet One half tablet by mouth ( 25 mg) daily.     Allergies:   Altace [ramipril]   Social History   Socioeconomic History   Marital status: Married    Spouse name: Not on file   Number  of children: Not on file   Years of education: Not on file   Highest education level: Not on file  Occupational History   Not on file  Tobacco Use   Smoking status: Former   Smokeless tobacco: Current    Types: Chew  Vaping Use   Vaping Use: Never used  Substance and Sexual Activity   Alcohol use: Not on file   Drug use: Not on file   Sexual activity: Not on file  Other Topics Concern   Not on file  Social History Narrative   Not on file   Social Determinants of Health   Financial Resource Strain: Low Risk  (12/25/2019)   Overall Financial Resource Strain (CARDIA)    Difficulty of Paying Living Expenses: Not hard at all  Food Insecurity: No Food Insecurity (12/25/2019)   Hunger Vital Sign     Worried About Running Out of Food in the Last Year: Never true    [place] in the Last Year: Never true  Transportation Needs: No Transportation Needs (12/25/2019)   PRAPARE - Hydrologist (Medical): No    Lack of Transportation (Non-Medical): No  Physical Activity: Insufficiently Active (12/25/2019)   Exercise Vital Sign    Days of Exercise per Week: 7 days    Minutes of Exercise per Session: 20 min  Stress: Stress Concern Present (12/25/2019)   [place]    Feeling of Stress : To some extent  Social Connections: Not on file     Family History: The patient's family history includes Hypertension in his brother and mother; Valvular heart disease in his mother.  ROS:   Please see the history of present illness.     All other systems reviewed and are negative.  EKGs/Labs/Other Studies Reviewed:    The following studies were reviewed today:  NUC stress 12/30/21:   Findings are consistent with prior myocardial infarction. The study is low risk.   No ST deviation was noted.   LV perfusion is abnormal. There is no evidence of ischemia. There is evidence of infarction. Defect 1: There is a small defect with moderate reduction in uptake present in the basal inferior and inferolateral location(s) that is fixed. There is abnormal wall motion in the defect area. Consistent with infarction.   Left ventricular function is normal. Nuclear stress EF: 51 %. The left ventricular ejection fraction is mildly decreased (45-54%). End diastolic cavity size is normal.   Prior study not available for comparison.   Abnormal, low risk stress nuclear study with prior basal inferolateral infarct but no ischemia.  Gated ejection fraction 51% with akinesis of the basal inferolateral wall.  ECHO 01/14/22:   1. Left ventricular ejection fraction, by estimation, is 55 to 60%. The  left ventricle has normal  function. The left ventricle demonstrates  regional wall motion abnormalities with basal inferior akinesis. Left  ventricular diastolic parameters are  consistent with Grade I diastolic dysfunction (impaired relaxation).   2. Right ventricular systolic function is normal. The right ventricular  size is normal. Tricuspid regurgitation signal is inadequate for assessing  PA pressure.   3. Left atrial size was mildly dilated.   4. The mitral valve is normal in structure. Trivial mitral valve  regurgitation. No evidence of mitral stenosis.   5. The aortic valve is tricuspid. Aortic valve regurgitation is not  visualized. No aortic stenosis is present.   6. Aortic dilatation noted. There is  mild dilatation of the aortic root,  measuring 37 mm.   7. The inferior vena cava is normal in size with greater than 50%  respiratory variability, suggesting right atrial pressure of 3 mmHg.   EKG:  EKG is  ordered today.  The ekg ordered today demonstrates normal sinus rhythm 67 small inferior Q waves.  Recent Labs: No results found for requested labs within last 365 days.  Recent Lipid Panel    Component Value Date/Time   CHOL 165 08/15/2014 0019   TRIG 198 (H) 08/15/2014 0019   HDL 30 (L) 08/15/2014 0019   CHOLHDL 5.5 08/15/2014 0019   VLDL 40 08/15/2014 0019   LDLCALC 95 08/15/2014 0019     Risk Assessment/Calculations:              Physical Exam:    VS:  BP 110/60 (BP Location: Left Arm, Patient Position: Sitting, Cuff Size: Normal)   Pulse 67   Ht '5\' 8"'$  (1.727 m)   Wt 167 lb (75.8 kg)   SpO2 97%   BMI 25.39 kg/m     Wt Readings from Last 3 Encounters:  03/29/22 167 lb (75.8 kg)  12/30/21 179 lb (81.2 kg)  12/16/21 179 lb (81.2 kg)     GEN:  Well nourished, well developed in no acute distress HEENT: Normal NECK: No JVD; No carotid bruits LYMPHATICS: No lymphadenopathy CARDIAC: RRR, no murmurs, no rubs, gallops RESPIRATORY:  Clear to auscultation without rales, wheezing  or rhonchi  ABDOMEN: Soft, non-tender, non-distended MUSCULOSKELETAL:  No edema; No deformity  SKIN: Warm and dry NEUROLOGIC:  Alert and oriented x 3 PSYCHIATRIC:  Normal affect   ASSESSMENT:    1. Coronary artery disease involving native coronary artery of native heart without angina pectoris   2. Essential hypertension    PLAN:    In order of problems listed above:    Hypotension  - will decrease coreg from 25 to 6.25 BID  - OK to take losartan '25mg'$  daily.   -In the future, may need to decrease his losartan even more. I will have him come back in in about 3 months to see APP to see how his blood pressure is doing and how he is feeling.  Coronary artery disease - RCA stent 2015 STEMI.  Inferior Q waves noted on ECG.  Stable.  Stress test reassuring.  Echo reassuring. -Continue with goal-directed medical therapy.  Hyperlipidemia - Continue with atorvastatin 40 mg a day.  Diabetes - On metformin  Preoperative left thumb surgery - Has had intense pain in his thumb.  He may proceed with surgery if felt necessary by orthopedics.  He would be of low overall cardiac risk.   Weight loss - Approximately 15 pounds.  Decreasing carbs.  1 big meal a day.  May not need as much antihypertensive.      Medication Adjustments/Labs and Tests Ordered: Current medicines are reviewed at length with the patient today.  Concerns regarding medicines are outlined above.  Orders Placed This Encounter  Procedures   EKG 12-Lead   Meds ordered this encounter  Medications   losartan (COZAAR) 25 MG tablet    Sig: Take 1 tablet (25 mg total) by mouth daily.    Dispense:  90 tablet    Refill:  3   carvedilol (COREG) 6.25 MG tablet    Sig: Take 1 tablet (6.25 mg total) by mouth 2 (two) times daily.    Dispense:  180 tablet    Refill:  3  Patient Instructions  Medication Instructions:  Please decrease Carvedilol to 6.25 mg twice a day. Continue taking Losartan 25 mg daily. Continue  all other medications as listed.  *If you need a refill on your cardiac medications before your next appointment, please call your pharmacy*  Follow-Up: At [facility], you and your health needs are our priority.  As part of our continuing mission to provide you with exceptional heart care, we have created designated Provider Care Teams.  These Care Teams include your primary Cardiologist (physician) and Advanced Practice Providers (APPs -  Physician Assistants and Nurse Practitioners) who all work together to provide you with the care you need, when you need it.  We recommend signing up for the patient portal called "MyChart".  Sign up information is provided on this After Visit Summary.  MyChart is used to connect with patients for Virtual Visits (Telemedicine).  Patients are able to view lab/test results, encounter notes, upcoming appointments, etc.  Non-urgent messages can be sent to your provider as well.   To learn more about what you can do with MyChart, go to [email or web address].    Your next appointment:   3 month(s)  The format for your next appointment:   In Person  Provider:   Any PA/NP.  } Important Information About Sugar         Signed, Candee Furbish, MD  03/29/2022 12:36 PM    [place]

## 2022-03-29 NOTE — Patient Instructions (Signed)
Medication Instructions:  Please decrease Carvedilol to 6.25 mg twice a day. Continue taking Losartan 25 mg daily. Continue all other medications as listed.  *If you need a refill on your cardiac medications before your next appointment, please call your pharmacy*  Follow-Up: At [facility], you and your health needs are our priority.  As part of our continuing mission to provide you with exceptional heart care, we have created designated Provider Care Teams.  These Care Teams include your primary Cardiologist (physician) and Advanced Practice Providers (APPs -  Physician Assistants and Nurse Practitioners) who all work together to provide you with the care you need, when you need it.  We recommend signing up for the patient portal called "MyChart".  Sign up information is provided on this After Visit Summary.  MyChart is used to connect with patients for Virtual Visits (Telemedicine).  Patients are able to view lab/test results, encounter notes, upcoming appointments, etc.  Non-urgent messages can be sent to your provider as well.   To learn more about what you can do with MyChart, go to [email or web address].    Your next appointment:   3 month(s)  The format for your next appointment:   In Person  Provider:   Any PA/NP.  } Important Information About Sugar

## 2022-04-09 ENCOUNTER — Telehealth: Payer: Self-pay

## 2022-04-09 DIAGNOSIS — Z0000 Encounter for general adult medical examination without abnormal findings: Secondary | ICD-10-CM

## 2022-04-09 NOTE — Telephone Encounter (Signed)
Called patient to determine if he had returned the Vivify cuff after completing the program or if he still had access and need to return the device. Patient stated that he was never given a cuff to check his bp when he was a patient of Dr. Oval Linsey. Patient shared that he had his own machine that he used to check his bp and wrote down his readings to bring to his visits. Will update Dr. Oval Linsey.   Docie Abramovich Truman Hayward, [facility] Guide, Health Coach [address], [address] [place] [address] Telephone: [phone] Email: [email or web address]

## 2022-06-22 ENCOUNTER — Other Ambulatory Visit: Payer: Self-pay | Admitting: Cardiology

## 2022-07-01 ENCOUNTER — Encounter: Payer: Self-pay | Admitting: Cardiology

## 2022-07-01 ENCOUNTER — Ambulatory Visit: Payer: Medicare Other | Attending: Cardiology | Admitting: Cardiology

## 2022-07-01 VITALS — BP 140/80 | HR 76 | Ht 68.0 in | Wt 170.0 lb

## 2022-07-01 DIAGNOSIS — I2583 Coronary atherosclerosis due to lipid rich plaque: Secondary | ICD-10-CM

## 2022-07-01 DIAGNOSIS — I2510 Atherosclerotic heart disease of native coronary artery without angina pectoris: Secondary | ICD-10-CM | POA: Diagnosis not present

## 2022-07-01 DIAGNOSIS — I10 Essential (primary) hypertension: Secondary | ICD-10-CM

## 2022-07-01 NOTE — Progress Notes (Signed)
Cardiology Office Note:    Date:  07/01/2022   ID:  Karl Alvarez, DOB Apr 14, 1956, MRN [id number]  PCP:  Raina Mina., MD   [facility] Providers Cardiologist:  Candee Furbish, MD     Referring MD: Raina Mina., MD    History of Present Illness:    Karl Alvarez is a 66 y.o. male here for follow-up of coronary artery disease status post STEMI with DES to the RCA in 2015, and hypertension.  Last visit his ZIO-PVC was 7.3% in 2023. His carvedilol was increased to 25 twice daily. His losartan was adjusted to 25 mg losartan daily. Seemed low BP overall. Checked at home. He had been staying with his mom due to his dementia. He had been losing weight due to a loss of appetite and had lost around 15 pounds in the few months before the visit His nuclear stress test was low risk, and was administered due to chest pain that modulated with activity.  Today, the patient states that his blood pressure hasn't been bothering him recently. He has had no episodes of dizziness or lightheadedness in the last month, and hasn't had to take any losartan in over a month. He was taking blood pressures at home but now that his symptoms have improved he is checking it less often. His blood pressure in clinic is 140/80 but he attributes that to eating too much salt today.  He has a strange heartbeat at night when he lies down; he states that he can feel it beating hard.  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 No lightheadedness, headaches, syncope, orthopnea, or PND.  Past Medical History:  Diagnosis Date   Anxiety    CAD (coronary artery disease) 08/2014   Inferior STEMI with PCI/DES to RCA - well preserved LV function   Colon polyps    Diabetes ([place])    DJD (degenerative  joint disease)    Gastric polyps    History of BPH    HLD (hyperlipidemia)    HTN (hypertension)    Internal hemorrhoids    Mitral regurgitation    Osteoarthritis    Overweight    Pure hypercholesterolemia 08/18/2014   Salmonella gastroenteritis     Past Surgical History:  Procedure Laterality Date   LEFT HEART CATHETERIZATION WITH CORONARY ANGIOGRAM N/A 08/14/2014   Procedure: LEFT HEART CATHETERIZATION WITH CORONARY ANGIOGRAM;  Surgeon: Wellington Hampshire, MD;  Location: [facility];  Service: Cardiovascular;  Laterality: N/A;    Current Medications: Current Meds  Medication Sig   aspirin-sod bicarb-citric acid (ALKA-SELTZER) 325 MG TBEF tablet Take 325 mg by mouth at bedtime.   atorvastatin (LIPITOR) 40 MG tablet TAKE 1 TABLET BY MOUTH DAILY   carvedilol (COREG) 6.25 MG tablet Take 1 tablet (6.25 mg total) by mouth 2 (two) times daily.   ferrous sulfate 325 (65 FE) MG tablet Take 325 mg by mouth daily with breakfast.   gabapentin (NEURONTIN) 300 MG capsule Take 300 mg by mouth 3 (three) times daily. Two tablets in the morning and 3 tablets in the evening and one tablet at bedtime.   HYDROcodone-acetaminophen (NORCO) 10-325 MG tablet Take 1 tablet by mouth 4 (four) times daily as needed.   metFORMIN (GLUCOPHAGE)  500 MG tablet Take 500 mg by mouth 2 (two) times daily.    multivitamin (ONE-A-DAY MEN'S) TABS tablet Take 1 tablet by mouth daily.   nitroGLYCERIN (NITROSTAT) 0.4 MG SL tablet DISSOLVE 1 TABLET UNDER TONGUE EVERY 5 MINUTES AS NEEDED FOR CHEST PAIN. IF NO RELIEF AFTER 3 DOSES CALL 911.   vitamin B-12 (CYANOCOBALAMIN) 1000 MCG tablet Take 1,000 mcg by mouth daily.     Allergies:   Altace [ramipril]   Social History   Socioeconomic History   Marital status: Married    Spouse name: Not on file   Number of children: Not on file   Years of education: Not on file   Highest education level: Not on file  Occupational History   Not on file  Tobacco Use   Smoking status:  Former   Smokeless tobacco: Current    Types: Chew  Vaping Use   Vaping Use: Never used  Substance and Sexual Activity   Alcohol use: Not on file   Drug use: Not on file   Sexual activity: Not on file  Other Topics Concern   Not on file  Social History Narrative   Not on file   Social Determinants of Health   Financial Resource Strain: Low Risk  (12/25/2019)   Overall Financial Resource Strain (CARDIA)    Difficulty of Paying Living Expenses: Not hard at all  Food Insecurity: No Food Insecurity (12/25/2019)   Hunger Vital Sign    Worried About Running Out of Food in the Last Year: Never true    [place] in the Last Year: Never true  Transportation Needs: No Transportation Needs (12/25/2019)   PRAPARE - Hydrologist (Medical): No    Lack of Transportation (Non-Medical): No  Physical Activity: Insufficiently Active (12/25/2019)   Exercise Vital Sign    Days of Exercise per Week: 7 days    Minutes of Exercise per Session: 20 min  Stress: Stress Concern Present (12/25/2019)   [place]    Feeling of Stress : To some extent  Social Connections: Not on file     Family History: The patient's family history includes Hypertension in his brother and mother; Valvular heart disease in his mother.  ROS:   Please see the history of present illness.    (+) Palpitations (+) Left Thumb Arthralgia All other systems reviewed and are negative.  EKGs/Labs/Other Studies Reviewed:    The following studies were reviewed today:  NUC stress 12/30/21:   Findings are consistent with prior myocardial infarction. The study is low risk.   No ST deviation was noted.   LV perfusion is abnormal. There is no evidence of ischemia. There is evidence of infarction. Defect 1: There is a small defect with moderate reduction in uptake present in the basal inferior and inferolateral location(s) that is fixed.  There is abnormal wall motion in the defect area. Consistent with infarction.   Left ventricular function is normal. Nuclear stress EF: 51 %. The left ventricular ejection fraction is mildly decreased (45-54%). End diastolic cavity size is normal.   Prior study not available for comparison.   Abnormal, low risk stress nuclear study with prior basal inferolateral infarct but no ischemia.  Gated ejection fraction 51% with akinesis of the basal inferolateral wall.  ECHO 01/14/22:   1. Left ventricular ejection fraction, by estimation, is 55 to 60%. The  left ventricle has normal function. The left ventricle demonstrates  regional wall motion abnormalities with basal inferior akinesis. Left  ventricular diastolic parameters are  consistent with Grade I diastolic dysfunction (impaired relaxation).   2. Right ventricular systolic function is normal. The right ventricular  size is normal. Tricuspid regurgitation signal is inadequate for assessing  PA pressure.   3. Left atrial size was mildly dilated.   4. The mitral valve is normal in structure. Trivial mitral valve  regurgitation. No evidence of mitral stenosis.   5. The aortic valve is tricuspid. Aortic valve regurgitation is not  visualized. No aortic stenosis is present.   6. Aortic dilatation noted. There is mild dilatation of the aortic root,  measuring 37 mm.   7. The inferior vena cava is normal in size with greater than 50%  respiratory variability, suggesting right atrial pressure of 3 mmHg.   EKG:  EKG is personally reviewed. 07/01/2022: EKG is not ordered today. 03/29/2022: NSR 67 bpm small inferior Q waves.  Recent Labs: No results found for requested labs within last 365 days.  Recent Lipid Panel    Component Value Date/Time   CHOL 165 08/15/2014 0019   TRIG 198 (H) 08/15/2014 0019   HDL 30 (L) 08/15/2014 0019   CHOLHDL 5.5 08/15/2014 0019   VLDL 40 08/15/2014 0019   LDLCALC 95 08/15/2014 0019     Risk  Assessment/Calculations:              Physical Exam:    VS:  BP (!) 140/80 (BP Location: Left Arm, Patient Position: Sitting, Cuff Size: Normal)   Pulse 76   Ht '5\' 8"'$  (1.727 m)   Wt 170 lb (77.1 kg)   SpO2 96%   BMI 25.85 kg/m     Wt Readings from Last 3 Encounters:  07/01/22 170 lb (77.1 kg)  03/29/22 167 lb (75.8 kg)  12/30/21 179 lb (81.2 kg)     GEN:  Well nourished, well developed in no acute distress HEENT: Normal NECK: No JVD; No carotid bruits LYMPHATICS: No lymphadenopathy CARDIAC: RRR, no murmurs, no rubs, gallops RESPIRATORY:  Clear to auscultation without rales, wheezing or rhonchi  ABDOMEN: Soft, non-tender, non-distended MUSCULOSKELETAL:  No edema; No deformity  SKIN: Warm and dry NEUROLOGIC:  Alert and oriented x 3 PSYCHIATRIC:  Normal affect   ASSESSMENT:    1. Coronary artery disease due to lipid rich plaque   2. Essential hypertension     PLAN:    In order of problems listed above:  Hypotension -We decreased his carvedilol to 6.25 mg down from 25 previously.  He has stopped his losartan 25.  Blood pressures have been reasonable now.  Previously he had hypotension.  Continue to monitor.   Coronary artery disease - RCA stent 2015 STEMI.  Inferior Q waves noted on ECG.  Stable.  Stress test reassuring.  Echo reassuring. -Continue with goal-directed medical therapy.  No changes made.  Overall doing well without any anginal symptoms.  Hyperlipidemia - Continue with atorvastatin 40 mg a day.  LDL goal is less than 70.  He is not having any myalgias.  Diabetes - On metformin  Preoperative left thumb surgery - Has had intense pain in his thumb.  He may proceed with surgery if felt necessary by orthopedics.  He would be of low overall cardiac risk.  He did have 2 injections, he says that his thumb now hurts worse.   Weight loss - Approximately 15 pounds.  Decreasing carbs.  1 big meal a day.  May not need as much  antihypertensive.  Continue to  monitor.  Follow Up: 6 months APP, 1 year with me  Medication Adjustments/Labs and Tests Ordered: Current medicines are reviewed at length with the patient today.  Concerns regarding medicines are outlined above.  No orders of the defined types were placed in this encounter.  No orders of the defined types were placed in this encounter.  Patient Instructions  Medication Instructions:  The current medical regimen is effective;  continue present plan and medications.  *If you need a refill on your cardiac medications before your next appointment, please call your pharmacy*  Follow-Up: At [facility], you and your health needs are our priority.  As part of our continuing mission to provide you with exceptional heart care, we have created designated Provider Care Teams.  These Care Teams include your primary Cardiologist (physician) and Advanced Practice Providers (APPs -  Physician Assistants and Nurse Practitioners) who all work together to provide you with the care you need, when you need it.  We recommend signing up for the patient portal called "MyChart".  Sign up information is provided on this After Visit Summary.  MyChart is used to connect with patients for Virtual Visits (Telemedicine).  Patients are able to view lab/test results, encounter notes, upcoming appointments, etc.  Non-urgent messages can be sent to your provider as well.   To learn more about what you can do with MyChart, go to [email or web address].    Your next appointment:   6 month(s)  The format for your next appointment:   In Person  Provider:   Nicholes Rough, PA-C, Melina Copa, PA-C, Ambrose Pancoast, NP, Ermalinda Barrios, PA-C, Christen Bame, NP, or Richardson Dopp, PA-C     Then, Candee Furbish, MD will plan to see you again in 1 year(s).    Important Information About Sugar         I,Coren O'Brien,acting as a scribe for Candee Furbish, MD.,have documented all relevant documentation on the behalf of Candee Furbish, MD,as directed by  Candee Furbish, MD while in the presence of Candee Furbish, MD.  I, Candee Furbish, MD, have reviewed all documentation for this visit. The documentation on 07/01/22 for the exam, diagnosis, procedures, and orders are all accurate and complete.   Signed, Candee Furbish, MD  07/01/2022 5:06 PM    [facility]

## 2022-07-01 NOTE — Patient Instructions (Signed)
Medication Instructions:  The current medical regimen is effective;  continue present plan and medications.  *If you need a refill on your cardiac medications before your next appointment, please call your pharmacy*  Follow-Up: At [facility], you and your health needs are our priority.  As part of our continuing mission to provide you with exceptional heart care, we have created designated Provider Care Teams.  These Care Teams include your primary Cardiologist (physician) and Advanced Practice Providers (APPs -  Physician Assistants and Nurse Practitioners) who all work together to provide you with the care you need, when you need it.  We recommend signing up for the patient portal called "MyChart".  Sign up information is provided on this After Visit Summary.  MyChart is used to connect with patients for Virtual Visits (Telemedicine).  Patients are able to view lab/test results, encounter notes, upcoming appointments, etc.  Non-urgent messages can be sent to your provider as well.   To learn more about what you can do with MyChart, go to [email or web address].    Your next appointment:   6 month(s)  The format for your next appointment:   In Person  Provider:   Nicholes Rough, PA-C, Melina Copa, PA-C, Ambrose Pancoast, NP, Ermalinda Barrios, PA-C, Christen Bame, NP, or Richardson Dopp, PA-C     Then, Candee Furbish, MD will plan to see you again in 1 year(s).    Important Information About Sugar

## 2022-07-02 ENCOUNTER — Encounter: Payer: Self-pay | Admitting: Internal Medicine

## 2023-01-21 ENCOUNTER — Other Ambulatory Visit: Payer: Self-pay | Admitting: Cardiology

## 2023-02-01 NOTE — Progress Notes (Unsigned)
Cardiology Office Note:    Date:  02/02/2023   ID:  Karl Alvarez, DOB [birth year], MRN [id number]  PCP:  Gordan Payment., MD  [facility] Providers Cardiologist:  Donato Schultz, MD     Referring MD: Gordan Payment., MD   Chief Complaint:  Follow-up     History of Present Illness:   Karl Alvarez is a 67 y.o. male with with history of Coronary artery disease status post STEMI with DES to RCA in 2015.  Has hypertension hyperlipidemia diabetes.   Patient called in with palpitations 09/2021 and Zio showed PVC's 7.3% Dr. Anne Fu increased his carvedilol 25 mg bid.He had been staying with his mom due to her having  dementia. He had been losing weight due to a loss of appetite and had lost around 15 pounds in the few months before the visit His nuclear stress test was low risk, and was administered due to chest pain that modulated with activity.  Patient saw Dr. Anne Fu 06/2022 and hadn't taken losartan due to low BP's. Had 15 lb weight loss due to decreasing carbs.   Patient comes in for regular f/u. He says he's doing better. On less meds and feeling better. 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 Has gained 12 lbs back since he stopped his low carb diet. A1C up to 8.0 and needs to get it down. No chest pain, dyspnea, dizziness, edema, palpitations. Walks his dog 2-3 miles daily at fast pace.  His mother died dementia and he's wondering if he's developing some symptoms.     Past Medical History:  Diagnosis Date   Anxiety    CAD (coronary artery disease) 08/2014   Inferior STEMI with PCI/DES to RCA - well preserved LV function   Colon polyps    Diabetes ([facility])    DJD (degenerative joint disease)    Gastric polyps    History of BPH    HLD (hyperlipidemia)    HTN (hypertension)    Internal hemorrhoids    Mitral regurgitation    Osteoarthritis    Overweight    Pure hypercholesterolemia 08/18/2014   Salmonella gastroenteritis    Current  Medications: Current Meds  Medication Sig   aspirin-sod bicarb-citric acid (ALKA-SELTZER) 325 MG TBEF tablet Take 325 mg by mouth at bedtime.   atorvastatin (LIPITOR) 40 MG tablet TAKE 1 TABLET BY MOUTH DAILY   carvedilol (COREG) 6.25 MG tablet Take 1 tablet (6.25 mg total) by mouth 2 (two) times daily.   ferrous sulfate 325 (65 FE) MG tablet Take 325 mg by mouth daily with breakfast.   gabapentin (NEURONTIN) 300 MG capsule Take 300 mg by mouth 3 (three) times daily. Two tablets in the morning and 3 tablets in the evening and one tablet at bedtime.   HYDROcodone-acetaminophen (NORCO) 10-325 MG tablet Take 1 tablet by mouth 4 (four) times daily as needed.   metFORMIN (GLUCOPHAGE) 500 MG tablet Take 500 mg by mouth 2 (two) times daily.    multivitamin (ONE-A-DAY MEN'S) TABS tablet Take 1 tablet by mouth daily.   naloxone (NARCAN) nasal spray 4 mg/0.1 mL as directed. Over dose   nitroGLYCERIN (NITROSTAT) 0.4 MG SL tablet Place 1 tablet (0.4 mg total) under the tongue every 5 (five) minutes as needed for chest pain.   vitamin B-12 (CYANOCOBALAMIN) 1000 MCG tablet Take 1,000 mcg by mouth daily.   [DISCONTINUED] nitroGLYCERIN (NITROSTAT) 0.4 MG SL tablet DISSOLVE 1 TABLET UNDER TONGUE EVERY 5 MINUTES AS NEEDED FOR CHEST  PAIN. IF NO RELIEF AFTER 3 DOSES CALL 911.    Allergies:   Altace [ramipril]   Social History   Tobacco Use   Smoking status: Former   Smokeless tobacco: Current    Types: Associate Professor Use: Never used    Family Hx: The patient's family history includes Hypertension in his brother and mother; Valvular heart disease in his mother.  ROS     Physical Exam:    VS:  BP 138/78   Pulse 80   Ht 5\' 9"  (1.753 m)   Wt 184 lb (83.5 kg)   SpO2 97%   BMI 27.17 kg/m     Wt Readings from Last 3 Encounters:  02/02/23 184 lb (83.5 kg)  07/01/22 170 lb (77.1 kg)  03/29/22 167 lb (75.8 kg)    Physical Exam  GEN: Well nourished, well developed, in no acute distress   Neck: no JVD, carotid bruits, or masses Cardiac:RRR; no murmurs, rubs, or gallops  Respiratory:  clear to auscultation bilaterally, normal work of breathing GI: soft, nontender, nondistended, + BS Ext: without cyanosis, clubbing, or edema, Good distal pulses bilaterally Neuro:  Alert and Oriented x 3, Psych: euthymic mood, full affect        EKGs/Labs/Other Test Reviewed:    EKG:  EKG is   ordered today.  The ekg ordered today demonstrates NSR inf infarct unchanged  Recent Labs: No results found for requested labs within last 365 days.   Recent Lipid Panel No results for input(s): "CHOL", "TRIG", "HDL", "VLDL", "LDLCALC", "LDLDIRECT" in the last 8760 hours.   Prior CV Studies:      NUC stress 12/30/21:   Findings are consistent with prior myocardial infarction. The study is low risk.   No ST deviation was noted.   LV perfusion is abnormal. There is no evidence of ischemia. There is evidence of infarction. Defect 1: There is a small defect with moderate reduction in uptake present in the basal inferior and inferolateral location(s) that is fixed. There is abnormal wall motion in the defect area. Consistent with infarction.   Left ventricular function is normal. Nuclear stress EF: 51 %. The left ventricular ejection fraction is mildly decreased (45-54%). End diastolic cavity size is normal.   Prior study not available for comparison.   Abnormal, low risk stress nuclear study with prior basal inferolateral infarct but no ischemia.  Gated ejection fraction 51% with akinesis of the basal inferolateral wall.   ECHO 01/14/22:   1. Left ventricular ejection fraction, by estimation, is 55 to 60%. The  left ventricle has normal function. The left ventricle demonstrates  regional wall motion abnormalities with basal inferior akinesis. Left  ventricular diastolic parameters are  consistent with Grade I diastolic dysfunction (impaired relaxation).   2. Right ventricular systolic function  is normal. The right ventricular  size is normal. Tricuspid regurgitation signal is inadequate for assessing  PA pressure.   3. Left atrial size was mildly dilated.   4. The mitral valve is normal in structure. Trivial mitral valve  regurgitation. No evidence of mitral stenosis.   5. The aortic valve is tricuspid. Aortic valve regurgitation is not  visualized. No aortic stenosis is present.   6. Aortic dilatation noted. There is mild dilatation of the aortic root,  measuring 37 mm.   7. The inferior vena cava is normal in size with greater than 50%  respiratory variability, suggesting right atrial pressure of 3 mmHg.    Risk Assessment/Calculations/Metrics:  ASSESSMENT & PLAN:   No problem-specific Assessment & Plan notes found for this encounter.   Coronary artery disease - RCA stent 2015 STEMI.  Inferior Q waves noted on ECG.  Stable.  Stress test 12/2021 reassuring.  Echo reassuring 01/2022 -Continue with goal-directed medical therapy.  No changes made.  Overall doing well without any anginal symptoms.  Hypertension/Hypotension stable on decreased his carvedilol to 6.25 mg down from 25 previously.      Hyperlipidemia - Continue with atorvastatin 40 mg a day.  LDL goal is less than 83, trig 258 01/13/23 [facility](not fasting).  He is not having any myalgias.   Diabetes - On metformin -A1C 8.0 and trying to get it down.            Dispo:  No follow-ups on file.   Medication Adjustments/Labs and Tests Ordered: Current medicines are reviewed at length with the patient today.  Concerns regarding medicines are outlined above.  Tests Ordered: Orders Placed This Encounter  Procedures   EKG 12-Lead   Medication Changes: Meds ordered this encounter  Medications   nitroGLYCERIN (NITROSTAT) 0.4 MG SL tablet    Sig: Place 1 tablet (0.4 mg total) under the tongue every 5 (five) minutes as needed for chest pain.    Dispense:  90 tablet    Refill:  3    Signed, Jacolyn Reedy, PA-C  02/02/2023 1:12 PM    [facility] [address] [place], [place], [place]  [address] Phone: [phone]; Fax: [phone]

## 2023-02-02 ENCOUNTER — Encounter: Payer: Self-pay | Admitting: Physician Assistant

## 2023-02-02 ENCOUNTER — Ambulatory Visit: Payer: Medicare Other | Attending: Physician Assistant | Admitting: Physician Assistant

## 2023-02-02 VITALS — BP 138/78 | HR 80 | Ht 69.0 in | Wt 184.0 lb

## 2023-02-02 DIAGNOSIS — E119 Type 2 diabetes mellitus without complications: Secondary | ICD-10-CM

## 2023-02-02 DIAGNOSIS — I10 Essential (primary) hypertension: Secondary | ICD-10-CM | POA: Diagnosis not present

## 2023-02-02 DIAGNOSIS — I2510 Atherosclerotic heart disease of native coronary artery without angina pectoris: Secondary | ICD-10-CM | POA: Diagnosis not present

## 2023-02-02 DIAGNOSIS — I2583 Coronary atherosclerosis due to lipid rich plaque: Secondary | ICD-10-CM

## 2023-02-02 DIAGNOSIS — E7849 Other hyperlipidemia: Secondary | ICD-10-CM

## 2023-02-02 DIAGNOSIS — Z7984 Long term (current) use of oral hypoglycemic drugs: Secondary | ICD-10-CM

## 2023-02-02 MED ORDER — NITROGLYCERIN 0.4 MG SL SUBL: 0.4000 mg | SUBLINGUAL_TABLET | SUBLINGUAL | 3 refills | Status: AC | PRN

## 2023-02-02 NOTE — Patient Instructions (Signed)
Medication Instructions:  Your physician recommends that you continue on your current medications as directed. Please refer to the Current Medication list given to you today.  *If you need a refill on your cardiac medications before your next appointment, please call your pharmacy*   Follow-Up: At [facility], you and your health needs are our priority.  As part of our continuing mission to provide you with exceptional heart care, we have created designated Provider Care Teams.  These Care Teams include your primary Cardiologist (physician) and Advanced Practice Providers (APPs -  Physician Assistants and Nurse Practitioners) who all work together to provide you with the care you need, when you need it.   Your next appointment:   1 year(s)  Provider:   Mark Skains, MD     

## 2023-03-13 ENCOUNTER — Other Ambulatory Visit: Payer: Self-pay | Admitting: Cardiology

## 2023-03-26 HISTORY — PX: THUMB ARTHROSCOPY: SHX2509

## 2023-05-10 DIAGNOSIS — M79645 Pain in left finger(s): Secondary | ICD-10-CM | POA: Diagnosis not present

## 2023-05-12 ENCOUNTER — Encounter: Payer: Self-pay | Admitting: Internal Medicine

## 2023-05-12 DIAGNOSIS — E538 Deficiency of other specified B group vitamins: Secondary | ICD-10-CM | POA: Diagnosis not present

## 2023-05-12 DIAGNOSIS — E782 Mixed hyperlipidemia: Secondary | ICD-10-CM | POA: Diagnosis not present

## 2023-05-12 DIAGNOSIS — R748 Abnormal levels of other serum enzymes: Secondary | ICD-10-CM | POA: Diagnosis not present

## 2023-05-12 DIAGNOSIS — R079 Chest pain, unspecified: Secondary | ICD-10-CM | POA: Diagnosis not present

## 2023-05-12 DIAGNOSIS — K209 Esophagitis, unspecified without bleeding: Secondary | ICD-10-CM | POA: Diagnosis not present

## 2023-05-12 DIAGNOSIS — I10 Essential (primary) hypertension: Secondary | ICD-10-CM | POA: Diagnosis not present

## 2023-05-16 ENCOUNTER — Encounter: Payer: Self-pay | Admitting: Cardiology

## 2023-05-16 DIAGNOSIS — M79645 Pain in left finger(s): Secondary | ICD-10-CM | POA: Diagnosis not present

## 2023-05-16 DIAGNOSIS — R748 Abnormal levels of other serum enzymes: Secondary | ICD-10-CM | POA: Diagnosis not present

## 2023-05-18 DIAGNOSIS — K591 Functional diarrhea: Secondary | ICD-10-CM | POA: Diagnosis not present

## 2023-05-18 DIAGNOSIS — R7401 Elevation of levels of liver transaminase levels: Secondary | ICD-10-CM | POA: Diagnosis not present

## 2023-05-18 DIAGNOSIS — R1012 Left upper quadrant pain: Secondary | ICD-10-CM | POA: Diagnosis not present

## 2023-05-18 DIAGNOSIS — R945 Abnormal results of liver function studies: Secondary | ICD-10-CM | POA: Diagnosis not present

## 2023-05-20 ENCOUNTER — Ambulatory Visit: Payer: Medicare Other | Admitting: Internal Medicine

## 2023-05-20 ENCOUNTER — Telehealth: Payer: Self-pay | Admitting: Internal Medicine

## 2023-05-20 ENCOUNTER — Encounter: Payer: Self-pay | Admitting: Internal Medicine

## 2023-05-20 VITALS — BP 134/88 | HR 88 | Temp 97.7°F | Resp 18 | Ht 69.0 in | Wt 179.0 lb

## 2023-05-20 DIAGNOSIS — R109 Unspecified abdominal pain: Secondary | ICD-10-CM | POA: Diagnosis not present

## 2023-05-20 DIAGNOSIS — R1013 Epigastric pain: Secondary | ICD-10-CM | POA: Diagnosis not present

## 2023-05-20 MED ORDER — PANTOPRAZOLE SODIUM 40 MG PO TBEC: 40.0000 mg | DELAYED_RELEASE_TABLET | Freq: Two times a day (BID) | ORAL | 0 refills | Status: DC

## 2023-05-20 MED ORDER — PANTOPRAZOLE SODIUM 40 MG PO TBEC: 40.0000 mg | DELAYED_RELEASE_TABLET | Freq: Every day | ORAL | 3 refills | Status: DC

## 2023-05-20 NOTE — Telephone Encounter (Signed)
Patient scheduled to see Boone Master 05/30/23 at 1:30pm. Please notify pt of appt.

## 2023-05-20 NOTE — Telephone Encounter (Signed)
Urgent referral in [place] from PCP for epigastric pain.  Patient is scheduled for 07/27/2023 but PCP is requesting sooner appt.  Last saw Dr. Marina Goodell in 2018.  Please review and advise urgency and scheduling.

## 2023-05-20 NOTE — Addendum Note (Signed)
Addended by: Crist Fat on: 05/20/2023 05:04 PM   Modules accepted: Orders

## 2023-05-20 NOTE — Telephone Encounter (Signed)
Left detailed message on vmail regarding appt on 05/30/2023

## 2023-05-20 NOTE — Progress Notes (Signed)
Office Visit  Subjective   Patient ID: Karl Alvarez   DOB: 04/07/1956   Age: 67 y.o.   MRN: [id number]   Chief Complaint Chief Complaint  Patient presents with   Establish Care     History of Present Illness Karl Alvarez is here today for consultation regarding abdominal.  He states this pain started in July after having hand surgery.  He began having epigastric pain that was burning and then it moved to his left side of his abdomen in his LUQ.  This was an intermittent pain then later migrated down to his mid abdomen.  Today, states when he eats something, he will start having bloating and he will then have epigastric pain that will evolve into mid abdominal pain.  This pain will last 8 hours.  There is no nausea or vomiting.  He states he has never had "normal bowel movements" his entire life.  He normally has to take a laxative to have a bowel movement.  He would normally take a laxative every 4 days to have a bowel movement but now he has been having them daily this week.  He did an herbal cleanse for his liver.  He denies any diarrhea, constipation, melena, or BRBPR.  He has been seeing urgent care this past Wednesday where he was told he had a "blockage" but they did not do any xrays.  He was given stool culture to bring in to them (he has them in his hand today) but again he is not having diarrhea.  He had blood work done as well.  He also saw his primary care on 05/12/2023 for complaints of heartburn and indigestion.  He was concerned about his heart where did an EKG and this showed normal sinus rhythm.  They did start him on Carafate 1 g 4 times a day for possible esophagitis. He was not put on a PPI.  They did blood work that showed his liver enzymes were elevated with AST 177 and ALT 367 with ALP of 146.  His primary care made an appointment with GI with Dr. Marina Goodell at [facility] on 07/27/2023.   I did call urgent care today and he had additonal labs this past week where he had Hepatitis B  and C lab work which was unremarkable.     Past Medical History Past Medical History:  Diagnosis Date   Anxiety    CAD (coronary artery disease) 08/2014   Inferior STEMI with PCI/DES to RCA - well preserved LV function   Colon polyps    Diabetes (HCC)    DJD (degenerative joint disease)    Gastric polyps    History of BPH    HLD (hyperlipidemia)    HTN (hypertension)    Internal hemorrhoids    Mitral regurgitation    Osteoarthritis    Overweight    Pure hypercholesterolemia 08/18/2014   Salmonella gastroenteritis      Allergies Allergies  Allergen Reactions   Altace [Ramipril] Cough     Medications  Current Outpatient Medications:    pantoprazole (PROTONIX) 40 MG tablet, Take 1 tablet (40 mg total) by mouth 2 (two) times daily., Disp: 60 tablet, Rfl: 0   [START ON 06/18/2023] pantoprazole (PROTONIX) 40 MG tablet, Take 1 tablet (40 mg total) by mouth daily., Disp: 30 tablet, Rfl: 3   aspirin-sod bicarb-citric acid (ALKA-SELTZER) 325 MG TBEF tablet, Take 325 mg by mouth at bedtime., Disp: , Rfl:    atorvastatin (LIPITOR) 40 MG tablet, TAKE 1 TABLET  BY MOUTH DAILY, Disp: 90 tablet, Rfl: 3   carvedilol (COREG) 6.25 MG tablet, TAKE 1 TABLET BY MOUTH TWICE  DAILY, Disp: 180 tablet, Rfl: 3   ferrous sulfate 325 (65 FE) MG tablet, Take 325 mg by mouth daily with breakfast., Disp: , Rfl:    gabapentin (NEURONTIN) 300 MG capsule, Take 300 mg by mouth 3 (three) times daily. Two tablets in the morning and 3 tablets in the evening and one tablet at bedtime., Disp: , Rfl:    HYDROcodone-acetaminophen (NORCO) 10-325 MG tablet, Take 1 tablet by mouth 4 (four) times daily as needed., Disp: , Rfl:    metFORMIN (GLUCOPHAGE) 500 MG tablet, Take 500 mg by mouth 2 (two) times daily. , Disp: , Rfl:    multivitamin (ONE-A-DAY MEN'S) TABS tablet, Take 1 tablet by mouth daily., Disp: , Rfl:    naloxone (NARCAN) nasal spray 4 mg/0.1 mL, as directed. Over dose, Disp: , Rfl:    nitroGLYCERIN  (NITROSTAT) 0.4 MG SL tablet, Place 1 tablet (0.4 mg total) under the tongue every 5 (five) minutes as needed for chest pain., Disp: 90 tablet, Rfl: 3   vitamin B-12 (CYANOCOBALAMIN) 1000 MCG tablet, Take 1,000 mcg by mouth daily., Disp: , Rfl:    Review of Systems ROS     Objective:    Vitals BP 134/88   Pulse 88   Temp 97.7 F (36.5 C)   Resp 18   Ht 5\' 9"  (1.753 m)   Wt 179 lb (81.2 kg)   SpO2 97%   BMI 26.43 kg/m    Physical Examination Physical Exam Constitutional:      Appearance: Normal appearance. He is not ill-appearing.  Cardiovascular:     Rate and Rhythm: Normal rate and regular rhythm.     Pulses: Normal pulses.     Heart sounds: No murmur heard.    No friction rub. No gallop.  Pulmonary:     Effort: Pulmonary effort is normal. No respiratory distress.     Breath sounds: No wheezing, rhonchi or rales.  Abdominal:     General: Abdomen is flat. Bowel sounds are normal. There is no distension.     Palpations: Abdomen is soft.     Tenderness: There is no abdominal tenderness.  Musculoskeletal:     Right lower leg: No edema.     Left lower leg: No edema.  Skin:    General: Skin is warm and dry.     Findings: No rash.  Neurological:     Mental Status: He is alert.        Assessment & Plan:   Epigastric pain The way he describes his pain, I think he has GERD with gastritis or possible ulcer.  I am concerned that he has elevated liver enzymes.  They did hepatitis testing on him at urgent care that was unremarkable.  I am going to repeat his CMP to look at his liver enzymes and we will get an abdominal xray.  Our next step will be a CT scan of his abd/pelvis.  I am going to start him on protonix 40mg  BID x 1 month and then once a day after that.  We will try to get him in sooner to see Dr. Marina Goodell.  I think he is going to need an EGD.    No follow-ups on file.   Crist Fat, MD

## 2023-05-20 NOTE — Assessment & Plan Note (Signed)
The way he describes his pain, I think he has GERD with gastritis or possible ulcer.  I am concerned that he has elevated liver enzymes.  They did hepatitis testing on him at urgent care that was unremarkable.  I am going to repeat his CMP to look at his liver enzymes and we will get an abdominal xray.  Our next step will be a CT scan of his abd/pelvis.  I am going to start him on protonix 40mg  BID x 1 month and then once a day after that.  We will try to get him in sooner to see Dr. Marina Goodell.  I think he is going to need an EGD.

## 2023-05-21 DIAGNOSIS — R197 Diarrhea, unspecified: Secondary | ICD-10-CM | POA: Diagnosis not present

## 2023-05-21 LAB — CMP14 + ANION GAP
ALT: 331 IU/L — ABNORMAL HIGH (ref 0–44)
AST: 127 IU/L — ABNORMAL HIGH (ref 0–40)
Albumin: 4.1 g/dL (ref 3.9–4.9)
Alkaline Phosphatase: 279 IU/L — ABNORMAL HIGH (ref 44–121)
Anion Gap: 16 mmol/L (ref 10.0–18.0)
BUN/Creatinine Ratio: 15 (ref 10–24)
BUN: 16 mg/dL (ref 8–27)
Bilirubin Total: 0.6 mg/dL (ref 0.0–1.2)
CO2: 24 mmol/L (ref 20–29)
Calcium: 9.8 mg/dL (ref 8.6–10.2)
Chloride: 99 mmol/L (ref 96–106)
Creatinine, Ser: 1.1 mg/dL (ref 0.76–1.27)
Globulin, Total: 2.6 g/dL (ref 1.5–4.5)
Glucose: 234 mg/dL — ABNORMAL HIGH (ref 70–99)
Potassium: 4.2 mmol/L (ref 3.5–5.2)
Sodium: 139 mmol/L (ref 134–144)
Total Protein: 6.7 g/dL (ref 6.0–8.5)
eGFR: 74 mL/min/{1.73_m2} (ref >59)

## 2023-05-23 ENCOUNTER — Other Ambulatory Visit: Payer: Self-pay | Admitting: Internal Medicine

## 2023-05-23 DIAGNOSIS — K760 Fatty (change of) liver, not elsewhere classified: Secondary | ICD-10-CM | POA: Diagnosis not present

## 2023-05-23 DIAGNOSIS — K838 Other specified diseases of biliary tract: Secondary | ICD-10-CM | POA: Diagnosis not present

## 2023-05-23 DIAGNOSIS — R1013 Epigastric pain: Secondary | ICD-10-CM | POA: Diagnosis not present

## 2023-05-23 DIAGNOSIS — R1084 Generalized abdominal pain: Secondary | ICD-10-CM

## 2023-05-23 DIAGNOSIS — M79645 Pain in left finger(s): Secondary | ICD-10-CM | POA: Diagnosis not present

## 2023-05-23 DIAGNOSIS — R1011 Right upper quadrant pain: Secondary | ICD-10-CM | POA: Diagnosis not present

## 2023-05-24 DIAGNOSIS — M1812 Unilateral primary osteoarthritis of first carpometacarpal joint, left hand: Secondary | ICD-10-CM | POA: Diagnosis not present

## 2023-05-25 NOTE — Progress Notes (Signed)
Call and tell him we are arranging an [place] of his liver. His labs are back. We are trying to get him setup for the CT scan of his abd/pelvis as well but needs an [place].   Patient is aware of labs

## 2023-05-26 ENCOUNTER — Encounter: Payer: Self-pay | Admitting: Internal Medicine

## 2023-05-26 ENCOUNTER — Ambulatory Visit: Payer: Medicare Other | Admitting: Internal Medicine

## 2023-05-26 VITALS — BP 138/88 | HR 78 | Temp 98.1°F | Resp 16 | Ht 69.0 in | Wt 177.0 lb

## 2023-05-26 DIAGNOSIS — E1142 Type 2 diabetes mellitus with diabetic polyneuropathy: Secondary | ICD-10-CM | POA: Diagnosis not present

## 2023-05-26 DIAGNOSIS — R7401 Elevation of levels of liver transaminase levels: Secondary | ICD-10-CM | POA: Diagnosis not present

## 2023-05-26 MED ORDER — OZEMPIC (0.25 OR 0.5 MG/DOSE) 2 MG/1.5ML ~~LOC~~ SOPN
0.2500 mg | PEN_INJECTOR | SUBCUTANEOUS | 0 refills | Status: DC
Start: 2023-05-26 — End: 2023-06-10

## 2023-05-26 NOTE — Assessment & Plan Note (Signed)
His protonix has helped his abdominal pain at this time but he still has a transaminitis.  We have him situated to have a CT scan of his abd/pelvis and he will followup with GI as scheduled.

## 2023-05-26 NOTE — Assessment & Plan Note (Signed)
We will check his HgBa1c today.  He wants to come off the metformin as he thinks some of his stomach issues is from metformin. I had a discussion with him about the new meds including injected and oral and how they can help with his diabetes, kidney function and cardiovascular effects.  I also discussed side effects.  He want to try ozempic.  I have warned him about nausea and vomiting.  We will start him on ozempic 0.25mg  subcut weekly.

## 2023-05-26 NOTE — Progress Notes (Signed)
Office Visit  Subjective   Patient ID: Karl Alvarez   DOB: 1956/01/31   Age: 67 y.o.   MRN: [id number]   Chief Complaint Chief Complaint  Patient presents with   Acute Visit    Diabetes Concerns     History of Present Illness Karl Alvarez is a 67 yo male who comes in today to discuss his T2 diabetes.  The patient was diagnosed with T2 diabetes in 2015.   He saw [name] in 01/2023 where his Hgba1c was 7.4%.  He was previously seen in the time before that were his A1c was 8%.  He tells me he had stopped his metformin at tha time but when his HgBA1 came back at 8%, he restarted.  Mr. [name] comes in today to discuss whether the metformin is causing his abdominal pain.  I saw him last week for epigastric pain that was burning and then it moved to his left side of his abdomen in his LUQ.  He states when he eats something, he will start having bloating and he will then have epigastric pain that will evolve into mid abdominal pain.  This pain will last 8 hours.  There has been no nausea or vomiting.  We did blood work that showed his liver enzymes were elevated where we performed a RUQ [place] on 05/23/2023 which showed a dilated common bile duct measuring 12 mm. There was no evidence of choledocholithiasis, obstructing mass, or intrahepatic biliary ductdilation.  There was hepatic steatosis.  I also obtained his blood work from urgent care where his Hepatitis B and C serologies were unremarkable.  We have scheduled him for a CT scan of his abd/pelvis which is going to be done on 05/31/2023.  We also have referred him to GI whom he sees on 05/30/2023.  I also started him on protonix in addition to his carafate his PCP had started him on a few weeks ago.  He states over the interim, his abdominal pain has improved.  In regards to his diabetes,  he does not really check his FSBS.  He does not exercise but he does watch what he eats.  He is currently on metformin 500mg  BID.  Today, he denies any nausea,  vomiting, or diarrhea.  He does have complications of pain in his feet with diabetic neuropathy where he is currently gabapentin 600mg  TID.  He has no diabetic nephropathy, retinopathy but he has had coronary artery disease where he had a MI with stent in 2015.  His last dilated yearly diabetic eye exam was done on  02/14/2023 by [facility] and this shows no evidence of diabetic retinopathy.        Past Medical History Past Medical History:  Diagnosis Date   Anxiety    CAD (coronary artery disease) 08/2014   Inferior STEMI with PCI/DES to RCA - well preserved LV function   Colon polyps    Diabetes (HCC)    DJD (degenerative joint disease)    Gastric polyps    History of BPH    HLD (hyperlipidemia)    HTN (hypertension)    Internal hemorrhoids    Mitral regurgitation    Osteoarthritis    Overweight    Pure hypercholesterolemia 08/18/2014   Salmonella gastroenteritis      Allergies Allergies  Allergen Reactions   Altace [Ramipril] Cough     Medications  Current Outpatient Medications:    aspirin-sod bicarb-citric acid (ALKA-SELTZER) 325 MG TBEF tablet, Take 325 mg by mouth  at bedtime., Disp: , Rfl:    atorvastatin (LIPITOR) 40 MG tablet, TAKE 1 TABLET BY MOUTH DAILY, Disp: 90 tablet, Rfl: 3   carvedilol (COREG) 6.25 MG tablet, TAKE 1 TABLET BY MOUTH TWICE  DAILY, Disp: 180 tablet, Rfl: 3   ferrous sulfate 325 (65 FE) MG tablet, Take 325 mg by mouth daily with breakfast., Disp: , Rfl:    gabapentin (NEURONTIN) 300 MG capsule, Take 300 mg by mouth 3 (three) times daily. Two tablets in the morning and 3 tablets in the evening and one tablet at bedtime., Disp: , Rfl:    HYDROcodone-acetaminophen (NORCO) 10-325 MG tablet, Take 1 tablet by mouth 4 (four) times daily as needed., Disp: , Rfl:    multivitamin (ONE-A-DAY MEN'S) TABS tablet, Take 1 tablet by mouth daily., Disp: , Rfl:    naloxone (NARCAN) nasal spray 4 mg/0.1 mL, as directed. Over dose, Disp: , Rfl:     nitroGLYCERIN (NITROSTAT) 0.4 MG SL tablet, Place 1 tablet (0.4 mg total) under the tongue every 5 (five) minutes as needed for chest pain., Disp: 90 tablet, Rfl: 3   pantoprazole (PROTONIX) 40 MG tablet, Take 1 tablet (40 mg total) by mouth 2 (two) times daily., Disp: 60 tablet, Rfl: 0   [START ON 06/18/2023] pantoprazole (PROTONIX) 40 MG tablet, Take 1 tablet (40 mg total) by mouth daily., Disp: 30 tablet, Rfl: 3   vitamin B-12 (CYANOCOBALAMIN) 1000 MCG tablet, Take 1,000 mcg by mouth daily., Disp: , Rfl:    Review of Systems Review of Systems  Constitutional:  Negative for chills and fever.  Eyes:  Negative for blurred vision and double vision.  Respiratory:  Negative for shortness of breath.   Cardiovascular:  Negative for chest pain, palpitations and leg swelling.  Gastrointestinal:  Positive for abdominal pain. Negative for blood in stool, constipation, diarrhea, melena, nausea and vomiting.       He says he has mild discomfort now.  Genitourinary:  Negative for frequency and hematuria.  Neurological:  Negative for dizziness, weakness and headaches.  Endo/Heme/Allergies:  Negative for polydipsia.       Objective:    Vitals BP 138/88   Pulse 78   Temp 98.1 F (36.7 C)   Resp 16   Ht 5\' 9"  (1.753 m)   Wt 177 lb (80.3 kg)   SpO2 98%   BMI 26.14 kg/m    Physical Examination Physical Exam Constitutional:      Appearance: Normal appearance. He is not ill-appearing.  Cardiovascular:     Rate and Rhythm: Normal rate and regular rhythm.     Pulses: Normal pulses.     Heart sounds: No murmur heard.    No friction rub. No gallop.  Pulmonary:     Effort: Pulmonary effort is normal. No respiratory distress.     Breath sounds: No wheezing, rhonchi or rales.  Abdominal:     General: Abdomen is flat. Bowel sounds are normal. There is no distension.     Palpations: Abdomen is soft.     Tenderness: There is no abdominal tenderness.  Musculoskeletal:     Right lower leg: No  edema.     Left lower leg: No edema.  Skin:    General: Skin is warm and dry.     Findings: No rash.  Neurological:     Mental Status: He is alert.        Assessment & Plan:   Diabetic polyneuropathy associated with type 2 diabetes mellitus (HCC) We will check his  HgBa1c today.  He wants to come off the metformin as he thinks some of his stomach issues is from metformin. I had a discussion with him about the new meds including injected and oral and how they can help with his diabetes, kidney function and cardiovascular effects.  I also discussed side effects.  He want to try ozempic.  I have warned him about nausea and vomiting.  We will start him on ozempic 0.25mg  subcut weekly.    Transaminitis His protonix has helped his abdominal pain at this time but he still has a transaminitis.  We have him situated to have a CT scan of his abd/pelvis and he will followup with GI as scheduled.    No follow-ups on file.   Crist Fat, MD

## 2023-05-27 LAB — HEMOGLOBIN A1C
Est. average glucose Bld gHb Est-mCnc: 194 mg/dL
Hgb A1c MFr Bld: 8.4 % — ABNORMAL HIGH (ref 4.8–5.6)

## 2023-05-27 LAB — MICROALBUMIN / CREATININE URINE RATIO
Creatinine, Urine: 121.1 mg/dL
Microalb/Creat Ratio: 8 mg/g creat (ref 0–29)
Microalbumin, Urine: 9.1 ug/mL

## 2023-05-30 ENCOUNTER — Other Ambulatory Visit: Payer: Self-pay | Admitting: Gastroenterology

## 2023-05-30 ENCOUNTER — Ambulatory Visit: Payer: Medicare Other | Admitting: Gastroenterology

## 2023-05-30 ENCOUNTER — Encounter: Payer: Self-pay | Admitting: Gastroenterology

## 2023-05-30 ENCOUNTER — Other Ambulatory Visit: Payer: Self-pay

## 2023-05-30 ENCOUNTER — Ambulatory Visit (HOSPITAL_COMMUNITY)
Admission: RE | Admit: 2023-05-30 | Discharge: 2023-05-30 | Disposition: A | Discharge status: Home / Self Care | Payer: Medicare Other | Source: Ambulatory Visit | Attending: Gastroenterology | Admitting: Gastroenterology

## 2023-05-30 ENCOUNTER — Other Ambulatory Visit (INDEPENDENT_AMBULATORY_CARE_PROVIDER_SITE_OTHER): Payer: Medicare Other

## 2023-05-30 VITALS — BP 122/68 | HR 84 | Ht 69.0 in | Wt 167.0 lb

## 2023-05-30 DIAGNOSIS — K838 Other specified diseases of biliary tract: Secondary | ICD-10-CM

## 2023-05-30 DIAGNOSIS — R7989 Other specified abnormal findings of blood chemistry: Secondary | ICD-10-CM

## 2023-05-30 DIAGNOSIS — R1013 Epigastric pain: Secondary | ICD-10-CM

## 2023-05-30 DIAGNOSIS — K828 Other specified diseases of gallbladder: Secondary | ICD-10-CM | POA: Diagnosis not present

## 2023-05-30 DIAGNOSIS — R9389 Abnormal findings on diagnostic imaging of other specified body structures: Secondary | ICD-10-CM

## 2023-05-30 DIAGNOSIS — K831 Obstruction of bile duct: Secondary | ICD-10-CM | POA: Diagnosis not present

## 2023-05-30 DIAGNOSIS — K8689 Other specified diseases of pancreas: Secondary | ICD-10-CM | POA: Diagnosis not present

## 2023-05-30 LAB — COMPREHENSIVE METABOLIC PANEL
ALT: 86 U/L — ABNORMAL HIGH (ref 0–53)
AST: 37 U/L (ref 0–37)
Albumin: 4.3 g/dL (ref 3.5–5.2)
Alkaline Phosphatase: 146 U/L — ABNORMAL HIGH (ref 39–117)
BUN: 24 mg/dL — ABNORMAL HIGH (ref 6–23)
CO2: 29 mEq/L (ref 19–32)
Calcium: 10.4 mg/dL (ref 8.4–10.5)
Chloride: 95 mEq/L — ABNORMAL LOW (ref 96–112)
Creatinine, Ser: 1.26 mg/dL (ref 0.40–1.50)
GFR: 58.85 mL/min — ABNORMAL LOW (ref >60.00)
Glucose, Bld: 426 mg/dL — ABNORMAL HIGH (ref 70–99)
Potassium: 5.5 mEq/L — ABNORMAL HIGH (ref 3.5–5.1)
Sodium: 132 mEq/L — ABNORMAL LOW (ref 135–145)
Total Bilirubin: 0.7 mg/dL (ref 0.2–1.2)
Total Protein: 7.8 g/dL (ref 6.0–8.3)

## 2023-05-30 LAB — CBC WITH DIFFERENTIAL/PLATELET
Basophils Absolute: 0.1 10*3/uL (ref 0.0–0.1)
Basophils Relative: 1.3 % (ref 0.0–3.0)
Eosinophils Absolute: 0.3 10*3/uL (ref 0.0–0.7)
Eosinophils Relative: 2.8 % (ref 0.0–5.0)
HCT: 46.3 % (ref 39.0–52.0)
Hemoglobin: 15.2 g/dL (ref 13.0–17.0)
Lymphocytes Relative: 22.3 % (ref 12.0–46.0)
Lymphs Abs: 2 10*3/uL (ref 0.7–4.0)
MCHC: 32.8 g/dL (ref 30.0–36.0)
MCV: 92 fl (ref 78.0–100.0)
Monocytes Absolute: 0.5 10*3/uL (ref 0.1–1.0)
Monocytes Relative: 5.5 % (ref 3.0–12.0)
Neutro Abs: 6.1 10*3/uL (ref 1.4–7.7)
Neutrophils Relative %: 68.1 % (ref 43.0–77.0)
Platelets: 359 10*3/uL (ref 150.0–400.0)
RBC: 5.03 Mil/uL (ref 4.22–5.81)
RDW: 13.1 % (ref 11.5–15.5)
WBC: 9 10*3/uL (ref 4.0–10.5)

## 2023-05-30 LAB — PROTIME-INR
INR: 1.1 ratio — ABNORMAL HIGH (ref 0.8–1.0)
Prothrombin Time: 11.9 s (ref 9.6–13.1)

## 2023-05-30 MED ORDER — GADOBUTROL 1 MMOL/ML IV SOLN
7.5000 mL | Freq: Once | INTRAVENOUS | Status: AC | PRN
  Administered 2023-05-30: 7.5 mL via INTRAVENOUS

## 2023-05-30 NOTE — H&P (View-Only) (Signed)
Chief Complaint: Epigastric pain and elevated LFTs Primary GI MD: Dr. Marina Goodell  HPI: 67 year old male with history CAD, on disability due to shoulder injuries, remote history of solid food dysphagia with transient food impactions with esophageal stricture noted on EGD but declined having therapeutic dilation, presents for evaluation of epigastric pain and elevated LFTs.  Patient states he had surgery on his hand the end of July 2024.  He was taking more pain medication than prescribed (acetaminophen based pain medication) and Alka-Seltzer.  He reports he developed epigastric pain that was severe and constant.  He then saw his PCP 05/20/2023.  Lab work 05/20/2023 AST 127/ALT 331/alk phos 279 Bilirubin 0.6  Patient then states he was set up for a right upper quadrant ultrasound that was performed 05/23/2023.  Results not available in epic as they were done at [facility] but he showed them to me on his phone. RUQ ultrasound 9/16: Dilated CBD measuring 12 mm.  Focal area of fatty sparing near the gallbladder fossa measuring up to 2.5 cm.  No evidence of choledocholithiasis, mass, or intrahepatic biliary duct dilation.  Hepatic steatosis.  No gallstones.  MRCP/ERCP recommended.  Patient notes he has had uncontrolled diabetes and at that same PCP visit he was started on Ozempic and he took his first dose last Thursday (9/19) he states he has not been able to eat any solid food since this dose due to severe abdominal pain.  He states his abdominal pain is improved and he is able to eat now.  He is scheduled for CT scan tomorrow  Denies previous history of pain similar to this.  Denies nausea/vomiting.  Denies change in bowel habits.  Denies melena/hematochezia.  Denies fever/chills.   PREVIOUS GI WORKUP   Has seen multiple GIs over the the years including Dr. Chales Abrahams in [place], Dr. Loman Chroman in [facility], and Dr. Marina Goodell EGD/colonoscopy 05/08/2013.  Upper endoscopy was unremarkable with minimal  changes on biopsies.  Colonoscopy revealed several colon polyps which were both edematous and hyperplastic.  Colonoscopy 08/11/2017 for screening - Entire examined colon is normal - No specimens collected - Repeat in 5 years (08/2022)  Past Medical History:  Diagnosis Date   Anxiety    CAD (coronary artery disease) 08/2014   Inferior STEMI with PCI/DES to RCA - well preserved LV function   Colon polyps    Diabetes (HCC)    DJD (degenerative joint disease)    Gastric polyps    History of BPH    HLD (hyperlipidemia)    HTN (hypertension)    Internal hemorrhoids    Mitral regurgitation    Osteoarthritis    Overweight    Pure hypercholesterolemia 08/18/2014   Salmonella gastroenteritis     Past Surgical History:  Procedure Laterality Date   LEFT HEART CATHETERIZATION WITH CORONARY ANGIOGRAM N/A 08/14/2014   Procedure: LEFT HEART CATHETERIZATION WITH CORONARY ANGIOGRAM;  Surgeon: Iran Ouch, MD;  Location: [facility] CATH [facility];  Service: Cardiovascular;  Laterality: N/A;    Current Outpatient Medications  Medication Sig Dispense Refill   aspirin-sod bicarb-citric acid (ALKA-SELTZER) 325 MG TBEF tablet Take 325 mg by mouth at bedtime.     atorvastatin (LIPITOR) 40 MG tablet TAKE 1 TABLET BY MOUTH DAILY 90 tablet 3   carvedilol (COREG) 6.25 MG tablet TAKE 1 TABLET BY MOUTH TWICE  DAILY 180 tablet 3   ferrous sulfate 325 (65 FE) MG tablet Take 325 mg by mouth daily with breakfast.     gabapentin (NEURONTIN) 300  MG capsule Take 300 mg by mouth 3 (three) times daily. Two tablets in the morning and 3 tablets in the evening and one tablet at bedtime.     HYDROcodone-acetaminophen (NORCO) 10-325 MG tablet Take 1 tablet by mouth 4 (four) times daily as needed.     multivitamin (ONE-A-DAY MEN'S) TABS tablet Take 1 tablet by mouth daily.     naloxone (NARCAN) nasal spray 4 mg/0.1 mL as directed. Over dose     nitroGLYCERIN (NITROSTAT) 0.4 MG SL tablet Place 1 tablet (0.4 mg total) under the  tongue every 5 (five) minutes as needed for chest pain. 90 tablet 3   pantoprazole (PROTONIX) 40 MG tablet Take 1 tablet (40 mg total) by mouth 2 (two) times daily. 60 tablet 0   [START ON 06/18/2023] pantoprazole (PROTONIX) 40 MG tablet Take 1 tablet (40 mg total) by mouth daily. 30 tablet 3   Semaglutide,0.25 or 0.5MG /DOS, (OZEMPIC, 0.25 OR 0.5 MG/DOSE,) 2 MG/1.5ML SOPN Inject 0.25 mg into the skin once a week. 1.5 mL 0   vitamin B-12 (CYANOCOBALAMIN) 1000 MCG tablet Take 1,000 mcg by mouth daily.     No current facility-administered medications for this visit.    Allergies as of 05/30/2023 - Review Complete 05/26/2023  Allergen Reaction Noted   Altace [ramipril] Cough 09/04/2014    Family History  Problem Relation Age of Onset   Hypertension Mother    Valvular heart disease Mother    Hypertension Brother     Social History   Socioeconomic History   Marital status: Married    Spouse name: Not on file   Number of children: Not on file   Years of education: Not on file   Highest education level: Not on file  Occupational History   Not on file  Tobacco Use   Smoking status: Former   Smokeless tobacco: Current    Types: Chew  Vaping Use   Vaping status: Never Used  Substance and Sexual Activity   Alcohol use: Not on file   Drug use: Not on file   Sexual activity: Not on file  Other Topics Concern   Not on file  Social History Narrative   Not on file   Social Determinants of Health   Financial Resource Strain: Low Risk  (12/25/2019)   Overall Financial Resource Strain (CARDIA)    Difficulty of Paying Living Expenses: Not hard at all  Food Insecurity: Low Risk  (05/12/2023)   Received from [facility]   Hunger Vital Sign    Worried About Running Out of Food in the Last Year: Never true    Ran Out of Food in the Last Year: Never true  Transportation Needs: No Transportation Needs (05/12/2023)   Received from [facility]    In the past 12 months,  has lack of reliable transportation kept you from medical appointments, meetings, work or from getting things needed for daily living? : No  Physical Activity: Insufficiently Active (12/25/2019)   Exercise Vital Sign    Days of Exercise per Week: 7 days    Minutes of Exercise per Session: 20 min  Stress: Stress Concern Present (12/25/2019)   [employer] of Occupational Health - Occupational Stress Questionnaire    Feeling of Stress : To some extent  Social Connections: Not on file  Intimate Partner Violence: Not on file    Review of Systems:    Constitutional: No weight loss, fever, chills, weakness or fatigue HEENT: Eyes: No change in vision  Ears, Nose, Throat:  No change in hearing or congestion Skin: No rash or itching Cardiovascular: No chest pain, chest pressure or palpitations   Respiratory: No SOB or cough Gastrointestinal: See HPI and otherwise negative Genitourinary: No dysuria or change in urinary frequency Neurological: No headache, dizziness or syncope Musculoskeletal: No new muscle or joint pain Hematologic: No bleeding or bruising Psychiatric: No history of depression or anxiety    Physical Exam:  Vital signs: There were no vitals taken for this visit.  Constitutional: NAD, Well developed, Well nourished, alert and cooperative Head:  Normocephalic and atraumatic. Eyes:   PEERL, EOMI. No icterus. Conjunctiva pink. Respiratory: Respirations even and unlabored. Lungs clear to auscultation bilaterally.   No wheezes, crackles, or rhonchi.  Cardiovascular:  Regular rate and rhythm. No peripheral edema, cyanosis or pallor.  Gastrointestinal:  Soft, nondistended, mild generalized tenderness. no rebound or guarding. Normal bowel sounds. No appreciable masses or hepatomegaly. Rectal:  Not performed.  Msk:  Symmetrical without gross deformities. Without edema, no deformity or joint abnormality.  Neurologic:  Alert and  oriented x4;  grossly normal  neurologically.  Skin:   Dry and intact without significant lesions or rashes. Psychiatric: Oriented to person, place and time. Demonstrates good judgement and reason without abnormal affect or behaviors.   RELEVANT LABS AND IMAGING: CBC    Component Value Date/Time   WBC 17.0 (H) 09/08/2017 1302   RBC 4.52 09/08/2017 1302   HGB 13.3 09/08/2017 1302   HCT 40.1 09/08/2017 1302   PLT 229 09/08/2017 1302   MCV 88.7 09/08/2017 1302   MCH 29.4 09/08/2017 1302   MCHC 33.2 09/08/2017 1302   RDW 12.9 09/08/2017 1302   LYMPHSABS 1.9 08/14/2014 1532   MONOABS 1.5 (H) 08/14/2014 1532   EOSABS 0.0 08/14/2014 1532   BASOSABS 0.0 08/14/2014 1532    CMP     Component Value Date/Time   NA 139 05/20/2023 0953   K 4.2 05/20/2023 0953   CL 99 05/20/2023 0953   CO2 24 05/20/2023 0953   GLUCOSE 234 (H) 05/20/2023 0953   GLUCOSE 135 (H) 09/08/2017 1302   BUN 16 05/20/2023 0953   CREATININE 1.10 05/20/2023 0953   CALCIUM 9.8 05/20/2023 0953   PROT 6.7 05/20/2023 0953   ALBUMIN 4.1 05/20/2023 0953   AST 127 (H) 05/20/2023 0953   ALT 331 (H) 05/20/2023 0953   ALKPHOS 279 (H) 05/20/2023 0953   BILITOT 0.6 05/20/2023 0953   GFRNONAA 88 10/07/2020 1450   GFRAA 102 10/07/2020 1450     Assessment/Plan:   67 year old male presents after having severe epigastric pain starting in the beginning of August after taking pain medication/Alka-Seltzer s/p hand surgery and becoming more progressive with elevated LFTs noted 9/13, ultrasound with dilated CBD of 12 mm without definitive evidence of choledocholithiasis, no gallstones.  New medication (Ozempic) taken 9/19 and now symptoms appear to be resolving  Elevated LFTs Abnormal ultrasound Dilated cbd, acquired Epigastric abdominal pain Multifactorial.  Will need to rule out etiology of dilated CBD.  Suspect Ozempic did not help things although it was started after his finding of elevated LFTs.  Could have had choledocholithiasis that has passed as  well.  No evidence of jaundice on exam and symptoms have resolved. -CMP, CBC - Viral hepatitis panel, alpha-1 antitrypsin deficiency, ceruloplasmin, PT/INR, iron studies - Stat MRCP for evaluation of dilated CBD.  Symptoms are resolving at this point which is reassuring but we still need to evaluate - If recurrence or worsening symptoms would recommend  proceeding to ED - Management and follow-up based on imaging  History of colon polyps Due for repeat colonoscopy in 2023.  We will set him up for colonoscopy after the above is managed.  Lara Mulch [facility] 05/30/2023, 12:54 PM  Cc: Crist Fat, MD

## 2023-05-30 NOTE — Progress Notes (Signed)
Chief Complaint: Epigastric pain and elevated LFTs Primary GI MD: Dr. Marina Goodell  HPI: 67 year old male with history CAD, on disability due to shoulder injuries, remote history of solid food dysphagia with transient food impactions with esophageal stricture noted on EGD but declined having therapeutic dilation, presents for evaluation of epigastric pain and elevated LFTs.  Patient states he had surgery on his hand the end of July 2024.  He was taking more pain medication than prescribed (acetaminophen based pain medication) and Alka-Seltzer.  He reports he developed epigastric pain that was severe and constant.  He then saw his PCP 05/20/2023.  Lab work 05/20/2023 AST 127/ALT 331/alk phos 279 Bilirubin 0.6  Patient then states he was set up for a right upper quadrant ultrasound that was performed 05/23/2023.  Results not available in epic as they were done at [facility] but he showed them to me on his phone. RUQ ultrasound 9/16: Dilated CBD measuring 12 mm.  Focal area of fatty sparing near the gallbladder fossa measuring up to 2.5 cm.  No evidence of choledocholithiasis, mass, or intrahepatic biliary duct dilation.  Hepatic steatosis.  No gallstones.  MRCP/ERCP recommended.  Patient notes he has had uncontrolled diabetes and at that same PCP visit he was started on Ozempic and he took his first dose last Thursday (9/19) he states he has not been able to eat any solid food since this dose due to severe abdominal pain.  He states his abdominal pain is improved and he is able to eat now.  He is scheduled for CT scan tomorrow  Denies previous history of pain similar to this.  Denies nausea/vomiting.  Denies change in bowel habits.  Denies melena/hematochezia.  Denies fever/chills.   PREVIOUS GI WORKUP   Has seen multiple GIs over the the years including Dr. Chales Abrahams in [place], Dr. Loman Chroman in [facility], and Dr. Marina Goodell EGD/colonoscopy 05/08/2013.  Upper endoscopy was unremarkable with minimal  changes on biopsies.  Colonoscopy revealed several colon polyps which were both edematous and hyperplastic.  Colonoscopy 08/11/2017 for screening - Entire examined colon is normal - No specimens collected - Repeat in 5 years (08/2022)  Past Medical History:  Diagnosis Date   Anxiety    CAD (coronary artery disease) 08/2014   Inferior STEMI with PCI/DES to RCA - well preserved LV function   Colon polyps    Diabetes (HCC)    DJD (degenerative joint disease)    Gastric polyps    History of BPH    HLD (hyperlipidemia)    HTN (hypertension)    Internal hemorrhoids    Mitral regurgitation    Osteoarthritis    Overweight    Pure hypercholesterolemia 08/18/2014   Salmonella gastroenteritis     Past Surgical History:  Procedure Laterality Date   LEFT HEART CATHETERIZATION WITH CORONARY ANGIOGRAM N/A 08/14/2014   Procedure: LEFT HEART CATHETERIZATION WITH CORONARY ANGIOGRAM;  Surgeon: Iran Ouch, MD;  Location: [facility] CATH [facility];  Service: Cardiovascular;  Laterality: N/A;    Current Outpatient Medications  Medication Sig Dispense Refill   aspirin-sod bicarb-citric acid (ALKA-SELTZER) 325 MG TBEF tablet Take 325 mg by mouth at bedtime.     atorvastatin (LIPITOR) 40 MG tablet TAKE 1 TABLET BY MOUTH DAILY 90 tablet 3   carvedilol (COREG) 6.25 MG tablet TAKE 1 TABLET BY MOUTH TWICE  DAILY 180 tablet 3   ferrous sulfate 325 (65 FE) MG tablet Take 325 mg by mouth daily with breakfast.     gabapentin (NEURONTIN) 300  MG capsule Take 300 mg by mouth 3 (three) times daily. Two tablets in the morning and 3 tablets in the evening and one tablet at bedtime.     HYDROcodone-acetaminophen (NORCO) 10-325 MG tablet Take 1 tablet by mouth 4 (four) times daily as needed.     multivitamin (ONE-A-DAY MEN'S) TABS tablet Take 1 tablet by mouth daily.     naloxone (NARCAN) nasal spray 4 mg/0.1 mL as directed. Over dose     nitroGLYCERIN (NITROSTAT) 0.4 MG SL tablet Place 1 tablet (0.4 mg total) under the  tongue every 5 (five) minutes as needed for chest pain. 90 tablet 3   pantoprazole (PROTONIX) 40 MG tablet Take 1 tablet (40 mg total) by mouth 2 (two) times daily. 60 tablet 0   [START ON 06/18/2023] pantoprazole (PROTONIX) 40 MG tablet Take 1 tablet (40 mg total) by mouth daily. 30 tablet 3   Semaglutide,0.25 or 0.5MG /DOS, (OZEMPIC, 0.25 OR 0.5 MG/DOSE,) 2 MG/1.5ML SOPN Inject 0.25 mg into the skin once a week. 1.5 mL 0   vitamin B-12 (CYANOCOBALAMIN) 1000 MCG tablet Take 1,000 mcg by mouth daily.     No current facility-administered medications for this visit.    Allergies as of 05/30/2023 - Review Complete 05/26/2023  Allergen Reaction Noted   Altace [ramipril] Cough 09/04/2014    Family History  Problem Relation Age of Onset   Hypertension Mother    Valvular heart disease Mother    Hypertension Brother     Social History   Socioeconomic History   Marital status: Married    Spouse name: Not on file   Number of children: Not on file   Years of education: Not on file   Highest education level: Not on file  Occupational History   Not on file  Tobacco Use   Smoking status: Former   Smokeless tobacco: Current    Types: Chew  Vaping Use   Vaping status: Never Used  Substance and Sexual Activity   Alcohol use: Not on file   Drug use: Not on file   Sexual activity: Not on file  Other Topics Concern   Not on file  Social History Narrative   Not on file   Social Determinants of Health   Financial Resource Strain: Low Risk  (12/25/2019)   Overall Financial Resource Strain (CARDIA)    Difficulty of Paying Living Expenses: Not hard at all  Food Insecurity: Low Risk  (05/12/2023)   Received from [facility]   Hunger Vital Sign    Worried About Running Out of Food in the Last Year: Never true    Ran Out of Food in the Last Year: Never true  Transportation Needs: No Transportation Needs (05/12/2023)   Received from [facility]    In the past 12 months,  has lack of reliable transportation kept you from medical appointments, meetings, work or from getting things needed for daily living? : No  Physical Activity: Insufficiently Active (12/25/2019)   Exercise Vital Sign    Days of Exercise per Week: 7 days    Minutes of Exercise per Session: 20 min  Stress: Stress Concern Present (12/25/2019)   [employer] of Occupational Health - Occupational Stress Questionnaire    Feeling of Stress : To some extent  Social Connections: Not on file  Intimate Partner Violence: Not on file    Review of Systems:    Constitutional: No weight loss, fever, chills, weakness or fatigue HEENT: Eyes: No change in vision  Ears, Nose, Throat:  No change in hearing or congestion Skin: No rash or itching Cardiovascular: No chest pain, chest pressure or palpitations   Respiratory: No SOB or cough Gastrointestinal: See HPI and otherwise negative Genitourinary: No dysuria or change in urinary frequency Neurological: No headache, dizziness or syncope Musculoskeletal: No new muscle or joint pain Hematologic: No bleeding or bruising Psychiatric: No history of depression or anxiety    Physical Exam:  Vital signs: There were no vitals taken for this visit.  Constitutional: NAD, Well developed, Well nourished, alert and cooperative Head:  Normocephalic and atraumatic. Eyes:   PEERL, EOMI. No icterus. Conjunctiva pink. Respiratory: Respirations even and unlabored. Lungs clear to auscultation bilaterally.   No wheezes, crackles, or rhonchi.  Cardiovascular:  Regular rate and rhythm. No peripheral edema, cyanosis or pallor.  Gastrointestinal:  Soft, nondistended, mild generalized tenderness. no rebound or guarding. Normal bowel sounds. No appreciable masses or hepatomegaly. Rectal:  Not performed.  Msk:  Symmetrical without gross deformities. Without edema, no deformity or joint abnormality.  Neurologic:  Alert and  oriented x4;  grossly normal  neurologically.  Skin:   Dry and intact without significant lesions or rashes. Psychiatric: Oriented to person, place and time. Demonstrates good judgement and reason without abnormal affect or behaviors.   RELEVANT LABS AND IMAGING: CBC    Component Value Date/Time   WBC 17.0 (H) 09/08/2017 1302   RBC 4.52 09/08/2017 1302   HGB 13.3 09/08/2017 1302   HCT 40.1 09/08/2017 1302   PLT 229 09/08/2017 1302   MCV 88.7 09/08/2017 1302   MCH 29.4 09/08/2017 1302   MCHC 33.2 09/08/2017 1302   RDW 12.9 09/08/2017 1302   LYMPHSABS 1.9 08/14/2014 1532   MONOABS 1.5 (H) 08/14/2014 1532   EOSABS 0.0 08/14/2014 1532   BASOSABS 0.0 08/14/2014 1532    CMP     Component Value Date/Time   NA 139 05/20/2023 0953   K 4.2 05/20/2023 0953   CL 99 05/20/2023 0953   CO2 24 05/20/2023 0953   GLUCOSE 234 (H) 05/20/2023 0953   GLUCOSE 135 (H) 09/08/2017 1302   BUN 16 05/20/2023 0953   CREATININE 1.10 05/20/2023 0953   CALCIUM 9.8 05/20/2023 0953   PROT 6.7 05/20/2023 0953   ALBUMIN 4.1 05/20/2023 0953   AST 127 (H) 05/20/2023 0953   ALT 331 (H) 05/20/2023 0953   ALKPHOS 279 (H) 05/20/2023 0953   BILITOT 0.6 05/20/2023 0953   GFRNONAA 88 10/07/2020 1450   GFRAA 102 10/07/2020 1450     Assessment/Plan:   67 year old male presents after having severe epigastric pain starting in the beginning of August after taking pain medication/Alka-Seltzer s/p hand surgery and becoming more progressive with elevated LFTs noted 9/13, ultrasound with dilated CBD of 12 mm without definitive evidence of choledocholithiasis, no gallstones.  New medication (Ozempic) taken 9/19 and now symptoms appear to be resolving  Elevated LFTs Abnormal ultrasound Dilated cbd, acquired Epigastric abdominal pain Multifactorial.  Will need to rule out etiology of dilated CBD.  Suspect Ozempic did not help things although it was started after his finding of elevated LFTs.  Could have had choledocholithiasis that has passed as  well.  No evidence of jaundice on exam and symptoms have resolved. -CMP, CBC - Viral hepatitis panel, alpha-1 antitrypsin deficiency, ceruloplasmin, PT/INR, iron studies - Stat MRCP for evaluation of dilated CBD.  Symptoms are resolving at this point which is reassuring but we still need to evaluate - If recurrence or worsening symptoms would recommend  proceeding to ED - Management and follow-up based on imaging  History of colon polyps Due for repeat colonoscopy in 2023.  We will set him up for colonoscopy after the above is managed.  Lara Mulch [facility] 05/30/2023, 12:54 PM  Cc: Crist Fat, MD

## 2023-05-30 NOTE — Patient Instructions (Signed)
Your provider has requested that you go to the basement level for lab work before leaving today. Press "B" on the elevator. The lab is located at the first door on the left as you exit the elevator.  You have been scheduled for an MRCP at [facility] on 9/23. Your appointment time is 7:30. Please arrive to admitting (at main entrance of the hospital) 30 minutes prior to your appointment time for registration purposes. Please make certain not to have anything to eat or drink 4 hours prior to your test. In addition, if you have any metal in your body, have a pacemaker or defibrillator, please be sure to let your ordering physician know. This test typically takes 45 minutes to 1 hour to complete. Should you need to reschedule, please call [phone] to do so.  You may take any medications as prescribed with a small amount of water, if necessary. If you take any of the following medications: METFORMIN, GLUCOPHAGE, GLUCOVANCE, AVANDAMET, RIOMET, FORTAMET, ACTOPLUS MET, JANUMET, GLUMETZA or METAGLIP, you MAY be asked to HOLD this medication 48 hours AFTER the exam.   The purpose of you drinking the oral contrast is to aid in the visualization of your intestinal tract. The contrast solution may cause some diarrhea. Depending on your individual set of symptoms, you may also receive an intravenous injection of x-ray contrast/dye. Plan on being at [facility] for 45 minutes or longer, depending on the type of exam you are having performed.   If you have any questions regarding your exam or if you need to reschedule, you may call [facility] at [phone] between the hours of 8:00 am and 5:00 pm, Monday-Friday.     If your blood pressure at your visit was 140/90 or greater, please contact your primary care physician to follow up on this.  _______________________________________________________  If you are age 68 or older, your body mass index should be between 23-30. Your Body mass index  is 24.66 kg/m. If this is out of the aforementioned range listed, please consider follow up with your Primary Care Provider.  If you are age 33 or younger, your body mass index should be between 19-25. Your Body mass index is 24.66 kg/m. If this is out of the aformentioned range listed, please consider follow up with your Primary Care Provider.   ________________________________________________________  The  GI providers would like to encourage you to use [facility] to communicate with providers for non-urgent requests or questions.  Due to long hold times on the telephone, sending your provider a message by [facility] may be a faster and more efficient way to get a response.  Please allow 48 business hours for a response.  Please remember that this is for non-urgent requests.  _______________________________________________________  Due to recent changes in healthcare laws, you may see the results of your imaging and laboratory studies on MyChart before your provider has had a chance to review them.  We understand that in some cases there may be results that are confusing or concerning to you. Not all laboratory results come back in the same time frame and the provider may be waiting for multiple results in order to interpret others.  Please give [place] 48 hours in order for your provider to thoroughly review all the results before contacting the office for clarification of your results.    Thank you for trusting me with your gastrointestinal care!   Boone Master, PA

## 2023-05-31 ENCOUNTER — Encounter: Payer: Self-pay | Admitting: *Deleted

## 2023-05-31 ENCOUNTER — Other Ambulatory Visit: Payer: Self-pay

## 2023-05-31 ENCOUNTER — Telehealth: Payer: Self-pay

## 2023-05-31 ENCOUNTER — Other Ambulatory Visit: Payer: Self-pay | Admitting: *Deleted

## 2023-05-31 DIAGNOSIS — K869 Disease of pancreas, unspecified: Secondary | ICD-10-CM

## 2023-05-31 DIAGNOSIS — R933 Abnormal findings on diagnostic imaging of other parts of digestive tract: Secondary | ICD-10-CM

## 2023-05-31 DIAGNOSIS — M79645 Pain in left finger(s): Secondary | ICD-10-CM | POA: Diagnosis not present

## 2023-05-31 LAB — HEPATITIS A ANTIBODY, TOTAL: Hepatitis A AB,Total: NONREACTIVE

## 2023-05-31 LAB — HEPATITIS C ANTIBODY: Hepatitis C Ab: NONREACTIVE

## 2023-05-31 LAB — CERULOPLASMIN: Ceruloplasmin: 31 mg/dL — ABNORMAL HIGH (ref 14–30)

## 2023-05-31 LAB — HEPATITIS B SURFACE ANTIGEN: Hepatitis B Surface Ag: NONREACTIVE

## 2023-05-31 LAB — HEPATITIS B CORE ANTIBODY, TOTAL: Hep B Core Total Ab: NONREACTIVE

## 2023-05-31 LAB — HEPATITIS B SURFACE ANTIBODY,QUALITATIVE: Hep B S Ab: NONREACTIVE

## 2023-05-31 NOTE — Telephone Encounter (Signed)
The pt has agreed to the EUS on 06/02/23 at [facility] with GM last does of Ozempic was 9/19. He does state that he will no longer be taking due to GI complaints.   He has been instructed and all information has been sent to My Chart per pt request

## 2023-05-31 NOTE — Progress Notes (Signed)
[place], Laboratories and MRI/MRCP reviewed. 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  Liver tests have improved.  No jaundice. Please discuss with the patient. The next step is EUS with FNA.  I will include Dr. Meridee Score, for his review, understanding the constraints of his schedule. Thanks, Dr. Marina Goodell

## 2023-05-31 NOTE — Telephone Encounter (Signed)
-----   Message from [facility] sent at 05/31/2023  4:08 PM EDT ----- Alexia Freestone, Please see if this patient can come in on Thursday for an Upper EUS to evaluate the pancreatic lesion noted on recent MRI/MRCP. I have already spoken with Endo and Anesthesia, even if he has taken his Ozempic, we can move forward with procedure and just be prepared for possible GA rather than just MAC, but I think it would be ideal to try and do his procedure if he is available. Let us know. Thanks. ----- Message ----- From: Hilarie Fredrickson, MD Sent: 05/31/2023  12:57 PM EDT To: Legrand Como, PA-C; #     ----- Message ----- From: Legrand Como, PA-C Sent: 05/30/2023   2:25 PM EDT To: Hilarie Fredrickson, MD

## 2023-05-31 NOTE — Progress Notes (Signed)
Received a call from Diane with [facility] regarding patient MR/MRCP completed yesterday. She asks that [facility], PA-C be advised to urgently look at results now displayed in EPIC due to concerning pancreatic mass. I have spoken to [facility] and advised her of this information.

## 2023-06-01 ENCOUNTER — Other Ambulatory Visit: Payer: Self-pay

## 2023-06-01 ENCOUNTER — Encounter: Payer: Self-pay | Admitting: Internal Medicine

## 2023-06-01 ENCOUNTER — Ambulatory Visit: Payer: Medicare Other | Admitting: Internal Medicine

## 2023-06-01 VITALS — BP 132/78 | HR 111 | Temp 98.9°F | Resp 20 | Ht 69.0 in | Wt 175.6 lb

## 2023-06-01 DIAGNOSIS — R7989 Other specified abnormal findings of blood chemistry: Secondary | ICD-10-CM | POA: Diagnosis not present

## 2023-06-01 DIAGNOSIS — C250 Malignant neoplasm of head of pancreas: Secondary | ICD-10-CM | POA: Diagnosis not present

## 2023-06-01 DIAGNOSIS — D519 Vitamin B12 deficiency anemia, unspecified: Secondary | ICD-10-CM | POA: Diagnosis not present

## 2023-06-01 DIAGNOSIS — C259 Malignant neoplasm of pancreas, unspecified: Secondary | ICD-10-CM | POA: Diagnosis not present

## 2023-06-01 DIAGNOSIS — K8689 Other specified diseases of pancreas: Secondary | ICD-10-CM

## 2023-06-01 DIAGNOSIS — E1142 Type 2 diabetes mellitus with diabetic polyneuropathy: Secondary | ICD-10-CM | POA: Diagnosis not present

## 2023-06-01 MED ORDER — METFORMIN HCL ER 500 MG PO TB24: 1000.0000 mg | ORAL_TABLET | Freq: Two times a day (BID) | ORAL | Status: DC

## 2023-06-01 MED ORDER — NOVOLOG FLEXPEN 100 UNIT/ML ~~LOC~~ SOPN: 5.0000 [IU] | PEN_INJECTOR | Freq: Three times a day (TID) | SUBCUTANEOUS | 11 refills | Status: DC

## 2023-06-01 NOTE — Progress Notes (Signed)
Office Visit  Subjective   Patient ID: Karl Alvarez   DOB: 04-09-1956   Age: 67 y.o.   MRN: [id number]   Chief Complaint Chief Complaint  Patient presents with   Acute Visit    Consult     History of Present Illness [name] is a 67 yo male who comes in today to discuss his T2 diabetes and his GI results.  He went in to see GI on 05/30/2023 where they wanted him to have a MRI of his abd/pelvis done.  It was done on the same day and this showed a small hypoenhancing, diffusion restricting mass within the ventral pancreatic head measuring 1.6 x 1.3 cm, highly suspicious for pancreatic adenocarcinoma.  There was mild common bile duct dilatation, measuring up to 1.0 cm in caliber, as well as mild intrahepatic biliary ductal dilatation.  Common bile duct is obstructed within the pancreatic head by mass.  The pancreatic duct is effaced just above the ampulla. There is however no pancreatic ductal dilatation or surrounding inflammatory changes. There is no evidence of lymphadenopathy or metastatic disease in the abdomen.  There is hepatic steatosis.      .  The patient was diagnosed with T2 diabetes in 2015.   He saw [name] in 01/2023 where his Hgba1c was 7.4%.  He was previously seen in the time before that were his A1c was 8%.  He tells me he had stopped his metformin at tha time but when his HgBA1 came back at 8%, he restarted.  Mr. [name] comes in today to discuss whether the metformin is causing his abdominal pain.  I saw him last week for epigastric pain that was burning and then it moved to his left side of his abdomen in his LUQ.  He states when he eats something, he will start having bloating and he will then have epigastric pain that will evolve into mid abdominal pain.  This pain will last 8 hours.  There has been no nausea or vomiting.  We did blood work that showed his liver enzymes were elevated where we performed a RUQ [place] on 05/23/2023 which showed a dilated common bile  duct measuring 12 mm. There was no evidence of choledocholithiasis, obstructing mass, or intrahepatic biliary ductdilation.  There was hepatic steatosis.  I also obtained his blood work from urgent care where his Hepatitis B and C serologies were unremarkable.  We have scheduled him for a CT scan of his abd/pelvis which is going to be done on 05/31/2023.  We also have referred him to GI whom he sees on 05/30/2023.  I also started him on protonix in addition to his carafate his PCP had started him on a few weeks ago.  He states over the interim, his abdominal pain has improved.  In regards to his diabetes,  he does not really check his FSBS.  He does not exercise but he does watch what he eats.  He is currently on metformin 500mg  BID.  Today, he denies any nausea, vomiting, or diarrhea.  He does have complications of pain in his feet with diabetic neuropathy where he is currently gabapentin 600mg  TID.  He has no diabetic nephropathy, retinopathy but he has had coronary artery disease where he had a MI with stent in 2015.  His last dilated yearly diabetic eye exam was done on  02/14/2023 by [facility] and this shows no evidence of diabetic retinopathy.       Past Medical History Past  Medical History:  Diagnosis Date   Anxiety    CAD (coronary artery disease) 08/2014   Inferior STEMI with PCI/DES to RCA - well preserved LV function   Colon polyps    Diabetes (HCC)    DJD (degenerative joint disease)    Gastric polyps    History of BPH    HLD (hyperlipidemia)    HTN (hypertension)    Internal hemorrhoids    Mitral regurgitation    Osteoarthritis    Overweight    Pure hypercholesterolemia 08/18/2014   Salmonella gastroenteritis      Allergies Allergies  Allergen Reactions   Altace [Ramipril] Cough     Medications  Current Outpatient Medications:    atorvastatin (LIPITOR) 40 MG tablet, TAKE 1 TABLET BY MOUTH DAILY, Disp: 90 tablet, Rfl: 3   carvedilol (COREG) 6.25 MG tablet,  TAKE 1 TABLET BY MOUTH TWICE  DAILY, Disp: 180 tablet, Rfl: 3   ferrous sulfate 325 (65 FE) MG tablet, Take 325 mg by mouth daily with breakfast., Disp: , Rfl:    gabapentin (NEURONTIN) 300 MG capsule, Take 300 mg by mouth 3 (three) times daily. Two tablets in the morning and 3 tablets in the evening and one tablet at bedtime., Disp: , Rfl:    multivitamin (ONE-A-DAY MEN'S) TABS tablet, Take 1 tablet by mouth daily., Disp: , Rfl:    naloxone (NARCAN) nasal spray 4 mg/0.1 mL, as directed. Over dose, Disp: , Rfl:    nitroGLYCERIN (NITROSTAT) 0.4 MG SL tablet, Place 1 tablet (0.4 mg total) under the tongue every 5 (five) minutes as needed for chest pain., Disp: 90 tablet, Rfl: 3   pantoprazole (PROTONIX) 40 MG tablet, Take 1 tablet (40 mg total) by mouth 2 (two) times daily., Disp: 60 tablet, Rfl: 0   [START ON 06/18/2023] pantoprazole (PROTONIX) 40 MG tablet, Take 1 tablet (40 mg total) by mouth daily., Disp: 30 tablet, Rfl: 3   Semaglutide,0.25 or 0.5MG /DOS, (OZEMPIC, 0.25 OR 0.5 MG/DOSE,) 2 MG/1.5ML SOPN, Inject 0.25 mg into the skin once a week., Disp: 1.5 mL, Rfl: 0   vitamin B-12 (CYANOCOBALAMIN) 1000 MCG tablet, Take 1,000 mcg by mouth daily., Disp: , Rfl:    Review of Systems Review of Systems  Constitutional:  Negative for chills and fever.  Respiratory:  Negative for cough and shortness of breath.   Gastrointestinal:  Negative for abdominal pain, blood in stool, constipation, diarrhea, nausea and vomiting.  Genitourinary:  Positive for frequency.  Neurological:  Positive for dizziness. Negative for focal weakness and headaches.       Objective:    Vitals BP 122/74   Pulse 89   Temp 97.9 F (36.6 C)   Resp 18   Ht 5\' 9"  (1.753 m)   Wt 175 lb 9.6 oz (79.7 kg)   SpO2 99%   BMI 25.93 kg/m    Physical Examination Physical Exam Constitutional:      Appearance: Normal appearance. He is not ill-appearing.  Cardiovascular:     Rate and Rhythm: Normal rate and regular rhythm.      Pulses: Normal pulses.     Heart sounds: No murmur heard.    No friction rub. No gallop.  Pulmonary:     Effort: Pulmonary effort is normal. No respiratory distress.     Breath sounds: No wheezing, rhonchi or rales.  Abdominal:     General: Abdomen is flat. Bowel sounds are normal. There is no distension.     Palpations: Abdomen is soft.  Tenderness: There is no abdominal tenderness.  Musculoskeletal:     Right lower leg: No edema.     Left lower leg: No edema.  Skin:    General: Skin is warm and dry.     Findings: No rash.  Neurological:     Mental Status: He is alert.        Assessment & Plan:   Diabetic polyneuropathy associated with type 2 diabetes mellitus (HCC) His diabetes is elevated at home with FSBS of 300-400.  I want him to stay off the ozempic.  We will restart metformin ER and increase him to 1000mg  BID.  I want him to take this medicine this morning and this evening and hold tomorrow for his procedure as he is NPO after midnight.    Pancreatic mass They are going to do an EUS/EGD tomorrow.  They are requesting some additional lab work and he wants it drawn here in [place] including an IBC, ferritin and a CA-19-9.      No follow-ups on file.   Crist Fat, MD

## 2023-06-01 NOTE — Assessment & Plan Note (Signed)
They are going to do an EUS/EGD tomorrow.  They are requesting some additional lab work and he wants it drawn here in [place] including an IBC, ferritin and a CA-19-9.

## 2023-06-01 NOTE — Assessment & Plan Note (Signed)
His diabetes is elevated at home with FSBS of 300-400.  I want him to stay off the ozempic.  We will restart metformin ER and increase him to 1000mg  BID.  I want him to take this medicine this morning and this evening and hold tomorrow for his procedure as he is NPO after midnight.

## 2023-06-02 ENCOUNTER — Encounter (HOSPITAL_COMMUNITY)
Admission: RE | Disposition: A | Discharge status: Home / Self Care | Payer: Self-pay | Source: Home / Self Care | Attending: Gastroenterology

## 2023-06-02 ENCOUNTER — Ambulatory Visit (HOSPITAL_BASED_OUTPATIENT_CLINIC_OR_DEPARTMENT_OTHER): Payer: Medicare Other | Admitting: Anesthesiology

## 2023-06-02 ENCOUNTER — Other Ambulatory Visit: Payer: Self-pay

## 2023-06-02 ENCOUNTER — Encounter (HOSPITAL_COMMUNITY): Payer: Self-pay | Admitting: Gastroenterology

## 2023-06-02 ENCOUNTER — Ambulatory Visit (HOSPITAL_COMMUNITY): Payer: Medicare Other | Admitting: Anesthesiology

## 2023-06-02 ENCOUNTER — Ambulatory Visit (HOSPITAL_COMMUNITY)
Admission: RE | Admit: 2023-06-02 | Discharge: 2023-06-02 | Disposition: A | Discharge status: Home / Self Care | Payer: Medicare Other | Attending: Gastroenterology | Admitting: Gastroenterology

## 2023-06-02 ENCOUNTER — Ambulatory Visit (HOSPITAL_COMMUNITY): Payer: Medicare Other

## 2023-06-02 DIAGNOSIS — I10 Essential (primary) hypertension: Secondary | ICD-10-CM | POA: Diagnosis not present

## 2023-06-02 DIAGNOSIS — C250 Malignant neoplasm of head of pancreas: Secondary | ICD-10-CM | POA: Diagnosis not present

## 2023-06-02 DIAGNOSIS — F17290 Nicotine dependence, other tobacco product, uncomplicated: Secondary | ICD-10-CM

## 2023-06-02 DIAGNOSIS — R933 Abnormal findings on diagnostic imaging of other parts of digestive tract: Secondary | ICD-10-CM | POA: Diagnosis not present

## 2023-06-02 DIAGNOSIS — Z79899 Other long term (current) drug therapy: Secondary | ICD-10-CM | POA: Diagnosis not present

## 2023-06-02 DIAGNOSIS — K2970 Gastritis, unspecified, without bleeding: Secondary | ICD-10-CM

## 2023-06-02 DIAGNOSIS — I252 Old myocardial infarction: Secondary | ICD-10-CM | POA: Diagnosis not present

## 2023-06-02 DIAGNOSIS — I899 Noninfective disorder of lymphatic vessels and lymph nodes, unspecified: Secondary | ICD-10-CM | POA: Insufficient documentation

## 2023-06-02 DIAGNOSIS — K869 Disease of pancreas, unspecified: Secondary | ICD-10-CM | POA: Diagnosis not present

## 2023-06-02 DIAGNOSIS — I2510 Atherosclerotic heart disease of native coronary artery without angina pectoris: Secondary | ICD-10-CM | POA: Diagnosis not present

## 2023-06-02 DIAGNOSIS — K2289 Other specified disease of esophagus: Secondary | ICD-10-CM | POA: Insufficient documentation

## 2023-06-02 DIAGNOSIS — Z87891 Personal history of nicotine dependence: Secondary | ICD-10-CM | POA: Insufficient documentation

## 2023-06-02 DIAGNOSIS — R7989 Other specified abnormal findings of blood chemistry: Secondary | ICD-10-CM

## 2023-06-02 DIAGNOSIS — E8801 Alpha-1-antitrypsin deficiency: Secondary | ICD-10-CM | POA: Diagnosis not present

## 2023-06-02 DIAGNOSIS — K838 Other specified diseases of biliary tract: Secondary | ICD-10-CM

## 2023-06-02 DIAGNOSIS — Z7985 Long-term (current) use of injectable non-insulin antidiabetic drugs: Secondary | ICD-10-CM | POA: Diagnosis not present

## 2023-06-02 DIAGNOSIS — K8689 Other specified diseases of pancreas: Secondary | ICD-10-CM

## 2023-06-02 DIAGNOSIS — K760 Fatty (change of) liver, not elsewhere classified: Secondary | ICD-10-CM | POA: Diagnosis not present

## 2023-06-02 DIAGNOSIS — Z794 Long term (current) use of insulin: Secondary | ICD-10-CM | POA: Diagnosis not present

## 2023-06-02 DIAGNOSIS — E119 Type 2 diabetes mellitus without complications: Secondary | ICD-10-CM | POA: Diagnosis not present

## 2023-06-02 DIAGNOSIS — K2950 Unspecified chronic gastritis without bleeding: Secondary | ICD-10-CM | POA: Diagnosis not present

## 2023-06-02 DIAGNOSIS — K3189 Other diseases of stomach and duodenum: Secondary | ICD-10-CM | POA: Insufficient documentation

## 2023-06-02 DIAGNOSIS — I878 Other specified disorders of veins: Secondary | ICD-10-CM | POA: Diagnosis not present

## 2023-06-02 DIAGNOSIS — R918 Other nonspecific abnormal finding of lung field: Secondary | ICD-10-CM | POA: Diagnosis not present

## 2023-06-02 DIAGNOSIS — K2990 Gastroduodenitis, unspecified, without bleeding: Secondary | ICD-10-CM | POA: Diagnosis not present

## 2023-06-02 DIAGNOSIS — R079 Chest pain, unspecified: Secondary | ICD-10-CM | POA: Diagnosis not present

## 2023-06-02 HISTORY — PX: EUS: SHX5427

## 2023-06-02 HISTORY — PX: BIOPSY: SHX5522

## 2023-06-02 HISTORY — PX: FINE NEEDLE ASPIRATION: SHX5430

## 2023-06-02 HISTORY — PX: ESOPHAGOGASTRODUODENOSCOPY (EGD) WITH PROPOFOL: SHX5813

## 2023-06-02 LAB — CANCER ANTIGEN 19-9: CA 19-9: 182 U/mL — ABNORMAL HIGH (ref 0–35)

## 2023-06-02 LAB — IRON AND TIBC
Iron Saturation: 27 % (ref 15–55)
Iron: 82 ug/dL (ref 38–169)
Total Iron Binding Capacity: 301 ug/dL (ref 250–450)
UIBC: 219 ug/dL (ref 111–343)

## 2023-06-02 LAB — GLUCOSE, CAPILLARY
Glucose-Capillary: 227 mg/dL — ABNORMAL HIGH (ref 70–99)
Glucose-Capillary: 215 mg/dL — ABNORMAL HIGH (ref 70–99)

## 2023-06-02 SURGERY — ESOPHAGOGASTRODUODENOSCOPY (EGD) WITH PROPOFOL
Anesthesia: General

## 2023-06-02 MED ORDER — PROPOFOL 1000 MG/100ML IV EMUL
INTRAVENOUS | Status: AC
  Filled 2023-06-02: qty 100

## 2023-06-02 MED ORDER — PROPOFOL 10 MG/ML IV BOLUS
INTRAVENOUS | Status: DC | PRN
  Administered 2023-06-02: 10 mg via INTRAVENOUS
  Administered 2023-06-02: 120 mg via INTRAVENOUS

## 2023-06-02 MED ORDER — PROPOFOL 500 MG/50ML IV EMUL
INTRAVENOUS | Status: DC | PRN
  Administered 2023-06-02: 125 ug/kg/min via INTRAVENOUS

## 2023-06-02 MED ORDER — SODIUM CHLORIDE 0.9 % IV SOLN: INTRAVENOUS | Status: DC

## 2023-06-02 MED ORDER — PANTOPRAZOLE SODIUM 40 MG PO TBEC: 40.0000 mg | DELAYED_RELEASE_TABLET | Freq: Two times a day (BID) | ORAL | Status: AC

## 2023-06-02 MED ORDER — LACTATED RINGERS IV SOLN: INTRAVENOUS | Status: DC

## 2023-06-02 MED ORDER — PROPOFOL 500 MG/50ML IV EMUL
INTRAVENOUS | Status: AC
  Filled 2023-06-02: qty 50

## 2023-06-02 MED ORDER — SUCCINYLCHOLINE CHLORIDE 200 MG/10ML IV SOSY
PREFILLED_SYRINGE | INTRAVENOUS | Status: DC | PRN
Start: 2023-06-02 — End: 2023-06-02
  Administered 2023-06-02: 100 mg via INTRAVENOUS

## 2023-06-02 MED ORDER — FENTANYL CITRATE (PF) 100 MCG/2ML IJ SOLN
INTRAMUSCULAR | Status: AC
  Filled 2023-06-02: qty 2

## 2023-06-02 MED ORDER — PHENYLEPHRINE 80 MCG/ML (10ML) SYRINGE FOR IV PUSH (FOR BLOOD PRESSURE SUPPORT)
PREFILLED_SYRINGE | INTRAVENOUS | Status: DC | PRN
Start: 2023-06-02 — End: 2023-06-02
  Administered 2023-06-02 (×3): 160 ug via INTRAVENOUS

## 2023-06-02 MED ORDER — FENTANYL CITRATE (PF) 100 MCG/2ML IJ SOLN
25.0000 ug | Freq: Once | INTRAMUSCULAR | Status: AC
  Administered 2023-06-02: 25 ug via INTRAVENOUS

## 2023-06-02 MED ORDER — FENTANYL CITRATE (PF) 100 MCG/2ML IJ SOLN
INTRAMUSCULAR | Status: DC | PRN
  Administered 2023-06-02: 50 ug via INTRAVENOUS

## 2023-06-02 MED ORDER — LIDOCAINE 2% (20 MG/ML) 5 ML SYRINGE
INTRAMUSCULAR | Status: DC | PRN
  Administered 2023-06-02: 60 mg via INTRAVENOUS

## 2023-06-02 NOTE — Op Note (Signed)
[facility] Patient Name: Karl Alvarez Procedure Date: 06/02/2023 MRN: [id number] Attending MD: Corliss Parish , MD, [id number] Date of Birth: 08-14-1956 CSN: [id number] Age: 67 Admit Type: Outpatient Procedure:                Upper EUS Indications:              Suspected mass in pancreas on MRCP Providers:                Corliss Parish, MD, Fransisca Connors, Stephens Shire RN, RN, Beryle Beams, Technician, Harrington Challenger, Technician, Leroy Libman, CRNA, Maricela Curet, CRNA Referring MD:             Wilhemina Bonito. Marina Goodell, MD, [place]ley M. Darreld Mclean, MD, Baron Sane Eyk Medicines:                Monitored Anesthesia Care transitioned to General                            Anesthesia due to foodstuffs in the stomach Complications:            No immediate complications. Estimated Blood Loss:     Estimated blood loss was minimal. Procedure:                Pre-Anesthesia Assessment:                           - Prior to the procedure, a History and Physical                            was performed, and patient medications and                            allergies were reviewed. The patient's tolerance of                            previous anesthesia was also reviewed. The risks                            and benefits of the procedure and the sedation                            options and risks were discussed with the patient.                            All questions were answered, and informed consent  was obtained. Prior Anticoagulants: The patient has                            taken no anticoagulant or antiplatelet agents                            except for aspirin. ASA Grade Assessment: III - A                            patient with severe systemic disease. After                            reviewing the risks and benefits, the patient  was                            deemed in satisfactory condition to undergo the                            procedure.                           After obtaining informed consent, the endoscope was                            passed under direct vision. Throughout the                            procedure, the patient's blood pressure, pulse, and                            oxygen saturations were monitored continuously. The                            [id number]-H190 ([id number]) Olympus endoscope was introduced                            through the mouth, and advanced to the second part                            of duodenum. The [id number]-[id number] ([id number]) Olympus                            duodenoscope was introduced through the mouth, and                            advanced to the area of papilla. The [id number]                            ([id number]) Olympus linear ultrasound scope was                            introduced through the mouth, and advanced to the  duodenum for ultrasound examination from the                            stomach and duodenum. The upper EUS was                            accomplished without difficulty. The patient                            tolerated the procedure. Scope In: Scope Out: Findings:      ENDOSCOPIC FINDING: :      No gross lesions were noted in the entire esophagus.      The Z-line was irregular and was found 38 cm from the incisors.      A large amount of food (residue) was found in the entire examined       stomach.      Patchy mildly erythematous mucosa without bleeding was found in the       entire examined stomach. Biopsies were taken with a cold forceps for       histology and Helicobacter pylori testing.      No gross lesions were noted in the duodenal bulb, in the first portion       of the duodenum and in the second portion of the duodenum.      The major papilla was normal but hidden under a hood.      ENDOSONOGRAPHIC FINDING:  :      An irregular lesion was identified in the pancreatic head where the CBD       and PD began to dilate. The area was heterogenous but there was a       portion that was more hypoechoic. The hypoechoic area measured 16 mm by       20 mm in maximal cross-sectional diameter. The endosonographic borders       were poorly-defined however. An intact interface was seen between the       lesion and the superior mesenteric artery, celiac trunk and portal vein       suggesting a lack of invasion. The remainder of the pancreas was       examined. The endosonographic appearance of parenchyma and the upstream       pancreatic duct indicated some ductal dilation in the head but more       regular appearance within the rest of the pancreas (PDH = 5.2 mm, PDN =       2.4 mm, PDB = 1.5 mm, PDT = 1.0 mm) with mild parenchymal atrophy. Fine       needle biopsy was performed of the hypoechoic lesion/area. Color Doppler       imaging was utilized prior to needle puncture to confirm a lack of       significant vascular structures within the needle path. Six passes were       made with the 22 gauge SharkCore biopsy needle using a transduodenal       approach. A visible core of tissue was obtained. Preliminary cytologic       examination and touch preps were performed. The cellularity of the       specimen was adequate. Final cytology results are pending.      There was dilation in the common bile duct (8.8 mm) and in the common  hepatic duct (12.7 mm).      Moderate hyperechoic material consistent with sludge was visualized       endosonographically in the gallbladder.      Endosonographic imaging of the ampulla showed no intramural       (subepithelial) lesion.      No malignant-appearing lymph nodes were visualized in the celiac region       (level 20), peripancreatic region and porta hepatis region.      Endosonographic imaging in the visualized portion of the liver showed no       mass.      The  celiac region was visualized. Impression:               EGD impression:                           - No gross lesions in the entire esophagus. Z-line                            irregular, 38 cm from the incisors.                           - A large amount of food (residue) in the stomach.                           - Erythematous mucosa in the stomach. Biopsied.                           - No gross lesions in the duodenal bulb, in the                            first portion of the duodenum and in the second                            portion of the duodenum.                           - Normal major papilla, hidden under a hood.                           EUS impression:                           -Heterogenous lesion with some hypoechogenicity was                            identified in the pancreatic head, where the CBD                            and PD began to dilate. Cytology results are                            pending. However, the endosonographic appearance is                            suspicious for adenocarcinoma. This was staged T2  N0 Mx by endosonographic criteria. The staging                            applies if malignancy is confirmed. Fine needle                            biopsy performed.                           - There was dilation in the common bile duct and in                            the common hepatic duct.                           - Hyperechoic material consistent with sludge was                            visualized endosonographically in the gallbladder.                           - No malignant-appearing lymph nodes were                            visualized in the celiac region (level 20),                            peripancreatic region and porta hepatis region. Moderate Sedation:      Not Applicable - Patient had care per Anesthesia. Recommendation:           - The patient will be observed post-procedure,                             until all discharge criteria are met.                           - Discharge patient to home.                           - Patient has a contact number available for                            emergencies. The signs and symptoms of potential                            delayed complications were discussed with the                            patient. Return to normal activities tomorrow.                            Written discharge instructions were provided to the                            patient.                           -  Low fat diet.                           - Observe patient's clinical course.                           - Monitor for signs/symptoms of bleeding,                            perforation, and infection. If issues please call                            our number to get further assistance as needed.                           - Await cytology results and await path results.                           - Continue to work with PCP in regards to                            management of hyperglycemia.                           - If malignancy is identified, referrals to                            oncology and surgical oncology will be pursued as                            well as a CT chest/abdomen/pelvis.                           - The findings and recommendations were discussed                            with the patient.                           - The findings and recommendations were discussed                            with the patient's family. Procedure Code(s):        --- Professional ---                           [phone], Esophagogastroduodenoscopy, flexible,                            transoral; with transendoscopic ultrasound-guided                            intramural or transmural fine needle                            aspiration/biopsy(s), (includes endoscopic  ultrasound examination limited to the esophagus,                             stomach or duodenum, and adjacent structures)                           43239, 59, Esophagogastroduodenoscopy, flexible,                            transoral; with biopsy, single or multiple Diagnosis Code(s):        --- Professional ---                           K22.89, Other specified disease of esophagus                           K31.89, Other diseases of stomach and duodenum                           K86.89, Other specified diseases of pancreas                           K83.8, Other specified diseases of biliary tract                           I89.9, Noninfective disorder of lymphatic vessels                            and lymph nodes, unspecified                           R93.3, Abnormal findings on diagnostic imaging of                            other parts of digestive tract CPT copyright 2022 American Medical Association. All rights reserved. The codes documented in this report are preliminary and upon coder review may  be revised to meet current compliance requirements. Corliss Parish, MD 06/02/2023 12:51:06 PM Number of Addenda: 0

## 2023-06-02 NOTE — Interval H&P Note (Signed)
History and Physical Interval Note:  06/02/2023 11:15 AM  Karl Alvarez  has presented today for surgery, with the diagnosis of pancreatic lesion.  The various methods of treatment have been discussed with the patient and family. After consideration of risks, benefits and other options for treatment, the patient has consented to  Procedure(s): UPPER ENDOSCOPIC ULTRASOUND (EUS) RADIAL (N/A) as a surgical intervention.  The patient's history has been reviewed, patient examined, no change in status, stable for surgery.  I have reviewed the patient's chart and labs.  Questions were answered to the patient's satisfaction.     The risks of an EUS including intestinal perforation, bleeding, infection, aspiration, and medication effects were discussed as was the possibility it may not give a definitive diagnosis if a biopsy is performed.  When a biopsy of the pancreas is done as part of the EUS, there is an additional risk of pancreatitis at the rate of about 1-2%.  It was explained that procedure related pancreatitis is typically mild, although it can be severe and even life threatening, which is why we do not perform random pancreatic biopsies and only biopsy a lesion/area we feel is concerning enough to warrant the risk.    [employer]

## 2023-06-02 NOTE — Anesthesia Procedure Notes (Signed)
Procedure Name: MAC Date/Time: 06/02/2023 11:23 AM  Performed by: Orest Dikes, CRNAPre-anesthesia Checklist: Patient identified, Emergency Drugs available, Suction available and Patient being monitored Oxygen Delivery Method: Simple face mask

## 2023-06-02 NOTE — Transfer of Care (Signed)
Immediate Anesthesia Transfer of Care Note  Patient: Karl Alvarez  Procedure(s) Performed: UPPER ENDOSCOPIC ULTRASOUND (EUS) LINEAR ESOPHAGOGASTRODUODENOSCOPY (EGD) WITH PROPOFOL BIOPSY FINE NEEDLE ASPIRATION (FNA) LINEAR  Patient Location: PACU  Anesthesia Type:General  Level of Consciousness: awake and alert   Airway & Oxygen Therapy: Patient Spontanous Breathing and Patient connected to face mask oxygen  Post-op Assessment: Report given to RN and Post -op Vital signs reviewed and stable  Post vital signs: Reviewed and stable  Last Vitals:  Vitals Value Taken Time  BP    Temp    Pulse 74 06/02/23 1241  Resp 9 06/02/23 1241  SpO2 100 % 06/02/23 1241  Vitals shown include unfiled device data.  Last Pain:  Vitals:   06/02/23 1049  TempSrc: Temporal  PainSc: 0-No pain         Complications: No notable events documented.

## 2023-06-02 NOTE — Progress Notes (Addendum)
Patient had been evaluated postprocedural recovery area and was doing fine. I had spoken with the patient's family as well. We have gone over the results. He was to be discharged, but then expressed that he was having some subxiphoid chest discomfort 4-5 out of 10. He received a dose of fentanyl 25 mcg and an EKG was performed. Chest x-ray and KUB were performed as well. Subsequently patient had improvement in his subxiphoid chest discomfort/pain and was just feeling more of a sensation while swallowing. Suspect that this patient had less likelihood for pancreatitis but we waited for a few hours until we could get final reads of chest x-ray and KUB. Patient was able to be discharged after the results were reviewed. Patient was aware and family is aware that if he has progressive abdominal pain or discomfort he should call us overnight in case he were to have postprocedural pancreatitis, if that were to be the case, I would suspect he would have had a progressive persistent pain and he only required 25 mcg of fentanyl during his 4 hours of postoperative recovery. Patient also did state that over the course the last 3 to 4 weeks he has had intermittent episodes of the same type of discomfort that have come and gone although the last week he had not had that. Will have my team send in a GI cocktail tomorrow that he may use as needed 2-3 times per day. Hopefully will have the biopsy results tomorrow or early next week.   Corliss Parish, MD [facility] Office # [phone]

## 2023-06-02 NOTE — Anesthesia Procedure Notes (Signed)
Procedure Name: Intubation Date/Time: 06/02/2023 11:46 AM  Performed by: Florene Route, CRNAPre-anesthesia Checklist: Patient identified, Emergency Drugs available, Suction available and Patient being monitored Patient Re-evaluated:Patient Re-evaluated prior to induction Oxygen Delivery Method: Circle system utilized Preoxygenation: Pre-oxygenation with 100% oxygen Induction Type: IV induction, Rapid sequence and Cricoid Pressure applied Laryngoscope Size: 2 Grade View: Grade I Tube type: Oral Tube size: 7.5 mm Number of attempts: 1 Airway Equipment and Method: Stylet Placement Confirmation: ETT inserted through vocal cords under direct vision, positive ETCO2 and breath sounds checked- equal and bilateral Secured at: 22 cm Tube secured with: Tape Dental Injury: Teeth and Oropharynx as per pre-operative assessment

## 2023-06-02 NOTE — Discharge Instructions (Signed)

## 2023-06-02 NOTE — Anesthesia Preprocedure Evaluation (Signed)
Anesthesia Evaluation  Patient identified by MRN, date of birth, ID band Patient awake    Reviewed: Allergy & Precautions, NPO status , Patient's Chart, lab work & pertinent test results  Airway Mallampati: II  TM Distance: >3 FB Neck ROM: Full    Dental  (+) Dental Advisory Given   Pulmonary former smoker   breath sounds clear to auscultation       Cardiovascular hypertension, Pt. on medications and Pt. on home beta blockers + CAD, + Past MI and + Cardiac Stents  + dysrhythmias  Rhythm:Regular Rate:Normal     Neuro/Psych  Neuromuscular disease    GI/Hepatic negative GI ROS, Neg liver ROS,,,  Endo/Other  diabetes, Type 2, Insulin Dependent    Renal/GU negative Renal ROS     Musculoskeletal  (+) Arthritis ,    Abdominal   Peds  Hematology negative hematology ROS (+)   Anesthesia Other Findings   Reproductive/Obstetrics                             Anesthesia Physical Anesthesia Plan  ASA: 3  Anesthesia Plan: MAC   Post-op Pain Management:    Induction:   PONV Risk Score and Plan: 1 and Propofol infusion and Ondansetron  Airway Management Planned: Natural Airway and Nasal Cannula  Additional Equipment:   Intra-op Plan:   Post-operative Plan:   Informed Consent: I have reviewed the patients History and Physical, chart, labs and discussed the procedure including the risks, benefits and alternatives for the proposed anesthesia with the patient or authorized representative who has indicated his/her understanding and acceptance.       Plan Discussed with: CRNA  Anesthesia Plan Comments:        Anesthesia Quick Evaluation

## 2023-06-03 ENCOUNTER — Telehealth: Payer: Self-pay

## 2023-06-03 LAB — SURGICAL PATHOLOGY

## 2023-06-03 MED ORDER — LIDOCAINE VISCOUS HCL 2 % MT SOLN: 30.0000 mL | Freq: Four times a day (QID) | OROMUCOSAL | 0 refills | Status: DC

## 2023-06-03 NOTE — Anesthesia Postprocedure Evaluation (Signed)
Anesthesia Post Note  Patient: Karl Alvarez  Procedure(s) Performed: UPPER ENDOSCOPIC ULTRASOUND (EUS) LINEAR ESOPHAGOGASTRODUODENOSCOPY (EGD) WITH PROPOFOL BIOPSY FINE NEEDLE ASPIRATION (FNA) LINEAR     Patient location during evaluation: PACU Anesthesia Type: General Level of consciousness: awake and alert Pain management: pain level controlled Vital Signs Assessment: post-procedure vital signs reviewed and stable Respiratory status: spontaneous breathing, nonlabored ventilation, respiratory function stable and patient connected to nasal cannula oxygen Cardiovascular status: blood pressure returned to baseline and stable Postop Assessment: no apparent nausea or vomiting Anesthetic complications: no  No notable events documented.  Last Vitals:  Vitals:   06/02/23 1430 06/02/23 1445  BP: (!) 151/78   Pulse: 73 73  Resp: 12 17  Temp:    SpO2: 100% 100%    Last Pain:  Vitals:   06/02/23 1430  TempSrc:   PainSc: 0-No pain                 Kennieth Rad

## 2023-06-03 NOTE — Telephone Encounter (Signed)
-----   Message from [facility] sent at 06/02/2023  5:38 PM EDT ----- Regarding: Follow-up Karl Alvarez, Please send into this patient's pharmacy a GI cocktail to be used 2-3 times daily. Please use are normal GI cocktail. GM

## 2023-06-03 NOTE — Telephone Encounter (Signed)
Prescription has been sent as ordered

## 2023-06-04 ENCOUNTER — Encounter: Payer: Self-pay | Admitting: Gastroenterology

## 2023-06-05 ENCOUNTER — Encounter (HOSPITAL_COMMUNITY): Payer: Self-pay | Admitting: Gastroenterology

## 2023-06-06 ENCOUNTER — Other Ambulatory Visit: Payer: Self-pay

## 2023-06-06 DIAGNOSIS — C259 Malignant neoplasm of pancreas, unspecified: Secondary | ICD-10-CM

## 2023-06-08 NOTE — Progress Notes (Signed)
Ive already reviewed his pathology. His CA-19-9 is elevated.   Patient is aware of labs

## 2023-06-09 ENCOUNTER — Ambulatory Visit (HOSPITAL_COMMUNITY)
Admission: RE | Admit: 2023-06-09 | Discharge: 2023-06-09 | Disposition: A | Discharge status: Home / Self Care | Payer: Medicare Other | Source: Ambulatory Visit | Attending: Gastroenterology | Admitting: Gastroenterology

## 2023-06-09 DIAGNOSIS — K8689 Other specified diseases of pancreas: Secondary | ICD-10-CM | POA: Diagnosis not present

## 2023-06-09 DIAGNOSIS — M51362 Other intervertebral disc degeneration, lumbar region with discogenic back pain and lower extremity pain: Secondary | ICD-10-CM | POA: Diagnosis not present

## 2023-06-09 DIAGNOSIS — C259 Malignant neoplasm of pancreas, unspecified: Secondary | ICD-10-CM | POA: Diagnosis not present

## 2023-06-09 DIAGNOSIS — G893 Neoplasm related pain (acute) (chronic): Secondary | ICD-10-CM | POA: Diagnosis not present

## 2023-06-09 DIAGNOSIS — Z5181 Encounter for therapeutic drug level monitoring: Secondary | ICD-10-CM | POA: Diagnosis not present

## 2023-06-09 DIAGNOSIS — Z79891 Long term (current) use of opiate analgesic: Secondary | ICD-10-CM | POA: Diagnosis not present

## 2023-06-09 DIAGNOSIS — N281 Cyst of kidney, acquired: Secondary | ICD-10-CM | POA: Diagnosis not present

## 2023-06-09 DIAGNOSIS — M25541 Pain in joints of right hand: Secondary | ICD-10-CM | POA: Diagnosis not present

## 2023-06-09 DIAGNOSIS — K831 Obstruction of bile duct: Secondary | ICD-10-CM | POA: Diagnosis not present

## 2023-06-09 MED ORDER — IOHEXOL 300 MG/ML  SOLN
100.0000 mL | Freq: Once | INTRAMUSCULAR | Status: AC | PRN
  Administered 2023-06-09: 100 mL via INTRAVENOUS

## 2023-06-10 ENCOUNTER — Encounter: Payer: Self-pay | Admitting: Internal Medicine

## 2023-06-10 ENCOUNTER — Ambulatory Visit: Payer: Medicare Other | Admitting: Internal Medicine

## 2023-06-10 VITALS — BP 128/74 | HR 85 | Temp 98.1°F | Resp 16 | Ht 69.0 in | Wt 177.4 lb

## 2023-06-10 DIAGNOSIS — E1165 Type 2 diabetes mellitus with hyperglycemia: Secondary | ICD-10-CM | POA: Diagnosis not present

## 2023-06-10 DIAGNOSIS — C250 Malignant neoplasm of head of pancreas: Secondary | ICD-10-CM

## 2023-06-10 MED ORDER — LIDOCAINE VISCOUS HCL 2 % MT SOLN: 30.0000 mL | Freq: Four times a day (QID) | OROMUCOSAL | 0 refills | Status: DC

## 2023-06-10 MED ORDER — INSULIN GLARGINE 100 UNITS/ML SOLOSTAR PEN: 7.0000 [IU] | PEN_INJECTOR | Freq: Every day | SUBCUTANEOUS | 11 refills | Status: DC

## 2023-06-10 NOTE — Progress Notes (Signed)
Office Visit  Subjective   Patient ID: Karl Alvarez   DOB: 01/19/1956   Age: 67 y.o.   MRN: [id number]   Chief Complaint Chief Complaint  Patient presents with   Acute Visit    Consult     History of Present Illness Karl Alvarez is a 67 yo male who comes in today to discuss his recent diagnosis of pancreatic cancer.  He went in to see GI on 05/30/2023 where they wanted him to have a MRI of his abd/pelvis done.  It was done on the same day and this showed a small hypoenhancing, diffusion restricting mass within the ventral pancreatic head measuring 1.6 x 1.3 cm, highly suspicious for pancreatic adenocarcinoma.  There was mild common bile duct dilatation, measuring up to 1.0 cm in caliber, as well as mild intrahepatic biliary ductal dilatation.  Common bile duct is obstructed within the pancreatic head by mass.  The pancreatic duct is effaced just above the ampulla. There is however no pancreatic ductal dilatation or surrounding inflammatory changes. There was no evidence of lymphadenopathy or metastatic disease in the abdomen.  There was hepatic steatosis.  He did go for an EUS/EGD on 06/02/2023 that showed no lesions in the esophagus , stomach, or lesions in the duodenal bulb, in the first portion of the duodenum and in the second portion of the duodenum.  There was a heterogenous lesion with some hypoechogenicity was identified in the pancreatic head, where the CBD and PD began to dilate. The endosonographic appearance was suspicious for adenocarcinoma. This was staged T2 N0 Mx by endosonographic criteria. Fine needle biopsy was performed.  There was dilation in the common bile duct and in the common hepatic duct.  No malignant-appearing lymph nodes were visualized in the celiac region (level 20), peripancreatic region and porta hepatis region.  His pathology came back for pancreatic cancer.  His GI doctor has set him up to see a surgeon on 10/14 and he goes to see oncology afterwards this  month.  Karl Alvarez also had a CT scan of his abdomen/pelvis for cancer staging done yesterday but the images have not been read yet.  He was having abdominal pain that had worsened where he saw his pain management doctors yesterday.   They have placed him on a trial of MS Contin 15 mg once daily, which he plans to use at nighttime. They increased his immediate release oxycodone to 10 mg every 8 hours as needed for severe breakthrough pain. He is also prescribed gabapentin through his PCP.    The patient also has a history of T2 diabetes where he was having elevated blood sugars before his EUS.  I had a discusion with his GI doctors before the procedure and he was not on metformin at that time.  I started him on novolog 5 Units TID before meals.  He states that his FSBS was running near 200 when he was on this but over the last few days his FSBS are now down around 100.  Again, he was diagnosed with T2 diabetes in 2015.   He saw [name] in 01/2023 where his Hgba1c was 7.4%.  He was previously seen in the time before that were his A1c was 8%.  He tells me he had stopped his metformin at that time but when his HgBA1 came back at 8%, he restarted.  His HgBA1c was 8.4% on 05/26/2023.  He is checking his FSBS 2-3 times per day.  Past Medical History Past Medical History:  Diagnosis Date   Anxiety    CAD (coronary artery disease) 08/2014   Inferior STEMI with PCI/DES to RCA - well preserved LV function   Colon polyps    Diabetes (HCC)    DJD (degenerative joint disease)    Gastric polyps    History of BPH    HLD (hyperlipidemia)    HTN (hypertension)    Internal hemorrhoids    Mitral regurgitation    Osteoarthritis    Overweight    Pure hypercholesterolemia 08/18/2014   Salmonella gastroenteritis      Allergies Allergies  Allergen Reactions   Altace [Ramipril] Cough     Medications  Current Outpatient Medications:    insulin glargine (LANTUS) 100 unit/mL SOPN, Inject 7 Units  into the skin daily., Disp: 3 mL, Rfl: 11   atorvastatin (LIPITOR) 40 MG tablet, TAKE 1 TABLET BY MOUTH DAILY, Disp: 90 tablet, Rfl: 3   carvedilol (COREG) 6.25 MG tablet, TAKE 1 TABLET BY MOUTH TWICE  DAILY, Disp: 180 tablet, Rfl: 3   ferrous sulfate 325 (65 FE) MG tablet, Take 325 mg by mouth daily with breakfast., Disp: , Rfl:    gabapentin (NEURONTIN) 300 MG capsule, Take 300 mg by mouth 3 (three) times daily. Two tablets in the morning and 3 tablets in the evening and one tablet at bedtime., Disp: , Rfl:    GI Cocktail (alum & mag hydroxide, lidocaine, dicyclomine) oral mixture, Take 30 mLs by mouth 4 (four) times daily. Swish and swallow, Disp: 550 mL, Rfl: 0   insulin aspart (NOVOLOG FLEXPEN) 100 UNIT/ML FlexPen, Inject 5 Units into the skin 3 (three) times daily with meals. Take 20 minutes before each meal., Disp: 15 mL, Rfl: 11   losartan (COZAAR) 50 MG tablet, Take 50 mg by mouth daily., Disp: , Rfl:    metFORMIN (GLUCOPHAGE-XR) 500 MG 24 hr tablet, Take 2 tablets (1,000 mg total) by mouth 2 (two) times daily with a meal., Disp: , Rfl:    multivitamin (ONE-A-DAY MEN'S) TABS tablet, Take 1 tablet by mouth daily., Disp: , Rfl:    naloxone (NARCAN) nasal spray 4 mg/0.1 mL, as directed. Over dose, Disp: , Rfl:    nitroGLYCERIN (NITROSTAT) 0.4 MG SL tablet, Place 1 tablet (0.4 mg total) under the tongue every 5 (five) minutes as needed for chest pain., Disp: 90 tablet, Rfl: 3   [START ON 06/18/2023] pantoprazole (PROTONIX) 40 MG tablet, Take 1 tablet (40 mg total) by mouth 2 (two) times daily., Disp: , Rfl:    vitamin B-12 (CYANOCOBALAMIN) 1000 MCG tablet, Take 1,000 mcg by mouth daily., Disp: , Rfl:    Review of Systems Review of Systems  Constitutional:  Negative for chills and fever.  Respiratory:  Negative for shortness of breath.   Cardiovascular:  Negative for chest pain.  Gastrointestinal:  Positive for abdominal pain. Negative for blood in stool, constipation, diarrhea, melena,  nausea and vomiting.  Neurological:  Negative for dizziness, weakness and headaches.       Objective:    Vitals BP 128/74   Pulse 85   Temp 98.1 F (36.7 C)   Resp 16   Ht 5\' 9"  (1.753 m)   Wt 177 lb 6.4 oz (80.5 kg)   SpO2 98%   BMI 26.20 kg/m    Physical Examination Physical Exam Constitutional:      Appearance: Normal appearance. He is not ill-appearing.  Cardiovascular:     Rate and Rhythm: Normal rate and regular  rhythm.     Pulses: Normal pulses.     Heart sounds: No murmur heard.    No friction rub. No gallop.  Pulmonary:     Effort: Pulmonary effort is normal. No respiratory distress.     Breath sounds: No wheezing, rhonchi or rales.  Abdominal:     General: Abdomen is flat. Bowel sounds are normal. There is no distension.     Palpations: Abdomen is soft.     Tenderness: There is no abdominal tenderness.  Musculoskeletal:     Right lower leg: No edema.     Left lower leg: No edema.  Skin:    General: Skin is warm and dry.     Findings: No rash.  Neurological:     Mental Status: He is alert.        Assessment & Plan:   Malignant neoplasm of head of pancreas [facility]) The patient states he had some epigastric pain after the EUS with biopsy.  GI gave him a GI cocktail which really helped and he is requesting a refill.  I have reviewed his scans, pathology and notes.  He will followup with surgical oncology and oncology as well.  It sounds like he is not intersted in any chemotherapy.  He will discuss this once all his staging is completed.  Inadequately controlled diabetes mellitus (HCC) He is on novolog 5 Units TID with meals but states he forgets to take this. I am going to hold the novolog and start him on lantus 7 units daily and have him to continue to check his FSBS 2-3 times per day.    Return in about 4 weeks (around 07/08/2023).   Crist Fat, MD

## 2023-06-10 NOTE — Assessment & Plan Note (Signed)
He is on novolog 5 Units TID with meals but states he forgets to take this. I am going to hold the novolog and start him on lantus 7 units daily and have him to continue to check his FSBS 2-3 times per day.

## 2023-06-10 NOTE — Assessment & Plan Note (Signed)
The patient states he had some epigastric pain after the EUS with biopsy.  GI gave him a GI cocktail which really helped and he is requesting a refill.  I have reviewed his scans, pathology and notes.  He will followup with surgical oncology and oncology as well.  It sounds like he is not intersted in any chemotherapy.  He will discuss this once all his staging is completed.

## 2023-06-13 ENCOUNTER — Ambulatory Visit: Payer: Medicare Other | Admitting: Internal Medicine

## 2023-06-13 ENCOUNTER — Other Ambulatory Visit: Payer: Self-pay | Admitting: Genetic Counselor

## 2023-06-13 DIAGNOSIS — Z1379 Encounter for other screening for genetic and chromosomal anomalies: Secondary | ICD-10-CM

## 2023-06-14 ENCOUNTER — Encounter: Payer: Self-pay | Admitting: Nurse Practitioner

## 2023-06-14 ENCOUNTER — Encounter: Payer: Self-pay | Admitting: Internal Medicine

## 2023-06-14 ENCOUNTER — Inpatient Hospital Stay: Payer: Medicare Other | Attending: Nurse Practitioner | Admitting: Nurse Practitioner

## 2023-06-14 ENCOUNTER — Inpatient Hospital Stay: Payer: Medicare Other

## 2023-06-14 VITALS — BP 116/67 | HR 70 | Temp 98.2°F | Resp 17 | Ht 69.0 in | Wt 176.9 lb

## 2023-06-14 DIAGNOSIS — N400 Enlarged prostate without lower urinary tract symptoms: Secondary | ICD-10-CM | POA: Insufficient documentation

## 2023-06-14 DIAGNOSIS — R7989 Other specified abnormal findings of blood chemistry: Secondary | ICD-10-CM | POA: Diagnosis not present

## 2023-06-14 DIAGNOSIS — Z8719 Personal history of other diseases of the digestive system: Secondary | ICD-10-CM | POA: Diagnosis not present

## 2023-06-14 DIAGNOSIS — Z8507 Personal history of malignant neoplasm of pancreas: Secondary | ICD-10-CM | POA: Diagnosis not present

## 2023-06-14 DIAGNOSIS — K760 Fatty (change of) liver, not elsewhere classified: Secondary | ICD-10-CM | POA: Diagnosis not present

## 2023-06-14 DIAGNOSIS — M1990 Unspecified osteoarthritis, unspecified site: Secondary | ICD-10-CM | POA: Diagnosis not present

## 2023-06-14 DIAGNOSIS — C250 Malignant neoplasm of head of pancreas: Secondary | ICD-10-CM | POA: Insufficient documentation

## 2023-06-14 DIAGNOSIS — K5900 Constipation, unspecified: Secondary | ICD-10-CM | POA: Diagnosis not present

## 2023-06-14 DIAGNOSIS — K429 Umbilical hernia without obstruction or gangrene: Secondary | ICD-10-CM | POA: Insufficient documentation

## 2023-06-14 DIAGNOSIS — R079 Chest pain, unspecified: Secondary | ICD-10-CM | POA: Insufficient documentation

## 2023-06-14 DIAGNOSIS — I252 Old myocardial infarction: Secondary | ICD-10-CM | POA: Insufficient documentation

## 2023-06-14 DIAGNOSIS — K831 Obstruction of bile duct: Secondary | ICD-10-CM | POA: Diagnosis not present

## 2023-06-14 DIAGNOSIS — E119 Type 2 diabetes mellitus without complications: Secondary | ICD-10-CM | POA: Insufficient documentation

## 2023-06-14 DIAGNOSIS — R1012 Left upper quadrant pain: Secondary | ICD-10-CM | POA: Insufficient documentation

## 2023-06-14 DIAGNOSIS — Z794 Long term (current) use of insulin: Secondary | ICD-10-CM | POA: Insufficient documentation

## 2023-06-14 DIAGNOSIS — Z860100 Personal history of colon polyps, unspecified: Secondary | ICD-10-CM | POA: Insufficient documentation

## 2023-06-14 DIAGNOSIS — Z809 Family history of malignant neoplasm, unspecified: Secondary | ICD-10-CM | POA: Diagnosis not present

## 2023-06-14 DIAGNOSIS — I700 Atherosclerosis of aorta: Secondary | ICD-10-CM | POA: Diagnosis not present

## 2023-06-14 DIAGNOSIS — Z79899 Other long term (current) drug therapy: Secondary | ICD-10-CM | POA: Diagnosis not present

## 2023-06-14 DIAGNOSIS — Z8249 Family history of ischemic heart disease and other diseases of the circulatory system: Secondary | ICD-10-CM | POA: Diagnosis not present

## 2023-06-14 DIAGNOSIS — R918 Other nonspecific abnormal finding of lung field: Secondary | ICD-10-CM | POA: Diagnosis not present

## 2023-06-14 DIAGNOSIS — Z87891 Personal history of nicotine dependence: Secondary | ICD-10-CM | POA: Insufficient documentation

## 2023-06-14 DIAGNOSIS — E7800 Pure hypercholesterolemia, unspecified: Secondary | ICD-10-CM | POA: Insufficient documentation

## 2023-06-14 DIAGNOSIS — Z1379 Encounter for other screening for genetic and chromosomal anomalies: Secondary | ICD-10-CM

## 2023-06-14 DIAGNOSIS — K828 Other specified diseases of gallbladder: Secondary | ICD-10-CM | POA: Diagnosis not present

## 2023-06-14 LAB — CBC WITH DIFFERENTIAL (CANCER CENTER ONLY)
Abs Immature Granulocytes: 0.02 10*3/uL (ref 0.00–0.07)
Basophils Absolute: 0.1 10*3/uL (ref 0.0–0.1)
Basophils Relative: 1 %
Eosinophils Absolute: 0.3 10*3/uL (ref 0.0–0.5)
Eosinophils Relative: 4 %
HCT: 38.9 % — ABNORMAL LOW (ref 39.0–52.0)
Hemoglobin: 12.9 g/dL — ABNORMAL LOW (ref 13.0–17.0)
Immature Granulocytes: 0 %
Lymphocytes Relative: 24 %
Lymphs Abs: 1.8 10*3/uL (ref 0.7–4.0)
MCH: 30.6 pg (ref 26.0–34.0)
MCHC: 33.2 g/dL (ref 30.0–36.0)
MCV: 92.4 fL (ref 80.0–100.0)
Monocytes Absolute: 0.5 10*3/uL (ref 0.1–1.0)
Monocytes Relative: 7 %
Neutro Abs: 5 10*3/uL (ref 1.7–7.7)
Neutrophils Relative %: 64 %
Platelet Count: 229 10*3/uL (ref 150–400)
RBC: 4.21 MIL/uL — ABNORMAL LOW (ref 4.22–5.81)
RDW: 12.7 % (ref 11.5–15.5)
WBC Count: 7.7 10*3/uL (ref 4.0–10.5)
nRBC: 0 % (ref 0.0–0.2)

## 2023-06-14 LAB — CMP (CANCER CENTER ONLY)
ALT: 195 U/L — ABNORMAL HIGH (ref 0–44)
AST: 62 U/L — ABNORMAL HIGH (ref 15–41)
Albumin: 4 g/dL (ref 3.5–5.0)
Alkaline Phosphatase: 173 U/L — ABNORMAL HIGH (ref 38–126)
Anion gap: 5 (ref 5–15)
BUN: 16 mg/dL (ref 8–23)
CO2: 30 mmol/L (ref 22–32)
Calcium: 9.7 mg/dL (ref 8.9–10.3)
Chloride: 99 mmol/L (ref 98–111)
Creatinine: 1.09 mg/dL (ref 0.61–1.24)
GFR, Estimated: >60 mL/min (ref >60)
Glucose, Bld: 312 mg/dL — ABNORMAL HIGH (ref 70–99)
Potassium: 5.1 mmol/L (ref 3.5–5.1)
Sodium: 134 mmol/L — ABNORMAL LOW (ref 135–145)
Total Bilirubin: 0.5 mg/dL (ref 0.3–1.2)
Total Protein: 6.9 g/dL (ref 6.5–8.1)

## 2023-06-14 LAB — GENETIC SCREENING ORDER

## 2023-06-14 NOTE — Progress Notes (Cosign Needed)
[facility]   [phone] [phone]   Clinic New Consult Note   Patient Care Team: Leonia Reader, Barbara Cower, MD as PCP - General (Internal Medicine) Jake Bathe, MD as PCP - Cardiology (Cardiology) 06/14/2023  CHIEF COMPLAINTS/PURPOSE OF CONSULTATION:  Pancreatic cancer, referred by Dr. Meridee Score  HISTORY OF PRESENTING ILLNESS:  Karl Alvarez 67 y.o. male is here because of pancreatic cancer.  He developed epigastric and abdominal pain in 03/2023 and transaminitis 05/12/2023 with elevated AST/ALT and alk phos. An outside abdominal ultrasound 05/23/2023 showed ill-defined soft tissue at the gallbladder fossa. Abdominal MRI 05/30/2023 showed mild CBD dilatation and a small hypoenhancing pancreatic head mass measuring 1.6 x 1.3 cm.  No evidence of lymphadenopathy or metastatic disease in the abdomen.  EUS by Dr. Meridee Score 06/02/2023 showed a T2 N0 lesion in the pancreatic head, path showed adenocarcinoma.  CA 19-9 is elevated, 182.  Staging CT CAP shows the known pancreatic head mass with 180 degree abutment of the gastroduodenal artery, no involvement with the celiac, SMA or distant metastasis.  He is scheduled to meet pancreatobiliary surgeon Dr. Freida Busman on 06/20/2023.  Socially, he is married with 3 sons, retired from [employer] and independent with ADLs.  Denies alcohol or drug use, smoked cigarettes for 5 years in his teens to early 20s, currently chews tobacco.  He was active before hand surgery in July that began the workup for his diagnosis.  Family history significant for uncle on maternal and paternal sides, metastatic lung to brain but does not know the other type.   Today presents with his son.  He has epigastric pain that radiates to left upper quadrant and more recently around to his back.  He has postprandial bloating, eating small, bland foods and recently switched to soft/liquids.  He has constipation and 10 pounds weight loss since May.  Denies  signs of jaundice..  Denies fever, chills, dyspnea, leg edema.  On chronic pain medication for arthritis and multiple orthopedic surgeries.    MEDICAL HISTORY:  Past Medical History:  Diagnosis Date   Anxiety    CAD (coronary artery disease) 08/2014   Inferior STEMI with PCI/DES to RCA - well preserved LV function   Colon polyps    Diabetes (HCC)    DJD (degenerative joint disease)    Gastric polyps    History of BPH    HLD (hyperlipidemia)    HTN (hypertension)    Internal hemorrhoids    Mitral regurgitation    Osteoarthritis    Overweight    Pure hypercholesterolemia 08/18/2014   Salmonella gastroenteritis     SURGICAL HISTORY: Past Surgical History:  Procedure Laterality Date   BIOPSY  06/02/2023   Procedure: BIOPSY;  Surgeon: Lemar Lofty., MD;  Location: [facility];  Service: Gastroenterology;;   ESOPHAGOGASTRODUODENOSCOPY (EGD) WITH PROPOFOL N/A 06/02/2023   Procedure: ESOPHAGOGASTRODUODENOSCOPY (EGD) WITH PROPOFOL;  Surgeon: Lemar Lofty., MD;  Location: [facility];  Service: Gastroenterology;  Laterality: N/A;   EUS N/A 06/02/2023   Procedure: UPPER ENDOSCOPIC ULTRASOUND (EUS) LINEAR;  Surgeon: Lemar Lofty., MD;  Location: [facility];  Service: Gastroenterology;  Laterality: N/A;   FINE NEEDLE ASPIRATION N/A 06/02/2023   Procedure: FINE NEEDLE ASPIRATION (FNA) LINEAR;  Surgeon: Lemar Lofty., MD;  Location: [facility];  Service: Gastroenterology;  Laterality: N/A;   LEFT HEART CATHETERIZATION WITH CORONARY ANGIOGRAM N/A 08/14/2014   Procedure: LEFT HEART CATHETERIZATION WITH CORONARY ANGIOGRAM;  Surgeon: Iran Ouch, MD;  Location: [facility];  Service: Cardiovascular;  Laterality: N/A;   THUMB ARTHROSCOPY  03/26/2023    SOCIAL HISTORY: Social History   Socioeconomic History   Marital status: Married    Spouse name: Not on file   Number of children: Not on file   Years of education: Not on file   Highest  education level: Not on file  Occupational History   Not on file  Tobacco Use   Smoking status: Former   Smokeless tobacco: Current    Types: Chew  Vaping Use   Vaping status: Never Used  Substance and Sexual Activity   Alcohol use: Not on file   Drug use: Not on file   Sexual activity: Not on file  Other Topics Concern   Not on file  Social History Narrative   Not on file   Social Determinants of Health   Financial Resource Strain: Low Risk  (12/25/2019)   Overall Financial Resource Strain (CARDIA)    Difficulty of Paying Living Expenses: Not hard at all  Food Insecurity: Low Risk  (05/12/2023)   Received from [facility]   Hunger Vital Sign    Worried About Running Out of Food in the Last Year: Never true    Ran Out of Food in the Last Year: Never true  Transportation Needs: No Transportation Needs (05/12/2023)   Received from [facility]    In the past 12 months, has lack of reliable transportation kept you from medical appointments, meetings, work or from getting things needed for daily living? : No  Physical Activity: Insufficiently Active (12/25/2019)   Exercise Vital Sign    Days of Exercise per Week: 7 days    Minutes of Exercise per Session: 20 min  Stress: Stress Concern Present (12/25/2019)   [employer] of Occupational Health - Occupational Stress Questionnaire    Feeling of Stress : To some extent  Social Connections: Not on file  Intimate Partner Violence: Not on file    FAMILY HISTORY: Family History  Problem Relation Age of Onset   Hypertension Mother    Valvular heart disease Mother    Hypertension Brother    Cancer Maternal Uncle    Cancer Paternal Uncle     ALLERGIES:  is allergic to altace [ramipril].  MEDICATIONS:  Current Outpatient Medications  Medication Sig Dispense Refill   atorvastatin (LIPITOR) 40 MG tablet TAKE 1 TABLET BY MOUTH DAILY 90 tablet 3   carvedilol (COREG) 6.25 MG tablet TAKE 1 TABLET BY MOUTH TWICE   DAILY 180 tablet 3   ferrous sulfate 325 (65 FE) MG tablet Take 325 mg by mouth daily with breakfast.     gabapentin (NEURONTIN) 300 MG capsule Take 300 mg by mouth 3 (three) times daily. Two tablets in the morning and 3 tablets in the evening and one tablet at bedtime.     GI Cocktail (alum & mag hydroxide, lidocaine, dicyclomine) oral mixture Take 30 mLs by mouth 4 (four) times daily. Swish and swallow 550 mL 0   insulin aspart (NOVOLOG FLEXPEN) 100 UNIT/ML FlexPen Inject 5 Units into the skin 3 (three) times daily with meals. Take 20 minutes before each meal. 15 mL 11   insulin glargine (LANTUS) 100 unit/mL SOPN Inject 7 Units into the skin daily. 3 mL 11   losartan (COZAAR) 50 MG tablet Take 50 mg by mouth daily.     metFORMIN (GLUCOPHAGE-XR) 500 MG 24 hr tablet Take 2 tablets (1,000 mg total) by mouth 2 (two) times daily with a  meal.     multivitamin (ONE-A-DAY MEN'S) TABS tablet Take 1 tablet by mouth daily.     naloxone (NARCAN) nasal spray 4 mg/0.1 mL as directed. Over dose     [START ON 06/18/2023] pantoprazole (PROTONIX) 40 MG tablet Take 1 tablet (40 mg total) by mouth 2 (two) times daily.     vitamin B-12 (CYANOCOBALAMIN) 1000 MCG tablet Take 1,000 mcg by mouth daily.     nitroGLYCERIN (NITROSTAT) 0.4 MG SL tablet Place 1 tablet (0.4 mg total) under the tongue every 5 (five) minutes as needed for chest pain. 90 tablet 3   No current facility-administered medications for this visit.    REVIEW OF SYSTEMS:   Constitutional: Denies fevers, chills or abnormal night sweats (+) 10 lbs weight loss Eyes: Denies blurriness of vision, double vision or watery eyes Ears, nose, mouth, throat, and face: Denies mucositis or sore throat Respiratory: Denies cough, dyspnea or wheezes Cardiovascular: Denies palpitation, chest discomfort or lower extremity swelling Gastrointestinal:  Denies nausea, heartburn or change in bowel habits (+) constipation (+) post prandial bloating (+) Epigastric pain  radiating to LUQ/back Skin: Denies abnormal skin rashes Lymphatics: Denies new lymphadenopathy or easy bruising Neurological:Denies numbness, tingling or new weaknesses Behavioral/Psych: Mood is stable, no new changes  All other systems were reviewed with the patient and are negative.  PHYSICAL EXAMINATION: ECOG PERFORMANCE STATUS: 1 - Symptomatic but completely ambulatory  Vitals:   06/14/23 1221  BP: 116/67  Pulse: 70  Resp: 17  Temp: 98.2 F (36.8 C)  SpO2: 100%   Filed Weights   06/14/23 1221  Weight: 176 lb 14.4 oz (80.2 kg)    GENERAL:alert, no distress and comfortable SKIN: no rashes or significant lesions EYES: sclera clear NECK: Without mass LYMPH:  no palpable lymphadenopathy LUNGS: clear to auscultation, normal breathing effort HEART: regular rate & rhythm, no lower extremity edema ABDOMEN:abdomen soft, non-tender and normal bowel sounds Musculoskeletal:no cyanosis of digits and no clubbing  PSYCH: alert & oriented x 3 with fluent speech NEURO: no focal motor/sensory deficits  LABORATORY DATA:  I have reviewed the data as listed    Latest Ref Rng & Units 05/30/2023    2:14 PM 09/08/2017    1:02 PM 11/09/2015    2:07 PM  CBC  WBC 4.0 - 10.5 K/uL 9.0  17.0  6.1   Hemoglobin 13.0 - 17.0 g/dL 16.1  09.6  04.5   Hematocrit 39.0 - 52.0 % 46.3  40.1  40.8   Platelets 150.0 - 400.0 K/uL 359.0  229  211       Latest Ref Rng & Units 05/30/2023    2:14 PM 05/20/2023    9:53 AM 10/07/2020    2:50 PM  CMP  Glucose 70 - 99 mg/dL 409  811  914   BUN 6 - 23 mg/dL 24  16  10    Creatinine 0.40 - 1.50 mg/dL 7.82  9.56  2.13   Sodium 135 - 145 mEq/L 132  139  139   Potassium 3.5 - 5.1 mEq/L 5.5  4.2  4.6   Chloride 96 - 112 mEq/L 95  99  98   CO2 19 - 32 mEq/L 29  24  26    Calcium 8.4 - 10.5 mg/dL 08.6  9.8  9.7   Total Protein 6.0 - 8.3 g/dL 7.8  6.7    Total Bilirubin 0.2 - 1.2 mg/dL 0.7  0.6    Alkaline Phos 39 - 117 U/L 146  279  AST 0 - 37 U/L 37  127    ALT 0  - 53 U/L 86  331       RADIOGRAPHIC STUDIES: I have personally reviewed the radiological images as listed and agreed with the findings in the report. CT CHEST ABDOMEN PELVIS W CONTRAST  Result Date: 06/14/2023 CLINICAL DATA:  Pancreatic cancer staging; * Tracking Code: BO * EXAM: CT CHEST, ABDOMEN, AND PELVIS WITH CONTRAST TECHNIQUE: Multidetector CT imaging of the chest, abdomen and pelvis was performed following the standard protocol during bolus administration of intravenous contrast. RADIATION DOSE REDUCTION: This exam was performed according to the departmental dose-optimization program which includes automated exposure control, adjustment of the mA and/or kV according to patient size and/or use of iterative reconstruction technique. CONTRAST:  OMNIPAQUE IOHEXOL 300 MG/ML  SOLN COMPARISON:  MRI abdomen dated May 30, 2023 FINDINGS: CT CHEST FINDINGS Cardiovascular: Normal heart size. No No pericardial effusion. Normal caliber thoracic aorta with mild atherosclerotic disease. Severe coronary artery calcifications. Mediastinum/Nodes: Esophagus and thyroid are unremarkable. No No enlarged lymph nodes seen in the chest. Lungs/Pleura: Central airways are patent. No consolidation, pleural effusion or pneumothorax. Tiny calcified pulmonary nodules, likely sequela of prior granulomatous infection. Perifissural nodule of the right lower lobe measuring 3 mm, likely an intrapulmonary lymph node. No suspicious pulmonary nodules. Musculoskeletal: No chest wall mass or suspicious bone lesions identified. CT ABDOMEN PELVIS FINDINGS Hepatobiliary: No focal liver abnormality is seen. Normal appearance of the gallbladder. Unchanged mild biliary ductal dilation. Pancreas: Ill-defined soft tissue of the pancreatic head measuring 1.6 x 1.3 cm on series 2, image 30, unchanged when compared with the prior MRI. 180 degree abutment of the gastroduodenal artery. No involvement of the celiac or SMA. No evidence of  portal vein involvement. Spleen: Normal in size without focal abnormality. Adrenals/Urinary Tract: Bilateral adrenal glands are unremarkable. No hydronephrosis or nephrolithiasis. Multiple bilateral renal cysts, see recently performed abdominal MRI for complete characterization. Bladder is unremarkable. Stomach/Bowel: Stomach is within normal limits. Appendix appears normal. No evidence of bowel wall thickening, distention, or inflammatory changes. Vascular/Lymphatic: Aortic atherosclerosis. No enlarged abdominal or pelvic lymph nodes. Reproductive: Mild prostatomegaly. Other: Tiny fat containing umbilical hernia. No abdominopelvic ascites. Musculoskeletal: No acute or significant osseous findings. IMPRESSION: 1. Pancreatic head mass with 180 degree abutment of the gastroduodenal artery. No involvement of the celiac or SMA. No evidence of portal vein involvement. 2. Unchanged mild biliary ductal dilation secondary to common bile duct obstruction at the level of the pancreatic head. 3. No evidence of metastatic disease in the chest, abdomen or pelvis. 4. Aortic Atherosclerosis (ICD10-I70.0). Electronically Signed   By: Allegra Lai M.D.   On: 06/14/2023 11:58   DG Abd Portable 2V  Result Date: 06/02/2023 CLINICAL DATA:  Chest pain. EXAM: PORTABLE ABDOMEN - 2 VIEW COMPARISON:  May 20, 2023. FINDINGS: No abnormal bowel dilatation. Mild amount of stool is noted throughout the colon. There is no evidence of free air. Phleboliths are noted in the pelvis. IMPRESSION: No abnormal bowel dilatation.  Mild stool burden. Electronically Signed   By: Lupita Raider M.D.   On: 06/02/2023 16:16   DG CHEST PORT 1 VIEW  Result Date: 06/02/2023 CLINICAL DATA:  Chest pain. EXAM: PORTABLE CHEST 1 VIEW COMPARISON:  September 08, 2017. FINDINGS: The heart size and mediastinal contours are within normal limits. Right lung is clear. Minimal left basilar subsegmental atelectasis or scarring is noted. The visualized skeletal  structures are unremarkable. IMPRESSION: Minimal left basilar subsegmental atelectasis or  scarring. Electronically Signed   By: Lupita Raider M.D.   On: 06/02/2023 16:14   MR ABDOMEN MRCP W WO CONTAST  Result Date: 05/30/2023 CLINICAL DATA:  Elevated LFTs, abnormal ultrasound, dilated CBD EXAM: MRI ABDOMEN WITHOUT AND WITH CONTRAST (INCLUDING MRCP) TECHNIQUE: Multiplanar multisequence MR imaging of the abdomen was performed both before and after the administration of intravenous contrast. Heavily T2-weighted images of the biliary and pancreatic ducts were obtained, and three-dimensional MRCP images were rendered by post processing. CONTRAST:  7.13mL GADAVIST GADOBUTROL 1 MMOL/ML IV SOLN COMPARISON:  Abdominal ultrasound, 05/23/2023 CT abdomen pelvis, 08/27/2013 FINDINGS: Lower chest: No acute abnormality. Hepatobiliary: No solid liver abnormality is seen. Hepatic steatosis. Mildly distended gallbladder. No gallstones. Mild common bile duct dilatation, measuring up to 1.0 cm in caliber. Intrahepatic biliary ductal dilatation. The duct is obstructed within the pancreatic head. Pancreas: Small hypoenhancing, diffusion restricting mass within the ventral pancreatic head measuring 1.6 x 1.3 cm (series 17, image 44). The pancreatic duct is effaced just above the ampulla. There is however no pancreatic ductal dilatation or surrounding inflammatory changes. Spleen: Normal in size without significant abnormality. Adrenals/Urinary Tract: Adrenal glands are unremarkable. Multiple simple, benign and hemorrhagic or proteinaceous bilateral renal cortical cysts, for which no further follow-up or characterization is required. Kidneys are otherwise normal, without renal calculi, solid lesion, or hydronephrosis. Stomach/Bowel: Stomach is within normal limits. No evidence of bowel wall thickening, distention, or inflammatory changes. Vascular/Lymphatic: Aortic atherosclerosis. No enlarged abdominal lymph nodes. Other: No  abdominal wall hernia or abnormality. No ascites. Musculoskeletal: No acute or significant osseous findings. IMPRESSION: 1. Small hypoenhancing, diffusion restricting mass within the ventral pancreatic head measuring 1.6 x 1.3 cm, highly suspicious for pancreatic adenocarcinoma. 2. Mild common bile duct dilatation, measuring up to 1.0 cm in caliber, as well as mild intrahepatic biliary ductal dilatation. Common bile duct is obstructed within the pancreatic head by mass above. 3. The pancreatic duct is effaced just above the ampulla. There is however no pancreatic ductal dilatation or surrounding inflammatory changes. 4. No evidence of lymphadenopathy or metastatic disease in the abdomen. 5. Hepatic steatosis. These results will be called to the ordering clinician or representative by the Radiologist Assistant, and communication documented in the PACS or [employer]. Electronically Signed   By: Jearld Lesch M.D.   On: 05/30/2023 20:33   MR 3D Recon At Scanner  Result Date: 05/30/2023 CLINICAL DATA:  Elevated LFTs, abnormal ultrasound, dilated CBD EXAM: MRI ABDOMEN WITHOUT AND WITH CONTRAST (INCLUDING MRCP) TECHNIQUE: Multiplanar multisequence MR imaging of the abdomen was performed both before and after the administration of intravenous contrast. Heavily T2-weighted images of the biliary and pancreatic ducts were obtained, and three-dimensional MRCP images were rendered by post processing. CONTRAST:  7.59mL GADAVIST GADOBUTROL 1 MMOL/ML IV SOLN COMPARISON:  Abdominal ultrasound, 05/23/2023 CT abdomen pelvis, 08/27/2013 FINDINGS: Lower chest: No acute abnormality. Hepatobiliary: No solid liver abnormality is seen. Hepatic steatosis. Mildly distended gallbladder. No gallstones. Mild common bile duct dilatation, measuring up to 1.0 cm in caliber. Intrahepatic biliary ductal dilatation. The duct is obstructed within the pancreatic head. Pancreas: Small hypoenhancing, diffusion restricting mass within the ventral  pancreatic head measuring 1.6 x 1.3 cm (series 17, image 44). The pancreatic duct is effaced just above the ampulla. There is however no pancreatic ductal dilatation or surrounding inflammatory changes. Spleen: Normal in size without significant abnormality. Adrenals/Urinary Tract: Adrenal glands are unremarkable. Multiple simple, benign and hemorrhagic or proteinaceous bilateral renal cortical cysts, for which no further follow-up  or characterization is required. Kidneys are otherwise normal, without renal calculi, solid lesion, or hydronephrosis. Stomach/Bowel: Stomach is within normal limits. No evidence of bowel wall thickening, distention, or inflammatory changes. Vascular/Lymphatic: Aortic atherosclerosis. No enlarged abdominal lymph nodes. Other: No abdominal wall hernia or abnormality. No ascites. Musculoskeletal: No acute or significant osseous findings. IMPRESSION: 1. Small hypoenhancing, diffusion restricting mass within the ventral pancreatic head measuring 1.6 x 1.3 cm, highly suspicious for pancreatic adenocarcinoma. 2. Mild common bile duct dilatation, measuring up to 1.0 cm in caliber, as well as mild intrahepatic biliary ductal dilatation. Common bile duct is obstructed within the pancreatic head by mass above. 3. The pancreatic duct is effaced just above the ampulla. There is however no pancreatic ductal dilatation or surrounding inflammatory changes. 4. No evidence of lymphadenopathy or metastatic disease in the abdomen. 5. Hepatic steatosis. These results will be called to the ordering clinician or representative by the Radiologist Assistant, and communication documented in the PACS or [employer]. Electronically Signed   By: Jearld Lesch M.D.   On: 05/30/2023 20:33    ASSESSMENT & PLAN: 67 yo male   Adenocarcinoma of the pancreatic head, cT2N0M0 stage IB -We reviewed his medical record in detail with the patient and his son -He presented with epigastric pain and transaminitis, work  up showed T2N0 localized/early stage pancreatic head adenocarcinoma on EUS, staging CT CAP negative for distant metastasis. CA 19-9 of 182 -We addressed the aggressive nature of pancreatic cancer and high risk of recurrence, even with multimodal approach and complete resection -There is abutment of the gastroduodenal artery, but no other major vascular invasion, this is likely resectable -He is not sure he wants to go through a major/complicated surgery, but agrees to meet Dr. Freida Busman and discuss -We discussed the logistics, rationale, potential risk/SE, and benefit of neoadjuvant and adjuvant chemo, and radiation. He is reluctant to take chemo or try radiation due to others' experiences and his concerns about affects on his heart/health -He understands cure rate of chemoradiation alone is very low, and without surgery, chemo and/or radiation will likely be palliative, to hopefully improve his cancer related symptoms/QOL, prolong is life, and achieve disease control -He was fairly active until past few months when he became symptomatic, but maintains an adequate PS, would likely tolerate cancer treatment well. -We discussed the average prognosis with and without treatment. He asked thoughtful questions -He would like to move forward with genetic testing and meet Dr. Freida Busman while thinking about his options and goals. We offered referral to Dr. Mitzi Hansen to discuss radiation but he would like to wait for test results  -Patient seen with Dr. Mosetta Putt and met GI navigator Gardiner Coins, RN -Phone f/up in 2-3 weeks   Weight loss, epigastric/LUQ pain, post prandial bloating -Secondary to #1 -Reviewed diet/nutrition and possibility of pancreatic enzymes   Family history/genetics  -Maternal and paternal uncles with cancer, one wit metastatic lung to brain -Genetic testing drawn today, results are pending -His son understands testing will be recommended if pt is positive   PLAN: -Endoscopy, path, and imaging  reviewed: T2N0 stage IB pancreatic adeno -Lab today with genetics -Surgical consult 10/14 with Dr. Freida Busman -Will discuss his case in tumor board while results are processing and he considers his options/goals of care -Phone f/up in 2-3 weeks -Pt seen with Dr. Mosetta Putt and GI navigator Gardiner Coins, RN  Orders Placed This Encounter  Procedures   CBC with Differential (Cancer Center Only)    Standing Status:   Future  Standing Expiration Date:   06/13/2024   CMP (Cancer Center only)    Standing Status:   Future    Standing Expiration Date:   06/13/2024   CA 19.9    Standing Status:   Future    Standing Expiration Date:   06/13/2024    All questions were answered. The patient knows to call the clinic with any problems, questions or concerns.      Pollyann Samples, NP 06/14/2023    Addendum I have seen the patient, examined him. I agree with the assessment and and plan and have edited the notes.   67 year old male, with past medical history of diabetes, hypertension, presented with epigastric pain and transaminitis.  I have reviewed his images independently, and discussed that his biopsy results with patient and his son in detail.  Due to the gastroduodenal artery involvement, this is probably borderline resectable pancreatic cancer, I recommend neoadjuvant chemotherapy.  However patient is not a very interested in chemo and or surgery, we discussed the role of radiation.  We reviewed today aggressive nature of pancreatic cancer, and answered his questions about life expectancy with or without treatment.  All questions were answered, patient wants to think about his options, and the plan to meet pancreatic surgeon Dr. Freida Busman next week.  Will review his case in GI tumor board, phone visit in 2 weeks to finalize his plan.  All questions were answered.  Malachy Mood MD 06/14/2023

## 2023-06-15 ENCOUNTER — Telehealth: Payer: Self-pay | Admitting: Nurse Practitioner

## 2023-06-15 ENCOUNTER — Telehealth: Payer: Self-pay | Admitting: Internal Medicine

## 2023-06-15 LAB — CANCER ANTIGEN 19-9: CA 19-9: 246 U/mL — ABNORMAL HIGH (ref 0–35)

## 2023-06-15 NOTE — Telephone Encounter (Signed)
Pt. called saying his sugar was still high with new insulin. Spoke with VE he said tell him incease his lantus 3 units every 3 days until his sugar is under 150. Gave patients instructions he confirmed that he understood, told him to call if he needed [place].

## 2023-06-16 ENCOUNTER — Telehealth: Payer: Self-pay | Admitting: Genetic Counselor

## 2023-06-16 NOTE — Telephone Encounter (Signed)
Blood was drawn for POC genetic testing and [facility]-Expanded Panel was ordered on 06/14/2023.   Lalla Brothers, MS, [facility] Genetic Counselor [email or web address] (P) [phone]

## 2023-06-20 ENCOUNTER — Other Ambulatory Visit: Payer: Self-pay

## 2023-06-20 ENCOUNTER — Telehealth: Payer: Self-pay | Admitting: Hematology

## 2023-06-20 ENCOUNTER — Other Ambulatory Visit: Payer: Self-pay | Admitting: Internal Medicine

## 2023-06-20 DIAGNOSIS — C259 Malignant neoplasm of pancreas, unspecified: Secondary | ICD-10-CM | POA: Diagnosis not present

## 2023-06-20 DIAGNOSIS — C250 Malignant neoplasm of head of pancreas: Secondary | ICD-10-CM

## 2023-06-20 NOTE — Progress Notes (Signed)
I spoke with Mr Matherne.  He is not interested in radiation therapy at this time.  I did explain that if he wanted radiation therapy faducial markers would need to be place.  I told him he would need a EUS to place these.  I told him that Dr Meridee Score would be able to do this at the time he places his biliary stent.  He declined.  He said he wanted to wait to see what the results of the genetic testing showed.

## 2023-06-20 NOTE — Telephone Encounter (Signed)
Inbasket message sent to the [facility] scheduling team to get the patient scheduled.

## 2023-06-22 ENCOUNTER — Other Ambulatory Visit: Payer: Self-pay

## 2023-06-22 NOTE — Progress Notes (Signed)
The proposed treatment discussed in conference is for discussion purpose only and is not a binding recommendation.  The patients have not been physically examined, or presented with their treatment options.  Therefore, final treatment plans cannot be decided.  

## 2023-06-23 LAB — CYTOLOGY - NON PAP

## 2023-06-24 ENCOUNTER — Telehealth: Payer: Self-pay

## 2023-06-24 NOTE — Telephone Encounter (Signed)
His first available is 12/12, I will attach Dr Marina Goodell as well.   Dr Marina Goodell please advise if you have any appts sooner than 12/12.  Thank you

## 2023-06-24 NOTE — Telephone Encounter (Signed)
Karl Alvarez does Dr Marina Goodell have anything sooner?

## 2023-06-24 NOTE — Telephone Encounter (Signed)
I'm not sure but he stated below for [place] to do it in November.

## 2023-06-24 NOTE — Telephone Encounter (Signed)
-----   Message from [facility] sent at 06/24/2023  4:16 AM EDT ----- Regarding: RE: Pancreatic cancer Antoinette Borgwardt or Bonita Quin, Please schedule this patient for ERCP on December 12, if Dr. Marina Goodell has something sooner than you can offer that for them. Thanks. GM ----- Message ----- From: Hilarie Fredrickson, MD Sent: 06/21/2023   4:59 PM EDT To: Fritzi Mandes, MD; Pollyann Samples, NP; # Subject: RE: Pancreatic cancer                          Liz Beach, You can plan on doing him electively in November. If he develops jaundice in the interim, we can assess for possible outpatient availability amongst the biliary endoscopist's, or simply admit as has been done multiple times previous. Thanks, JP ----- Message ----- From: Lemar Lofty., MD Sent: 06/21/2023   4:41 PM EDT To: Fritzi Mandes, MD; Hilarie Fredrickson, MD; # Subject: RE: Pancreatic cancer                          KB, Thanks for this update. Since EUS fiducial will not be needed, then [facility] or I can perform this procedure. My next available will not be until end of November unless emergency occurs or I have cancellations.  JP, What would you like to do in regards to ERCP stenting, do you have earlier availability or you going to wait for liver biochemical testing still to determine timing? Thanks. GM ----- Message ----- From: Marily Lente, RN Sent: 06/20/2023   3:10 PM EDT To: Fritzi Mandes, MD; Pollyann Samples, NP; # Subject: RE: Pancreatic cancer                          He declined radiation therapy at this time.  KB ----- Message ----- From: Malachy Mood, MD Sent: 06/20/2023  12:30 PM EDT To: Fritzi Mandes, MD; Pollyann Samples, NP; # Subject: RE: Pancreatic cancer                          Clydie Braun,  Could you call him to see if he wants to see Dr. Mitzi Hansen to discuss RT now, or want to wait his next appointment with me to discuss further? If he is open to RT, please refer him, maybe he can have ERCP and fiducial placement  by Dr. Meridee Score on same day  [place] ----- Message ----- From: Fritzi Mandes, MD Sent: 06/20/2023  12:18 PM EDT To: Pollyann Samples, NP; Malachy Mood, MD; # Subject: Pancreatic cancer                              Hello all, I just met with Mr. Gaye and we had a long discussion about surgery. He is pretty confident that he does not want surgery. I am not sure he will want chemo or radiation, but I strongly recommended he have a biliary stent to prevent impending jaundice. Liz Beach is this something you would be willing to go ahead and do? He is not clinically jaundiced today but I would imaging it's only a matter of time. Please me know if there is anything else I can do. Thanks, [place]

## 2023-06-27 ENCOUNTER — Ambulatory Visit: Payer: Medicare Other | Admitting: Internal Medicine

## 2023-06-27 ENCOUNTER — Encounter: Payer: Self-pay | Admitting: Internal Medicine

## 2023-06-27 VITALS — BP 118/74 | HR 75 | Temp 98.1°F | Resp 18 | Ht 69.0 in | Wt 172.0 lb

## 2023-06-27 DIAGNOSIS — C259 Malignant neoplasm of pancreas, unspecified: Secondary | ICD-10-CM | POA: Diagnosis not present

## 2023-06-27 DIAGNOSIS — Z0000 Encounter for general adult medical examination without abnormal findings: Secondary | ICD-10-CM

## 2023-06-27 DIAGNOSIS — G894 Chronic pain syndrome: Secondary | ICD-10-CM

## 2023-06-27 DIAGNOSIS — Z6825 Body mass index (BMI) 25.0-25.9, adult: Secondary | ICD-10-CM

## 2023-06-27 DIAGNOSIS — I2510 Atherosclerotic heart disease of native coronary artery without angina pectoris: Secondary | ICD-10-CM | POA: Diagnosis not present

## 2023-06-27 DIAGNOSIS — E7800 Pure hypercholesterolemia, unspecified: Secondary | ICD-10-CM | POA: Diagnosis not present

## 2023-06-27 DIAGNOSIS — K219 Gastro-esophageal reflux disease without esophagitis: Secondary | ICD-10-CM | POA: Insufficient documentation

## 2023-06-27 DIAGNOSIS — E1142 Type 2 diabetes mellitus with diabetic polyneuropathy: Secondary | ICD-10-CM | POA: Diagnosis not present

## 2023-06-27 DIAGNOSIS — I10 Essential (primary) hypertension: Secondary | ICD-10-CM | POA: Diagnosis not present

## 2023-06-27 DIAGNOSIS — N400 Enlarged prostate without lower urinary tract symptoms: Secondary | ICD-10-CM

## 2023-06-27 MED ORDER — LIDOCAINE VISCOUS HCL 2 % MT SOLN: 30.0000 mL | Freq: Four times a day (QID) | OROMUCOSAL | 1 refills | Status: DC

## 2023-06-27 NOTE — Progress Notes (Signed)
Preventive Screening-Counseling & Management    Karl Alvarez is a 67 yo male who comes in today for an annual wellness exam.   He is currently due for screening labs.  His last eye exam was in 02/2023 and he states they are following him for macular degeneration.  He denies any problems with his vision.  His last colonoscopy was done on 08/11/2017 and this showed a normal colon.  However the patient had a prior colonoscopy in 2014 showing multiple adenomatous polyps and they want to repeat his colonoscopy in 2023.  There is no family history of prostate or colorectal cancer.  He denies any urinary symptoms/problems.  The patient does not exercise.  He smoked in his late teens for about 5 years.  He does get yearly flu vaccines.  He had a pneumovax 23 done in 2023.  He states he had a zostavax done yearly ago.  He has had 3 COVID-19 vaccines including 1 booster.  He had There is no family history of MI or strokes.  There is no depression, anxiety or memory loss.  He stopped taking a ASA 81mg  daily about 2 months ago.  Karl Alvarez is a 66 yo male who comes in for followup of his recent diagnosis of pancreatic cancer.  He went in to see GI on 05/30/2023 where had a MRI of his abd/pelvis done on the same day that showed a small hypoenhancing, diffusion restricting mass within the ventral pancreatic head measuring 1.6 x 1.3 cm, highly suspicious for pancreatic adenocarcinoma.  There was mild common bile duct dilatation, measuring up to 1.0 cm in caliber, as well as mild intrahepatic biliary ductal dilatation.  Common bile duct is obstructed within the pancreatic head by mass.  The pancreatic duct is effaced just above the ampulla. There is however no pancreatic ductal dilatation or surrounding inflammatory changes. There was no evidence of lymphadenopathy or metastatic disease in the abdomen.  There was hepatic steatosis.  He did go for an EUS/EGD on 06/02/2023 that showed no lesions in the esophagus ,  stomach, or lesions in the duodenal bulb, in the first portion of the duodenum and in the second portion of the duodenum.  There was a heterogenous lesion with some hypoechogenicity was identified in the pancreatic head, where the CBD and PD began to dilate. The endosonographic appearance was suspicious for adenocarcinoma. This was staged T2 N0 Mx by endosonographic criteria. Fine needle biopsy was performed.  There was dilation in the common bile duct and in the common hepatic duct.  No malignant-appearing lymph nodes were visualized in the celiac region (level 20), peripancreatic region and porta hepatis region.  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  He underwent a CT scan of his abd/pelvis for staging done on 06/09/2023 and this showed a pancreatic head mass with 180 degree abutment of the gastroduodenal artery. No involvement of the celiac or SMA. No evidence of portal vein involvement.  There was unchanged mild biliary ductal dilation secondary to common bile duct obstruction at the level of the pancreatic head.  There was no evidence of metastatic disease in the chest, abdomen or pelvis.  There was aortic atherosclerosis.  He did see oncology on 06/14/2023 where they felt he had adenocarcinoma of the pancreatic head, cT2N0M0 stage IB.  They have done genetic testing on him but he has not heard back on his genetic testing.  They sent him to the hepatobiliary surgeon whom he saw on 06/22/2023.  They recommended a whipple  procedure.  They noted that the patient was very hesitant about having a major surgery.  He was also not interested in radiation or chemo therapy.  The surgeon recommended at  that point that he undergo placement of a biliary stent to prevent the development of obstructive jaundice and cholangitis.  They wanted to talk to GI and Oncology and get back with him about his options.  He still is having abdominal pain but this is controlled on his meds that pain management has given  him.   They have placed him on a trial of MS Contin 15 mg once daily, which he uses at at nighttime. They increased his immediate release oxycodone to 10 mg every 8 hours as needed for severe breakthrough pain.  He states the GI cocktail helps his pain the most and allows him to go to sleep at night.  The patient is not eating solid food over a week now and he is drinking protein shakes.  He denies any nausea or vomiting.  He is having some BRBPR due to bleeding from constipation and difficulty with hard stools.  The patient also has a history of T2 diabetes.  He was initially diagnosed with diabetes in 2015.  He was having elevated blood sugars before his EUS.  I had a discusion with his GI doctors before the procedure and he was not on metformin at that time.  I started him on novolog 5 Units TID before meals.  He states that his FSBS was running near 200 and I added Lantus 7 Units daily and this was uptitrate by 3 Units every 3 days all the way up to Lantus 16 Units daily.  He states it did not bring his FSBS and it started pruritus with him.  He stopped his lantus and metformin last Friday.   He has not restarted his novolog yet.  He has been checking his FSBS since stopping everything last Friday and his FSBS have been running 250.  He saw [name] in 01/2023 where his Hgba1c was 7.4%.  He was previously seen in the time before that were his A1c was 8%.   IToday, he denies any nausea, vomiting, or diarrhea.  He does have complications of pain in his feet with diabetic neuropathy where he is currently gabapentin 600mg  TID.  He has no diabetic nephropathy, retinopathy but he has had coronary artery disease where he had a MI with stent in 2015.  His last dilated yearly diabetic eye exam was done on 02/14/2023 by [facility] and this shows no evidence of diabetic retinopathy.    The patient also has a history of CAD where he had a STEMI in 2015.  He underwent a left heart heart cath where he  underwent a DES to the RCA in 2015.  He has been seen regularly by cardiology with his last visit in 02/02/2023.  They noted earlier in the year that the patient was having heart palpiations in 09/2021 and Zio patch showed PVC's of 7.3%. Dr. Anne Fu increased his carvedilol 25 mg bid and this helped.  They recommended secondary prevention and continued risk factor modification. See PMH for summary of cardiac history and status of revascularization. Her CAD is controlled with therapy as summarized in the medication list and previous notes. The patient has no comorbid conditions. He has the following baseline symptoms: he may get some fatigue but denies any baseline exertional dyspnea, chest pain, or palpitations.  He can go up 4-5 flights of stairs  and he will have to rest.  He has the following modifiable risk factor(s): HTN, DM, hyperlipidemia, and sedentary lifestyle. Specifically denied complaint(s): chest pain, palpitations, orthopnea, edema, exertional dyspnea, and syncope.  The patient had a stress myoview perfusion study done on 12/30/2021 that showed findings consistent with prior myocardial infarction. The study is low risk.  No ST deviation was noted.  LV perfusion is abnormal. There is no evidence of ischemia. There is evidence of infarction. Defect 1: There is a small defect with moderate reduction in uptake present in the basal inferior and inferolateral location(s) that is fixed. There is abnormal wall motion in the defect area. Consistent with infarction.  His Left ventricular function was normal with a nuclear stress EF of 51 %. The left ventricular ejection fraction is mildly decreased (45-54%).   He underwent an ECHO on 01/14/2022 and this showed a LVEF of 55-60% with normal LV function.  The left ventricle demonstrates regional wall motion abnormalities with basal inferior akinesis. Left ventricular diastolic parameters are consistent with Grade I diastolic dysfunction ( impaired relaxation).  The  right ventricular systolic function was normal. The right ventricular size was normal. Tricuspid regurgitation signal is inadequate for assessing PA pressure.  His left atrial size was mildly dilated. There was mild dilatation of the aortic root, measuring 37 mm.   The patient is a 67 year old male who presents for a follow-up evaluation of hypertension.  He was diagnosed with HTN about 30 years ago.  The patient has been checking his blood pressure at home. The patient's blood pressure has ranged systollically 110-120's.  He was on losartan 50mg  daily but over the last few weeks he has noticed his BP has been lower.  He has now been taking his losartan 25mg  every other day.   The patient's current medications include: losartan 50mg  1/2 tab (25mg ) every other day. The patient has been tolerating her medications well. The patient denies any headache, visual changes, dizziness, lightheadness, chest pain, shortness of breath, weakness/numbness, and edema.  She reports there have been no other symptoms noted.  Karl Alvarez returns today for routine followup on her cholesterol. Overall, he states he is doing well and is without any complaints or problems at this time. He specifically denies nausea, vomiting, diarrhea, myalgias, and fatigue.  He remains on atorvastatin 40mg  qhs.  He is fasting in anticipation for labs today.   He also has a history of chronic pain syndrome with chronic lower back pain with left lower extremity radicular symptoms.  He also has a history of osteoarthritis involving his bilateral shoulders and left thumb.  He had shoulder surgery done in 2009 with biceps tendon rupture and subsequent rotator cuff repair.  He states he has been seeing the pain clinic since then.  He also had a tibular fracture back in 2020 where he underwent plates and screws.  He has chronic low back pain with pain in his lumbar spine with pain that radiates to the left thigh, left knee and left foot. The pain be  severe and rated 9/10.  His pain management records report a lumbar spine MRI completed in 2018 with noted disc bulge at L3-L4 without any central canal or foraminal stenosis.  They have him on the above pain medications.      Are there smokers in your home (other than you)? No  Risk Factors Current exercise habits:  as above   Dietary issues discussed: none   Depression Screen (Note: if answer to either  of the following is "Yes", a more complete depression screening is indicated)   Over the past two weeks, have you felt down, depressed or hopeless? Yes  Over the past two weeks, have you felt little interest or pleasure in doing things? Yes  Have you lost interest or pleasure in daily life? Yes  Do you often feel hopeless? Yes  Do you cry easily over simple problems? No  Activities of Daily Living In your present state of health, do you have any difficulty performing the following activities?:  Driving? No Managing money?  No Feeding yourself? No Getting from bed to chair? No Climbing a flight of stairs? No Preparing food and eating?: No Bathing or showering? No Getting dressed: No Getting to the toilet? No Using the toilet:No Moving around from place to place: No In the past year have you fallen or had a near fall?:No   Are you sexually active?  No  Do you have more than one partner?  No  Hearing Difficulties: No Do you often ask people to speak up or repeat themselves? Yes Do you experience ringing or noises in your ears? Yes Do you have difficulty understanding soft or whispered voices? Yes   Do you feel that you have a problem with memory? No  Do you often misplace items? No  Do you feel safe at home?  Yes  Cognitive Testing  Alert? Yes  Normal Appearance?Yes  Oriented to person? Yes  Place? Yes   Time? Yes  Recall of three objects?  Yes  Can perform simple calculations? Yes  Displays appropriate judgment?Yes  Can read the correct time from a watch  face?Yes  Fall Risk Prevention  Any stairs in or around the home? Yes  If so, are there any without handrails? Yes  Home free of loose throw rugs in walkways, pet beds, electrical cords, etc? Yes  Adequate lighting in your home to reduce risk of falls? Yes  Use of a cane, walker or w/c? No    Time Up and Go  Was the test performed? Yes .  Length of time to ambulate 10 feet: 15 sec.   Gait steady and fast without use of assistive device    Advanced Directives have been discussed with the patient? Yes   List the Names of Other Physician/Practitioners you currently use: Patient Care Team: Crist Fat, MD as PCP - General (Internal Medicine) Jake Bathe, MD as PCP - Cardiology (Cardiology)    Past Medical History:  Diagnosis Date   Anxiety    CAD (coronary artery disease) 08/2014   Inferior STEMI with PCI/DES to RCA - well preserved LV function   Colon polyps    Diabetes (HCC)    DJD (degenerative joint disease)    Gastric polyps    History of BPH    HLD (hyperlipidemia)    HTN (hypertension)    Internal hemorrhoids    Mitral regurgitation    Osteoarthritis    Overweight    Pure hypercholesterolemia 08/18/2014   Salmonella gastroenteritis     Past Surgical History:  Procedure Laterality Date   BIOPSY  06/02/2023   Procedure: BIOPSY;  Surgeon: Lemar Lofty., MD;  Location: [facility];  Service: Gastroenterology;;   ESOPHAGOGASTRODUODENOSCOPY (EGD) WITH PROPOFOL N/A 06/02/2023   Procedure: ESOPHAGOGASTRODUODENOSCOPY (EGD) WITH PROPOFOL;  Surgeon: Lemar Lofty., MD;  Location: [facility];  Service: Gastroenterology;  Laterality: N/A;   EUS N/A 06/02/2023   Procedure: UPPER ENDOSCOPIC ULTRASOUND (EUS) LINEAR;  Surgeon: Meridee Score,  Netty Starring., MD;  Location: [facility];  Service: Gastroenterology;  Laterality: N/A;   FINE NEEDLE ASPIRATION N/A 06/02/2023   Procedure: FINE NEEDLE ASPIRATION (FNA) LINEAR;  Surgeon: Lemar Lofty.,  MD;  Location: [facility];  Service: Gastroenterology;  Laterality: N/A;   LEFT HEART CATHETERIZATION WITH CORONARY ANGIOGRAM N/A 08/14/2014   Procedure: LEFT HEART CATHETERIZATION WITH CORONARY ANGIOGRAM;  Surgeon: Iran Ouch, MD;  Location: [facility];  Service: Cardiovascular;  Laterality: N/A;   THUMB ARTHROSCOPY  03/26/2023      Current Medications  Current Outpatient Medications  Medication Sig Dispense Refill   carvedilol (COREG) 6.25 MG tablet TAKE 1 TABLET BY MOUTH TWICE  DAILY 180 tablet 3   ferrous sulfate 325 (65 FE) MG tablet Take 325 mg by mouth daily with breakfast.     gabapentin (NEURONTIN) 300 MG capsule Take 300 mg by mouth 3 (three) times daily. Two tablets in the morning and 3 tablets in the evening and one tablet at bedtime.     GI Cocktail (alum & mag hydroxide, lidocaine, dicyclomine) oral mixture Take 30 mLs by mouth 4 (four) times daily. Swish and swallow 550 mL 1   losartan (COZAAR) 50 MG tablet Take 50 mg by mouth daily.     multivitamin (ONE-A-DAY MEN'S) TABS tablet Take 1 tablet by mouth daily.     naloxone (NARCAN) nasal spray 4 mg/0.1 mL as directed. Over dose     nitroGLYCERIN (NITROSTAT) 0.4 MG SL tablet Place 1 tablet (0.4 mg total) under the tongue every 5 (five) minutes as needed for chest pain. 90 tablet 3   ONETOUCH ULTRA TEST test strip TEST BLOOD SUGAR ONCE DAILY. 50 each 9   pantoprazole (PROTONIX) 40 MG tablet Take 1 tablet (40 mg total) by mouth 2 (two) times daily.     vitamin B-12 (CYANOCOBALAMIN) 1000 MCG tablet Take 1,000 mcg by mouth daily.     No current facility-administered medications for this visit.    Allergies Altace [ramipril]   Social History Social History   Tobacco Use   Smoking status: Former   Smokeless tobacco: Current    Types: Chew  Substance Use Topics   Alcohol use: Not on file     Review of Systems Review of Systems  Constitutional:  Positive for malaise/fatigue. Negative for chills and fever.  Eyes:   Negative for blurred vision and double vision.  Respiratory:  Negative for cough, hemoptysis, shortness of breath and wheezing.   Cardiovascular:  Negative for chest pain, palpitations and leg swelling.  Gastrointestinal:  Positive for abdominal pain, blood in stool and heartburn. Negative for constipation, diarrhea, melena, nausea and vomiting.  Genitourinary:  Positive for frequency. Negative for hematuria.  Musculoskeletal:  Positive for back pain. Negative for myalgias.  Skin:  Positive for itching and rash.       Some rash or the left upper arm  Neurological:  Negative for dizziness, weakness and headaches.  Endo/Heme/Allergies:  Negative for polydipsia.     Physical Exam:      Body mass index is 25.4 kg/m. BP 118/74   Pulse 75   Temp 98.1 F (36.7 C)   Resp 18   Ht 5\' 9"  (1.753 m)   Wt 172 lb (78 kg)   SpO2 97%   BMI 25.40 kg/m   Physical Exam Constitutional:      Appearance: Normal appearance. He is not ill-appearing.  HENT:     Head: Normocephalic and atraumatic.     Right Ear: Tympanic  membrane, ear canal and external ear normal.     Left Ear: Tympanic membrane, ear canal and external ear normal.     Nose: Nose normal. No congestion or rhinorrhea.     Mouth/Throat:     Mouth: Mucous membranes are dry.     Pharynx: Oropharynx is clear. No oropharyngeal exudate or posterior oropharyngeal erythema.  Eyes:     General: No scleral icterus.    Conjunctiva/sclera: Conjunctivae normal.     Pupils: Pupils are equal, round, and reactive to light.  Neck:     Vascular: No carotid bruit.  Cardiovascular:     Rate and Rhythm: Normal rate and regular rhythm.     Pulses: Normal pulses.     Heart sounds: No murmur heard.    No friction rub. No gallop.  Pulmonary:     Effort: Pulmonary effort is normal. No respiratory distress.     Breath sounds: No wheezing, rhonchi or rales.  Abdominal:     General: Abdomen is flat. Bowel sounds are normal. There is no distension.      Palpations: Abdomen is soft.     Tenderness: There is no abdominal tenderness.  Musculoskeletal:     Cervical back: Neck supple. No tenderness.     Right lower leg: No edema.     Left lower leg: No edema.  Lymphadenopathy:     Cervical: No cervical adenopathy.  Skin:    General: Skin is warm and dry.     Findings: No rash.  Neurological:     General: No focal deficit present.     Mental Status: He is alert and oriented to person, place, and time.  Psychiatric:        Mood and Affect: Mood normal.        Behavior: Behavior normal.      Assessment:      Pancreatic adenocarcinoma (HCC)  Coronary artery disease involving native coronary artery of native heart without angina pectoris  Essential hypertension  Hypercholesterolemia  Gastroesophageal reflux disease, unspecified whether esophagitis present  Chronic pain syndrome  Benign prostatic hyperplasia without lower urinary tract symptoms  Diabetic polyneuropathy associated with type 2 diabetes mellitus (HCC)  BMI 25.0-25.9,adult    Plan:     During the course of the visit the patient was educated and counseled about appropriate screening and preventive services including:   Pneumococcal vaccine  Influenza vaccine Colorectal cancer screening Advanced directives: DNR discussed.  Son in the room during exam  Diet review for nutrition referral? Yes ____  Not Indicated __x__   Patient Instructions (the written plan) was given to the patient.  Pancreatic adenocarcinoma [facility]) I had a long discussion with him regarding what the surgeons and the oncologist has said.  He has done his own research and the patient is not interested in surgery or chemo/radiation therapy.  He completely understands that he will pass away from his cancer but wants to have quality of life.  He is to continue on his current pain meds and he is interested in having hospice come out to his home.  Coronary artery disease involving native  coronary artery of native heart without angina pectoris He has a history of MI.  Please see plan in regards to his pancreatic cancer.  I see no reason to check labs today as he wants to go on hospice.  He stopped taking his ASA about 2 months ago.  Essential hypertension We will continue his BP meds for now.  Gastroesophageal reflux disease He  states he does have pain in his epigastric area where he has reflux and he states the only thing that helps his pain at night is a GI cocktail.  I think this is related to his pancreatic cancer but we will refill the GI cocktail since this does give some palliation.  Diabetic polyneuropathy associated with type 2 diabetes mellitus (HCC) I told him at this point that he can stop his insulins and not really do FSBS.  He is to eat what he can.  Benign prostatic hyperplasia without lower urinary tract symptoms He has BPH but no urinary symptoms at this time.  BMI 25.0-25.9,adult His weight is noted.  He going to go on hospice and we will continue to monitor his PO intake and weight.  Chronic pain syndrome He is to continue his pain meds at this time.  Hypercholesterolemia I told him that he does not need any cholesterol meds at this time.    Prevention Health maintenance discussed.  Main goal of care is palliation for his pancreatic cancer.   Medicare Attestation I have personally reviewed: The patient's medical and social history Their use of alcohol, tobacco or illicit drugs Their current medications and supplements The patient's functional ability including ADLs,fall risks, home safety risks, cognitive, and hearing and visual impairment Diet and physical activities Evidence for depression or mood disorders  The patient's weight, height, and BMI have been recorded in the chart.  I have made referrals, counseling, and provided education to the patient based on review of the above and I have provided the patient with a written personalized  care plan for preventive services.     Crist Fat, MD   07/04/2023

## 2023-06-28 ENCOUNTER — Encounter: Payer: Self-pay | Admitting: Genetic Counselor

## 2023-06-28 ENCOUNTER — Telehealth: Payer: Self-pay | Admitting: Genetic Counselor

## 2023-06-28 ENCOUNTER — Other Ambulatory Visit: Payer: Self-pay

## 2023-06-28 DIAGNOSIS — Z1379 Encounter for other screening for genetic and chromosomal anomalies: Secondary | ICD-10-CM | POA: Insufficient documentation

## 2023-06-28 DIAGNOSIS — K869 Disease of pancreas, unspecified: Secondary | ICD-10-CM

## 2023-06-28 DIAGNOSIS — C259 Malignant neoplasm of pancreas, unspecified: Secondary | ICD-10-CM

## 2023-06-28 NOTE — Telephone Encounter (Signed)
ERCP 08/18/23 at 1230 pm at [facility] with [facility]   Left message on machine to call back

## 2023-06-28 NOTE — Telephone Encounter (Signed)
No pathogenic variants were identified in the 71 genes analyzed. We will message Dr. Mosetta Putt on 07/05/2023 and request she disclose the results during their appointment.  The test report has been scanned into EPIC and is located under the Molecular Pathology section of the Results Review tab.  A portion of the result report is included below for reference.   Lalla Brothers, MS, [facility] Genetic Counselor [email or web address] (P) [phone]

## 2023-06-28 NOTE — Telephone Encounter (Signed)
ERCP scheduled, pt instructed and medications reviewed.  Patient instructions mailed to home.  Patient to call with any questions or concerns.  

## 2023-07-04 NOTE — Assessment & Plan Note (Signed)
His weight is noted.  He going to go on hospice and we will continue to monitor his PO intake and weight.

## 2023-07-04 NOTE — Assessment & Plan Note (Signed)
I told him at this point that he can stop his insulins and not really do FSBS.  He is to eat what he can.

## 2023-07-04 NOTE — Assessment & Plan Note (Signed)
He has a history of MI.  Please see plan in regards to his pancreatic cancer.  I see no reason to check labs today as he wants to go on hospice.  He stopped taking his ASA about 2 months ago.

## 2023-07-04 NOTE — Assessment & Plan Note (Signed)
He states he does have pain in his epigastric area where he has reflux and he states the only thing that helps his pain at night is a GI cocktail.  I think this is related to his pancreatic cancer but we will refill the GI cocktail since this does give some palliation.

## 2023-07-04 NOTE — Assessment & Plan Note (Signed)
We will continue his BP meds for now.

## 2023-07-04 NOTE — Assessment & Plan Note (Signed)
I told him that he does not need any cholesterol meds at this time.

## 2023-07-04 NOTE — Assessment & Plan Note (Signed)
He has BPH but no urinary symptoms at this time.

## 2023-07-04 NOTE — Assessment & Plan Note (Signed)
Adenocarcinoma of the pancreatic head, cT2N0M0 stage IB, MMR normal  -He presented with epigastric pain and transaminitis, work up showed T2N0 localized/early stage pancreatic head adenocarcinoma on EUS, staging CT CAP negative for distant metastasis. CA 19-9 of 182 -seen by suergeon dr. Freida Busman and pt declined surgery  -pt declined chemo -I recommend him to consider RT

## 2023-07-04 NOTE — Assessment & Plan Note (Signed)
He is to continue his pain meds at this time.

## 2023-07-04 NOTE — Assessment & Plan Note (Signed)
I had a long discussion with him regarding what the surgeons and the oncologist has said.  He has done his own research and the patient is not interested in surgery or chemo/radiation therapy.  He completely understands that he will pass away from his cancer but wants to have quality of life.  He is to continue on his current pain meds and he is interested in having hospice come out to his home.

## 2023-07-05 ENCOUNTER — Inpatient Hospital Stay: Payer: Medicare Other | Admitting: Hematology

## 2023-07-05 ENCOUNTER — Encounter: Payer: Self-pay | Admitting: Genetic Counselor

## 2023-07-05 DIAGNOSIS — C250 Malignant neoplasm of head of pancreas: Secondary | ICD-10-CM

## 2023-07-05 NOTE — Progress Notes (Signed)
Patient is scheduled for a phone visit today, I called him but no answer.  I called his wife, she informed that the patient has enrolled to hospice program by his primary care physician.  He is not interested in any cancer treatment.  I informed his wife that his genetic testing was negative.  I also told her that we will continue to support him, and call us if anything we can help him.  She voiced a good understanding and appreciative the phone call.  Malachy Mood  07/05/2023

## 2023-07-25 ENCOUNTER — Ambulatory Visit: Payer: Medicare Other | Admitting: Internal Medicine

## 2023-07-25 ENCOUNTER — Telehealth: Payer: Self-pay

## 2023-07-25 NOTE — Telephone Encounter (Signed)
I am sorry to hear this. FYI on your patient JP and [facility].

## 2023-07-25 NOTE — Telephone Encounter (Signed)
Left message on machine to call back  

## 2023-07-25 NOTE — Telephone Encounter (Signed)
-----   Message from [facility] sent at 07/22/2023  5:37 AM EST ----- Regarding: Followup Karl Alvarez or Covering RN, We need to find out if this patient is going to want any further procedures as he has elected to forego any chemotherapy or surgery for his diagnosed pancreatic cancer. He is scheduled for ERCP in December, but he may not want any other procedures. Please help understand their thoughts/desires. Also he needs updated LFTs, to see if he really needs stent if he is not overtly jaundiced. Need to know before his procedure in December. Thanks. GM

## 2023-07-25 NOTE — Telephone Encounter (Signed)
I spoke with the pt and he states he is not interested in any procedures or labs. He wants to live what is left of his life.  I have cancelled the procedure.

## 2023-07-27 ENCOUNTER — Ambulatory Visit: Payer: Medicare Other | Admitting: Internal Medicine

## 2023-07-29 ENCOUNTER — Ambulatory Visit: Payer: Medicare Other | Admitting: Internal Medicine

## 2023-08-18 ENCOUNTER — Encounter (HOSPITAL_COMMUNITY): Payer: Self-pay

## 2023-08-18 ENCOUNTER — Ambulatory Visit (HOSPITAL_COMMUNITY): Admit: 2023-08-18 | Payer: Medicare Other | Admitting: Gastroenterology

## 2023-08-18 SURGERY — ENDOSCOPIC RETROGRADE CHOLANGIOPANCREATOGRAPHY (ERCP) WITH PROPOFOL
Anesthesia: General

## 2023-09-07 DEATH — deceased

## 2023-12-14 ENCOUNTER — Telehealth: Payer: Self-pay | Admitting: Cardiology

## 2023-12-14 NOTE — Telephone Encounter (Signed)
 Wife (Tammy) called to let Dr. Anne Fu know patient died of pancreatic cancer.
